# Patient Record
Sex: Female | Born: 1980 | Race: Black or African American | Hispanic: No | Marital: Single | Smoking: Former smoker
Health system: Southern US, Community
[De-identification: ages and names within clinical notes are randomized; demographics above are authoritative.]

## PROBLEM LIST (undated history)

## (undated) DIAGNOSIS — M1990 Unspecified osteoarthritis, unspecified site: Secondary | ICD-10-CM

## (undated) DIAGNOSIS — E559 Vitamin D deficiency, unspecified: Secondary | ICD-10-CM

## (undated) DIAGNOSIS — F32A Depression, unspecified: Secondary | ICD-10-CM

## (undated) DIAGNOSIS — N63 Unspecified lump in unspecified breast: Secondary | ICD-10-CM

## (undated) DIAGNOSIS — Z789 Other specified health status: Secondary | ICD-10-CM

## (undated) DIAGNOSIS — F419 Anxiety disorder, unspecified: Secondary | ICD-10-CM

## (undated) DIAGNOSIS — K259 Gastric ulcer, unspecified as acute or chronic, without hemorrhage or perforation: Secondary | ICD-10-CM

## (undated) DIAGNOSIS — F319 Bipolar disorder, unspecified: Secondary | ICD-10-CM

## (undated) HISTORY — DX: Unspecified osteoarthritis, unspecified site: M19.90

## (undated) HISTORY — DX: Bipolar disorder, unspecified: F31.9

## (undated) HISTORY — DX: Anxiety disorder, unspecified: F41.9

## (undated) HISTORY — PX: CHOLECYSTECTOMY: SHX55

## (undated) HISTORY — DX: Depression, unspecified: F32.A

## (undated) HISTORY — DX: Vitamin D deficiency, unspecified: E55.9

---
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
## 1998-08-17 ENCOUNTER — Inpatient Hospital Stay (HOSPITAL_COMMUNITY): Admission: AD | Admit: 1998-08-17 | Discharge: 1998-08-17 | Payer: Self-pay | Admitting: Obstetrics & Gynecology

## 1998-09-07 ENCOUNTER — Ambulatory Visit (HOSPITAL_COMMUNITY): Admission: RE | Admit: 1998-09-07 | Discharge: 1998-09-07 | Payer: Self-pay | Admitting: Obstetrics

## 1998-10-16 ENCOUNTER — Ambulatory Visit (HOSPITAL_COMMUNITY): Admission: RE | Admit: 1998-10-16 | Discharge: 1998-10-16 | Payer: Self-pay | Admitting: Obstetrics

## 1998-11-23 ENCOUNTER — Inpatient Hospital Stay (HOSPITAL_COMMUNITY): Admission: AD | Admit: 1998-11-23 | Discharge: 1998-11-23 | Payer: Self-pay | Admitting: Obstetrics & Gynecology

## 1998-12-11 ENCOUNTER — Ambulatory Visit (HOSPITAL_COMMUNITY): Admission: RE | Admit: 1998-12-11 | Discharge: 1998-12-11 | Payer: Self-pay | Admitting: *Deleted

## 1999-01-21 ENCOUNTER — Inpatient Hospital Stay (HOSPITAL_COMMUNITY): Admission: AD | Admit: 1999-01-21 | Discharge: 1999-01-21 | Payer: Self-pay | Admitting: *Deleted

## 1999-01-24 ENCOUNTER — Inpatient Hospital Stay (HOSPITAL_COMMUNITY): Admission: AD | Admit: 1999-01-24 | Discharge: 1999-01-24 | Payer: Self-pay | Admitting: Obstetrics

## 1999-03-16 ENCOUNTER — Inpatient Hospital Stay (HOSPITAL_COMMUNITY): Admission: AD | Admit: 1999-03-16 | Discharge: 1999-03-19 | Payer: Self-pay | Admitting: Obstetrics & Gynecology

## 1999-05-01 ENCOUNTER — Encounter: Admission: RE | Admit: 1999-05-01 | Discharge: 1999-05-01 | Payer: Self-pay | Admitting: Family Medicine

## 1999-07-31 ENCOUNTER — Encounter: Admission: RE | Admit: 1999-07-31 | Discharge: 1999-07-31 | Payer: Self-pay | Admitting: Sports Medicine

## 1999-08-20 ENCOUNTER — Encounter: Admission: RE | Admit: 1999-08-20 | Discharge: 1999-08-20 | Payer: Self-pay | Admitting: Family Medicine

## 1999-09-18 ENCOUNTER — Encounter: Admission: RE | Admit: 1999-09-18 | Discharge: 1999-09-18 | Payer: Self-pay | Admitting: Sports Medicine

## 1999-09-20 ENCOUNTER — Encounter: Admission: RE | Admit: 1999-09-20 | Discharge: 1999-09-20 | Payer: Self-pay | Admitting: Family Medicine

## 1999-10-02 ENCOUNTER — Encounter: Admission: RE | Admit: 1999-10-02 | Discharge: 1999-10-02 | Payer: Self-pay | Admitting: Sports Medicine

## 1999-10-26 ENCOUNTER — Emergency Department (HOSPITAL_COMMUNITY): Admission: EM | Admit: 1999-10-26 | Discharge: 1999-10-26 | Payer: Self-pay | Admitting: Emergency Medicine

## 2000-01-03 ENCOUNTER — Encounter: Payer: Self-pay | Admitting: Emergency Medicine

## 2000-01-03 ENCOUNTER — Emergency Department (HOSPITAL_COMMUNITY): Admission: EM | Admit: 2000-01-03 | Discharge: 2000-01-03 | Payer: Self-pay | Admitting: Emergency Medicine

## 2000-04-07 ENCOUNTER — Emergency Department (HOSPITAL_COMMUNITY): Admission: EM | Admit: 2000-04-07 | Discharge: 2000-04-07 | Payer: Self-pay

## 2000-04-10 ENCOUNTER — Encounter (INDEPENDENT_AMBULATORY_CARE_PROVIDER_SITE_OTHER): Payer: Self-pay

## 2000-04-10 ENCOUNTER — Encounter (HOSPITAL_BASED_OUTPATIENT_CLINIC_OR_DEPARTMENT_OTHER): Payer: Self-pay | Admitting: General Surgery

## 2000-04-10 ENCOUNTER — Observation Stay (HOSPITAL_COMMUNITY): Admission: RE | Admit: 2000-04-10 | Discharge: 2000-04-11 | Payer: Self-pay | Admitting: General Surgery

## 2000-08-30 ENCOUNTER — Emergency Department (HOSPITAL_COMMUNITY): Admission: EM | Admit: 2000-08-30 | Discharge: 2000-08-30 | Payer: Self-pay | Admitting: Emergency Medicine

## 2000-10-07 ENCOUNTER — Encounter: Admission: RE | Admit: 2000-10-07 | Discharge: 2000-10-07 | Payer: Self-pay | Admitting: Family Medicine

## 2000-10-18 ENCOUNTER — Emergency Department (HOSPITAL_COMMUNITY): Admission: EM | Admit: 2000-10-18 | Discharge: 2000-10-18 | Payer: Self-pay | Admitting: Emergency Medicine

## 2000-10-21 ENCOUNTER — Encounter: Admission: RE | Admit: 2000-10-21 | Discharge: 2000-10-21 | Payer: Self-pay | Admitting: Family Medicine

## 2000-10-31 ENCOUNTER — Other Ambulatory Visit: Admission: RE | Admit: 2000-10-31 | Discharge: 2000-10-31 | Payer: Self-pay | Admitting: *Deleted

## 2000-10-31 ENCOUNTER — Encounter: Admission: RE | Admit: 2000-10-31 | Discharge: 2000-10-31 | Payer: Self-pay | Admitting: Family Medicine

## 2000-11-21 ENCOUNTER — Ambulatory Visit (HOSPITAL_COMMUNITY): Admission: RE | Admit: 2000-11-21 | Discharge: 2000-11-21 | Payer: Self-pay

## 2000-12-03 ENCOUNTER — Encounter: Admission: RE | Admit: 2000-12-03 | Discharge: 2000-12-03 | Payer: Self-pay | Admitting: Family Medicine

## 2000-12-04 ENCOUNTER — Encounter: Admission: RE | Admit: 2000-12-04 | Discharge: 2000-12-04 | Payer: Self-pay | Admitting: Family Medicine

## 2001-01-06 ENCOUNTER — Encounter: Admission: RE | Admit: 2001-01-06 | Discharge: 2001-01-06 | Payer: Self-pay | Admitting: Sports Medicine

## 2001-02-03 ENCOUNTER — Encounter: Admission: RE | Admit: 2001-02-03 | Discharge: 2001-02-03 | Payer: Self-pay | Admitting: Sports Medicine

## 2001-02-18 ENCOUNTER — Encounter: Admission: RE | Admit: 2001-02-18 | Discharge: 2001-02-18 | Payer: Self-pay | Admitting: Family Medicine

## 2001-02-27 ENCOUNTER — Ambulatory Visit (HOSPITAL_COMMUNITY): Admission: RE | Admit: 2001-02-27 | Discharge: 2001-02-27 | Payer: Self-pay | Admitting: Family Medicine

## 2001-03-18 ENCOUNTER — Encounter: Admission: RE | Admit: 2001-03-18 | Discharge: 2001-03-18 | Payer: Self-pay | Admitting: Family Medicine

## 2001-03-30 ENCOUNTER — Encounter: Admission: RE | Admit: 2001-03-30 | Discharge: 2001-03-30 | Payer: Self-pay | Admitting: Family Medicine

## 2001-04-10 ENCOUNTER — Encounter: Admission: RE | Admit: 2001-04-10 | Discharge: 2001-04-10 | Payer: Self-pay | Admitting: Family Medicine

## 2001-04-23 ENCOUNTER — Encounter: Admission: RE | Admit: 2001-04-23 | Discharge: 2001-04-23 | Payer: Self-pay | Admitting: Family Medicine

## 2001-05-05 ENCOUNTER — Inpatient Hospital Stay (HOSPITAL_COMMUNITY): Admission: AD | Admit: 2001-05-05 | Discharge: 2001-05-07 | Payer: Self-pay | Admitting: *Deleted

## 2001-05-21 ENCOUNTER — Encounter: Admission: RE | Admit: 2001-05-21 | Discharge: 2001-05-21 | Payer: Self-pay | Admitting: Family Medicine

## 2001-06-18 ENCOUNTER — Encounter: Admission: RE | Admit: 2001-06-18 | Discharge: 2001-06-18 | Payer: Self-pay | Admitting: Family Medicine

## 2001-07-02 ENCOUNTER — Encounter: Admission: RE | Admit: 2001-07-02 | Discharge: 2001-07-02 | Payer: Self-pay | Admitting: Family Medicine

## 2001-07-31 ENCOUNTER — Encounter: Admission: RE | Admit: 2001-07-31 | Discharge: 2001-07-31 | Payer: Self-pay | Admitting: Family Medicine

## 2001-08-10 ENCOUNTER — Encounter: Admission: RE | Admit: 2001-08-10 | Discharge: 2001-08-10 | Payer: Self-pay | Admitting: Family Medicine

## 2001-10-23 ENCOUNTER — Encounter: Admission: RE | Admit: 2001-10-23 | Discharge: 2001-10-23 | Payer: Self-pay | Admitting: Family Medicine

## 2001-11-17 ENCOUNTER — Encounter: Admission: RE | Admit: 2001-11-17 | Discharge: 2001-11-17 | Payer: Self-pay | Admitting: Family Medicine

## 2001-12-04 ENCOUNTER — Encounter: Admission: RE | Admit: 2001-12-04 | Discharge: 2001-12-04 | Payer: Self-pay | Admitting: Family Medicine

## 2002-02-11 ENCOUNTER — Encounter: Admission: RE | Admit: 2002-02-11 | Discharge: 2002-02-11 | Payer: Self-pay | Admitting: Family Medicine

## 2002-03-03 ENCOUNTER — Emergency Department (HOSPITAL_COMMUNITY): Admission: EM | Admit: 2002-03-03 | Discharge: 2002-03-03 | Payer: Self-pay | Admitting: Emergency Medicine

## 2002-06-07 ENCOUNTER — Emergency Department (HOSPITAL_COMMUNITY): Admission: EM | Admit: 2002-06-07 | Discharge: 2002-06-08 | Payer: Self-pay | Admitting: Emergency Medicine

## 2002-06-08 ENCOUNTER — Encounter: Payer: Self-pay | Admitting: Emergency Medicine

## 2002-06-14 ENCOUNTER — Encounter: Admission: RE | Admit: 2002-06-14 | Discharge: 2002-06-14 | Payer: Self-pay | Admitting: Family Medicine

## 2002-11-22 ENCOUNTER — Encounter: Payer: Self-pay | Admitting: Emergency Medicine

## 2002-11-22 ENCOUNTER — Emergency Department (HOSPITAL_COMMUNITY): Admission: EM | Admit: 2002-11-22 | Discharge: 2002-11-22 | Payer: Self-pay | Admitting: Emergency Medicine

## 2003-12-28 ENCOUNTER — Other Ambulatory Visit: Admission: RE | Admit: 2003-12-28 | Discharge: 2003-12-28 | Payer: Self-pay | Admitting: Obstetrics and Gynecology

## 2004-09-09 HISTORY — PX: TUBAL LIGATION: SHX77

## 2005-02-17 ENCOUNTER — Emergency Department (HOSPITAL_COMMUNITY): Admission: EM | Admit: 2005-02-17 | Discharge: 2005-02-17 | Payer: Self-pay | Admitting: Emergency Medicine

## 2005-03-01 ENCOUNTER — Emergency Department (HOSPITAL_COMMUNITY): Admission: EM | Admit: 2005-03-01 | Discharge: 2005-03-01 | Payer: Self-pay | Admitting: Emergency Medicine

## 2005-03-20 ENCOUNTER — Inpatient Hospital Stay (HOSPITAL_COMMUNITY): Admission: AD | Admit: 2005-03-20 | Discharge: 2005-03-20 | Payer: Self-pay | Admitting: Obstetrics and Gynecology

## 2005-03-27 ENCOUNTER — Ambulatory Visit (HOSPITAL_COMMUNITY): Admission: RE | Admit: 2005-03-27 | Discharge: 2005-03-27 | Payer: Self-pay | Admitting: Obstetrics and Gynecology

## 2005-03-27 ENCOUNTER — Ambulatory Visit: Payer: Self-pay | Admitting: Obstetrics and Gynecology

## 2005-03-27 ENCOUNTER — Encounter (INDEPENDENT_AMBULATORY_CARE_PROVIDER_SITE_OTHER): Payer: Self-pay | Admitting: *Deleted

## 2005-06-12 ENCOUNTER — Ambulatory Visit (HOSPITAL_COMMUNITY): Admission: RE | Admit: 2005-06-12 | Discharge: 2005-06-12 | Payer: Self-pay | Admitting: Obstetrics

## 2005-11-21 ENCOUNTER — Emergency Department (HOSPITAL_COMMUNITY): Admission: EM | Admit: 2005-11-21 | Discharge: 2005-11-21 | Payer: Self-pay | Admitting: Emergency Medicine

## 2006-02-25 ENCOUNTER — Emergency Department (HOSPITAL_COMMUNITY): Admission: EM | Admit: 2006-02-25 | Discharge: 2006-02-25 | Payer: Self-pay | Admitting: Emergency Medicine

## 2006-04-02 ENCOUNTER — Emergency Department (HOSPITAL_COMMUNITY): Admission: EM | Admit: 2006-04-02 | Discharge: 2006-04-02 | Payer: Self-pay | Admitting: Family Medicine

## 2006-05-26 ENCOUNTER — Emergency Department (HOSPITAL_COMMUNITY): Admission: EM | Admit: 2006-05-26 | Discharge: 2006-05-26 | Payer: Self-pay | Admitting: Emergency Medicine

## 2006-10-05 ENCOUNTER — Emergency Department (HOSPITAL_COMMUNITY): Admission: EM | Admit: 2006-10-05 | Discharge: 2006-10-05 | Payer: Self-pay | Admitting: Family Medicine

## 2008-03-16 ENCOUNTER — Emergency Department (HOSPITAL_COMMUNITY): Admission: EM | Admit: 2008-03-16 | Discharge: 2008-03-16 | Payer: Self-pay | Admitting: Emergency Medicine

## 2008-03-18 ENCOUNTER — Emergency Department (HOSPITAL_COMMUNITY): Admission: EM | Admit: 2008-03-18 | Discharge: 2008-03-18 | Payer: Self-pay | Admitting: Emergency Medicine

## 2008-03-22 ENCOUNTER — Encounter: Admission: RE | Admit: 2008-03-22 | Discharge: 2008-03-22 | Payer: Self-pay | Admitting: Pediatrics

## 2009-03-29 ENCOUNTER — Emergency Department (HOSPITAL_COMMUNITY): Admission: EM | Admit: 2009-03-29 | Discharge: 2009-03-29 | Payer: Self-pay | Admitting: Emergency Medicine

## 2010-10-11 ENCOUNTER — Emergency Department (HOSPITAL_COMMUNITY): Payer: Medicaid Other

## 2010-10-11 ENCOUNTER — Emergency Department (HOSPITAL_COMMUNITY)
Admission: EM | Admit: 2010-10-11 | Discharge: 2010-10-12 | Disposition: A | Discharge status: Home / Self Care | Payer: Medicaid Other | Attending: Emergency Medicine | Admitting: Emergency Medicine

## 2010-10-11 DIAGNOSIS — H6120 Impacted cerumen, unspecified ear: Secondary | ICD-10-CM | POA: Insufficient documentation

## 2010-10-11 DIAGNOSIS — J189 Pneumonia, unspecified organism: Secondary | ICD-10-CM | POA: Insufficient documentation

## 2010-10-11 DIAGNOSIS — H6690 Otitis media, unspecified, unspecified ear: Secondary | ICD-10-CM | POA: Insufficient documentation

## 2010-10-11 DIAGNOSIS — R059 Cough, unspecified: Secondary | ICD-10-CM | POA: Insufficient documentation

## 2010-10-11 DIAGNOSIS — R05 Cough: Secondary | ICD-10-CM | POA: Insufficient documentation

## 2010-10-11 DIAGNOSIS — R509 Fever, unspecified: Secondary | ICD-10-CM | POA: Insufficient documentation

## 2010-10-11 LAB — POCT PREGNANCY, URINE: Preg Test, Ur: NEGATIVE

## 2010-10-12 LAB — URINE MICROSCOPIC-ADD ON

## 2010-10-12 LAB — URINALYSIS, ROUTINE W REFLEX MICROSCOPIC
Ketones, ur: NEGATIVE mg/dL
Nitrite: POSITIVE — AB
Specific Gravity, Urine: 1.043 — ABNORMAL HIGH (ref 1.005–1.030)

## 2010-10-13 ENCOUNTER — Emergency Department (HOSPITAL_COMMUNITY)
Admission: EM | Admit: 2010-10-13 | Discharge: 2010-10-13 | Disposition: A | Discharge status: Home / Self Care | Payer: Medicaid Other | Attending: Emergency Medicine | Admitting: Emergency Medicine

## 2010-10-13 ENCOUNTER — Emergency Department (HOSPITAL_COMMUNITY): Payer: Medicaid Other

## 2010-10-13 DIAGNOSIS — R05 Cough: Secondary | ICD-10-CM | POA: Insufficient documentation

## 2010-10-13 DIAGNOSIS — R059 Cough, unspecified: Secondary | ICD-10-CM | POA: Insufficient documentation

## 2010-10-13 DIAGNOSIS — K219 Gastro-esophageal reflux disease without esophagitis: Secondary | ICD-10-CM | POA: Insufficient documentation

## 2010-10-13 DIAGNOSIS — R197 Diarrhea, unspecified: Secondary | ICD-10-CM | POA: Insufficient documentation

## 2010-10-13 LAB — BASIC METABOLIC PANEL
CO2: 24 mEq/L (ref 19–32)
Calcium: 9.2 mg/dL (ref 8.4–10.5)
GFR calc Af Amer: >60 mL/min (ref >60)
GFR calc non Af Amer: >60 mL/min (ref >60)
Glucose, Bld: 106 mg/dL — ABNORMAL HIGH (ref 70–99)
Potassium: 3.5 mEq/L (ref 3.5–5.1)
Sodium: 137 mEq/L (ref 135–145)

## 2010-10-13 LAB — URINE CULTURE: Culture  Setup Time: 201202030512

## 2010-10-13 LAB — CBC
HCT: 34.6 % — ABNORMAL LOW (ref 36.0–46.0)
RBC: 4.24 MIL/uL (ref 3.87–5.11)
RDW: 13.6 % (ref 11.5–15.5)

## 2010-10-13 LAB — DIFFERENTIAL
Basophils Absolute: 0 10*3/uL (ref 0.0–0.1)
Basophils Relative: 1 % (ref 0–1)
Eosinophils Absolute: 0.3 10*3/uL (ref 0.0–0.7)
Eosinophils Relative: 4 % (ref 0–5)
Monocytes Relative: 6 % (ref 3–12)

## 2010-10-20 ENCOUNTER — Emergency Department (HOSPITAL_COMMUNITY): Payer: Medicaid Other

## 2010-10-20 ENCOUNTER — Emergency Department (HOSPITAL_COMMUNITY)
Admission: EM | Admit: 2010-10-20 | Discharge: 2010-10-20 | Disposition: A | Discharge status: Home / Self Care | Payer: Medicaid Other | Attending: Emergency Medicine | Admitting: Emergency Medicine

## 2010-10-20 DIAGNOSIS — R509 Fever, unspecified: Secondary | ICD-10-CM | POA: Insufficient documentation

## 2010-10-20 DIAGNOSIS — R0789 Other chest pain: Secondary | ICD-10-CM | POA: Insufficient documentation

## 2010-10-20 DIAGNOSIS — R05 Cough: Secondary | ICD-10-CM | POA: Insufficient documentation

## 2010-10-20 DIAGNOSIS — R059 Cough, unspecified: Secondary | ICD-10-CM | POA: Insufficient documentation

## 2010-10-20 DIAGNOSIS — J189 Pneumonia, unspecified organism: Secondary | ICD-10-CM | POA: Insufficient documentation

## 2010-10-20 LAB — URINALYSIS, ROUTINE W REFLEX MICROSCOPIC
Ketones, ur: NEGATIVE mg/dL
Leukocytes, UA: NEGATIVE
Nitrite: NEGATIVE
Specific Gravity, Urine: 1.042 — ABNORMAL HIGH (ref 1.005–1.030)
Urobilinogen, UA: 0.2 mg/dL (ref 0.0–1.0)
pH: 6 (ref 5.0–8.0)

## 2010-10-20 LAB — COMPREHENSIVE METABOLIC PANEL
ALT: 22 U/L (ref 0–35)
Albumin: 3.5 g/dL (ref 3.5–5.2)
Alkaline Phosphatase: 85 U/L (ref 39–117)
Calcium: 9.6 mg/dL (ref 8.4–10.5)
GFR calc non Af Amer: >60 mL/min (ref >60)
Glucose, Bld: 76 mg/dL (ref 70–99)
Potassium: 3.7 mEq/L (ref 3.5–5.1)
Sodium: 139 mEq/L (ref 135–145)
Total Protein: 8.2 g/dL (ref 6.0–8.3)

## 2010-10-20 LAB — DIFFERENTIAL
Basophils Relative: 0 % (ref 0–1)
Eosinophils Absolute: 0.2 10*3/uL (ref 0.0–0.7)
Neutro Abs: 5.2 10*3/uL (ref 1.7–7.7)

## 2010-10-20 LAB — CBC
Hemoglobin: 13.1 g/dL (ref 12.0–15.0)
MCHC: 33.6 g/dL (ref 30.0–36.0)
Platelets: 321 10*3/uL (ref 150–400)
RDW: 13.7 % (ref 11.5–15.5)

## 2010-10-20 LAB — URINE MICROSCOPIC-ADD ON

## 2011-01-25 NOTE — Op Note (Signed)
[facility]  Patient:    Linda Odom                       MRN: [id number] Proc. Date: 04/10/00 Adm. Date:  [id number] Attending:  Fortino Sic                           Operative Report  PREOPERATIVE DIAGNOSIS:  Symptomatic cholelithiasis.  POSTOPERATIVE DIAGNOSIS:  Symptomatic cholelithiasis.  OPERATION PERFORMED:  Laparoscopic cholecystectomy with intraoperative cholangiogram.  SURGEON:  Marnee Spring. Wiliam Ke, M.D.  ASSISTANT:  Mardene Celeste. Lurene Shadow, M.D.  ANESTHESIA:  Endotracheal by hospital.  DESCRIPTION OF PROCEDURE:  Under good endotracheal anesthesia, the abdomen was prepped and draped in the usual manner.  A transverse incision was made above the umbilicus and the abdomen was entered.  A pursestring suture was placed. Hassan cannula was inserted and pneumoperitoneum was obtained.  The peritoneal surfaces appeared normal.  The gallbladder was somewhat distended, but not acutely inflamed.  A 10 mm and two 5 mm cannulas were placed in the usual fashion, dissecting the porta hepatis.  The cystic duct and gallbladder junction was identified.  The cystic artery was identified.  The cystic artery was triply clipped and cut, _______.  The cystic duct was clipped at the gallbladder side.  The cystic duct was opened.  The Reddick catheter was inserted and a cholangiogram was performed using a fluoroscopic technique.  We believed there was ________ up in the right hepatic duct, but otherwise there was good flow and no dilatation.  The cystic duct was triply clipped and cut ________.  The gallbladder was removed from the liver bed with electrocautery current and hemostasis was obtained with electrocautery current.  The gallbladder was removed via the umbilical port in the usual fashion.  The right upper quadrant was copiously irrigated with saline and sucked dry.  Hemostasis was excellent.  The cannula was removed.  The pursestring suture was tied.  The  wounds were closed with subcuticular 4-0 Dexon and Steri-Strips were applied.  Estimated blood loss minimal.  The patient received no blood and left the operating room in satisfactory condition after sponge and needle counts were verified. DD:  04/10/00 TD:  04/11/00 Job: [id number]

## 2011-01-25 NOTE — Discharge Summary (Signed)
[facility]  Patient:    Linda Odom, Linda Odom                      MRN: [id number] Adm. Date:  [id number] Disc. Date: [id number] Attending:  Fortino Sic                           Discharge Summary  ADMISSION DIAGNOSIS:  Symptomatic cholelithiasis.  DISCHARGE DIAGNOSIS:  Symptomatic cholelithiasis.  OPERATIONS:  April 10, 2000, laparoscopic cholecystectomy and intraoperative cholangiogram.  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 Admission laboratory work revealed a slightly elevated SGOT.  HOSPITAL COURSE:  The patient was admitted postoperatively.  Postoperative course was benign.  At the time of discharge up and about, tolerating a diet.  CONDITION ON DISCHARGE:  Stable.  DIET:  As tolerated.  ACTIVITY:  No lifting or calisthenics.  DISCHARGE MEDICATIONS:  Vicodin.  FOLLOW-UP:  She is to see me in 10 days for wound care. DD:  04/11/00 TD:  04/13/00 Job: [id number]

## 2011-01-25 NOTE — Op Note (Signed)
NAME:  Linda Odom, Linda Odom NO.:  [id number]   MEDICAL RECORD NO.:  [id number]          PATIENT TYPE:  AMB   LOCATION:  SDC                           FACILITY:  [facility]   PHYSICIAN:  Kathreen Cosier, M.D.DATE OF BIRTH:  05-24-1981   DATE OF PROCEDURE:  06/12/2005  DATE OF DISCHARGE:                                 OPERATIVE REPORT   PREOPERATIVE DIAGNOSES:  Multiparity.   POSTOPERATIVE DIAGNOSES:  Multiparity.   PROCEDURE:  Open laparoscopic tubal sterilization.   Under general anesthesia with the patient in lithotomy position, the  abdomen, perineum an vagina prepped and draped, bladder emptied with a  straight catheter. A weighted speculum placed in the vagina and a hulka  tenaculum placed in the cervix. In the umbilicus, a transverse incision  made, carried down to the fascia. The fascia was clean, grasped with 2  Kocher's and the fascia opened with the Mayo scissors. The peritoneum opened  with a Tresa Endo. The sleeve of the trocar inserted intraperitoneally. Three  liters of carbon dioxide infused intraperitoneally, __________ scope was  inserted. The uterus, tubes and ovaries were normal. The cautery probe  inserted through the sleeve of the scope and the right tube grasped 1 inch  from the cornu and cauterized. The tube was cauterized in a total of 4  places moving lateral from the first site of cautery. The procedure was done  in the exact fashion on the other side. Both fimbria were identified. CO2  allowed to escape from the peritoneal cavity, fascia closed with 1 stitch of  #0 Dexon and the skin closed with subcuticular stitch of 4-0 Monocryl. Blood  loss was minimal. The patient tolerated the procedure well and was taken to  the recovery room in good condition.           ______________________________  Kathreen Cosier, M.D.     BAM/MEDQ  D:  06/12/2005  T:  06/12/2005  Job:  [id number]

## 2011-01-25 NOTE — Op Note (Signed)
NAME:  Linda Odom, Linda Odom               ACCOUNT NO.:  [id number]   MEDICAL RECORD NO.:  [id number]          PATIENT TYPE:  AMB   LOCATION:  SDC                           [facility]ILITY:  [facility]   PHYSICIAN:  Phil D. Okey Dupre, M.D.     DATE OF BIRTH:  1980/11/29   DATE OF PROCEDURE:  03/27/2005  DATE OF DISCHARGE:                                 OPERATIVE REPORT   PROCEDURE:  Dilatation and evacuation.   PREOPERATIVE DIAGNOSIS:  Missed abortion.   POSTOPERATIVE DIAGNOSIS:  Missed abortion.   SURGEON:  Javier Glazier. Okey Dupre, M.D.   ANESTHESIA:  MAC plus local.   SPECIMENS TO PATHOLOGY:  Products of conception.   ESTIMATED BLOOD LOSS:  Less than 5 mL.   PROCEDURE:  Under satisfactory MAC sedation with the patient in a dorsal  lithotomy position, the perineum and vagina prepped and draped in the usual  sterile manner.  Bimanual pelvic examination revealed a uterus of about  eight weeks' size, anterior flexed, freely movable.  Adnexa could not be  well-outlined because of the habitus of the patient.  A weighted speculum  was placed in the posterior fourchette of the vagina through a marital  introitus.  BUS was within normal limits.  The anterior lip of a parous  cervix was grasped with a single-tooth tenaculum.  The uterine cavity was  sounded to a depth of 9 cm, the cervical os dilated to a #7 Hegar dilator,  and a #7 suction curved curette was used to evacuate the uterine contents  without incident.  The tenaculum was __________ and removed from the vagina,  the patient transferred to the recovery room in satisfactory condition.       PDR/MEDQ  D:  03/27/2005  T:  03/27/2005  Job:  [id number]

## 2011-06-06 LAB — URINE MICROSCOPIC-ADD ON

## 2011-06-06 LAB — COMPREHENSIVE METABOLIC PANEL
ALT: 157 — ABNORMAL HIGH
AST: 138 — ABNORMAL HIGH
Albumin: 3 — ABNORMAL LOW
BUN: 5 — ABNORMAL LOW
Calcium: 8.9
GFR calc Af Amer: >60
Potassium: 3.5
Total Bilirubin: 0.5

## 2011-06-06 LAB — DIFFERENTIAL
Basophils Absolute: 0.2 — ABNORMAL HIGH
Eosinophils Absolute: 0
Lymphocytes Relative: 73 — ABNORMAL HIGH
Lymphs Abs: 4.9 — ABNORMAL HIGH
Neutro Abs: 0.9 — ABNORMAL LOW

## 2011-06-06 LAB — URINALYSIS, ROUTINE W REFLEX MICROSCOPIC
Glucose, UA: NEGATIVE
Ketones, ur: NEGATIVE
Specific Gravity, Urine: 1.029

## 2011-06-06 LAB — CBC
HCT: 35.8 — ABNORMAL LOW
Hemoglobin: 12
MCHC: 33.6
MCV: 79.9

## 2011-06-06 LAB — POCT PREGNANCY, URINE: Preg Test, Ur: NEGATIVE

## 2011-09-24 ENCOUNTER — Emergency Department (HOSPITAL_COMMUNITY)
Admission: EM | Admit: 2011-09-24 | Discharge: 2011-09-24 | Disposition: A | Discharge status: Home / Self Care | Payer: Medicaid Other | Attending: Emergency Medicine | Admitting: Emergency Medicine

## 2011-09-24 ENCOUNTER — Emergency Department (HOSPITAL_COMMUNITY): Admission: EM | Admit: 2011-09-24 | Payer: Self-pay | Source: Home / Self Care

## 2011-09-24 ENCOUNTER — Emergency Department (HOSPITAL_COMMUNITY): Payer: Medicaid Other

## 2011-09-24 ENCOUNTER — Encounter (HOSPITAL_COMMUNITY): Payer: Self-pay

## 2011-09-24 DIAGNOSIS — S93609A Unspecified sprain of unspecified foot, initial encounter: Secondary | ICD-10-CM | POA: Insufficient documentation

## 2011-09-24 DIAGNOSIS — S93409A Sprain of unspecified ligament of unspecified ankle, initial encounter: Secondary | ICD-10-CM | POA: Insufficient documentation

## 2011-09-24 DIAGNOSIS — S93602A Unspecified sprain of left foot, initial encounter: Secondary | ICD-10-CM

## 2011-09-24 DIAGNOSIS — S93402A Sprain of unspecified ligament of left ankle, initial encounter: Secondary | ICD-10-CM

## 2011-09-24 DIAGNOSIS — W010XXA Fall on same level from slipping, tripping and stumbling without subsequent striking against object, initial encounter: Secondary | ICD-10-CM | POA: Insufficient documentation

## 2011-09-24 MED ORDER — HYDROCODONE-ACETAMINOPHEN 5-325 MG PO TABS: 1.0000 | ORAL_TABLET | ORAL | Status: AC | PRN

## 2011-09-24 MED ORDER — HYDROCODONE-ACETAMINOPHEN 5-325 MG PO TABS
1.0000 | ORAL_TABLET | Freq: Once | ORAL | Status: AC
  Administered 2011-09-24: 1 via ORAL
  Filled 2011-09-24: qty 1

## 2011-09-24 NOTE — ED Provider Notes (Signed)
History     CSN: [id number]  Arrival date & time 09/24/11  0006   First MD Initiated Contact with Patient 09/24/11 0113      No chief complaint on file.   (Consider location/radiation/quality/duration/timing/severity/associated sxs/prior treatment) HPI Comments: Patient here after slipping and falling this evening on the wet concrete - reports left ankle and foot pain - has not been able to walk since then.  Patient is a 31 y.o. female presenting with ankle pain. The history is provided by the patient. No language interpreter was used.  Ankle Pain  The incident occurred 1 to 2 hours ago. The incident occurred at home. The injury mechanism was a fall. The pain is present in the left ankle and left foot. The quality of the pain is described as aching. The pain is at a severity of 10/10. The pain is severe. The pain has been constant since onset. Associated symptoms include inability to bear weight. Pertinent negatives include no numbness, no loss of motion, no muscle weakness, no loss of sensation and no tingling. She reports no foreign bodies present. The symptoms are aggravated by nothing. She has tried nothing for the symptoms. The treatment provided no relief.    History reviewed. No pertinent past medical history.  History reviewed. No pertinent past surgical history.  History reviewed. No pertinent family history.  History  Substance Use Topics  . Smoking status: Current Everyday Smoker  . Smokeless tobacco: Not on file  . Alcohol Use: Yes    OB History    Grav Para Term Preterm Abortions TAB SAB Ect Mult Living                  Review of Systems  Musculoskeletal: Positive for joint swelling, arthralgias and gait problem. Negative for myalgias and back pain.  Neurological: Negative for tingling and numbness.  All other systems reviewed and are negative.    Allergies  Review of patient's allergies indicates no known allergies.  Home Medications  No current  outpatient prescriptions on file.  BP 121/77  Pulse 74  Temp(Src) 98.9 F (37.2 C) (Oral)  Resp 20  SpO2 100%  Physical Exam  Nursing note and vitals reviewed. Constitutional: She is oriented to person, place, and time. She appears well-developed and well-nourished. No distress.  HENT:  Head: Normocephalic and atraumatic.  Right Ear: External ear normal.  Left Ear: External ear normal.  Nose: Nose normal.  Mouth/Throat: Oropharynx is clear and moist. No oropharyngeal exudate.  Eyes: Conjunctivae are normal. Pupils are equal, round, and reactive to light. No scleral icterus.  Neck: Normal range of motion.  Cardiovascular: Normal rate, regular rhythm and normal heart sounds.  Exam reveals no gallop and no friction rub.   No murmur heard. Pulmonary/Chest: Effort normal and breath sounds normal. No respiratory distress. She exhibits no tenderness.  Abdominal: Soft. Bowel sounds are normal. There is no tenderness.  Musculoskeletal:       Left ankle: She exhibits decreased range of motion and swelling. She exhibits no ecchymosis, no deformity and normal pulse. tenderness. Lateral malleolus and medial malleolus tenderness found. Achilles tendon normal.  Lymphadenopathy:    She has no cervical adenopathy.  Neurological: She is alert and oriented to person, place, and time. No cranial nerve deficit.  Skin: Skin is warm and dry.  Psychiatric: She has a normal mood and affect. Her behavior is normal. Judgment and thought content normal.    ED Course  Procedures (including critical care time)  Labs Reviewed -  No data to display Dg Foot Complete Left  09/24/2011  *RADIOLOGY REPORT*  Clinical Data: Fall, twisting injury, pain  LEFT FOOT - COMPLETE 3+ VIEW  Comparison: None.  Findings: Normal alignment without fracture.  Preserved joint spaces.  No radiographic swelling or foreign body.  IMPRESSION: No acute osseous finding  Original Report Authenticated By: Judie Petit. Ruel Favors, M.D.     Left  ankle sprain    MDM  No evidence of fracture - placed in left ASO and given crutches per orthotech - given short course pain medication.        Izola Price [place], [place] 09/24/11 [phone]

## 2011-09-24 NOTE — ED Provider Notes (Signed)
Medical screening examination/treatment/procedure(s) were performed by non-physician practitioner and as supervising physician I was immediately available for consultation/collaboration.   Genoa Freyre A. Sofiah Lyne, MD 09/24/11 2258 

## 2011-09-24 NOTE — ED Notes (Signed)
Pt slipped and fell and injured her left foot and ankle

## 2011-11-08 ENCOUNTER — Other Ambulatory Visit: Payer: Self-pay | Admitting: Obstetrics

## 2011-11-08 DIAGNOSIS — N63 Unspecified lump in unspecified breast: Secondary | ICD-10-CM

## 2011-11-18 ENCOUNTER — Other Ambulatory Visit: Payer: Self-pay

## 2011-12-12 ENCOUNTER — Ambulatory Visit
Admission: RE | Admit: 2011-12-12 | Discharge: 2011-12-12 | Disposition: A | Discharge status: Home / Self Care | Payer: Medicaid Other | Source: Ambulatory Visit | Attending: Obstetrics | Admitting: Obstetrics

## 2011-12-12 DIAGNOSIS — N63 Unspecified lump in unspecified breast: Secondary | ICD-10-CM

## 2012-02-11 IMAGING — CR DG CHEST 2V
2 series · 2 of 2 positions shown · non-contrast
Comparison: 10/11/2010

CLINICAL DATA: Cough

CHEST - 2 VIEW

[w chest pa *]
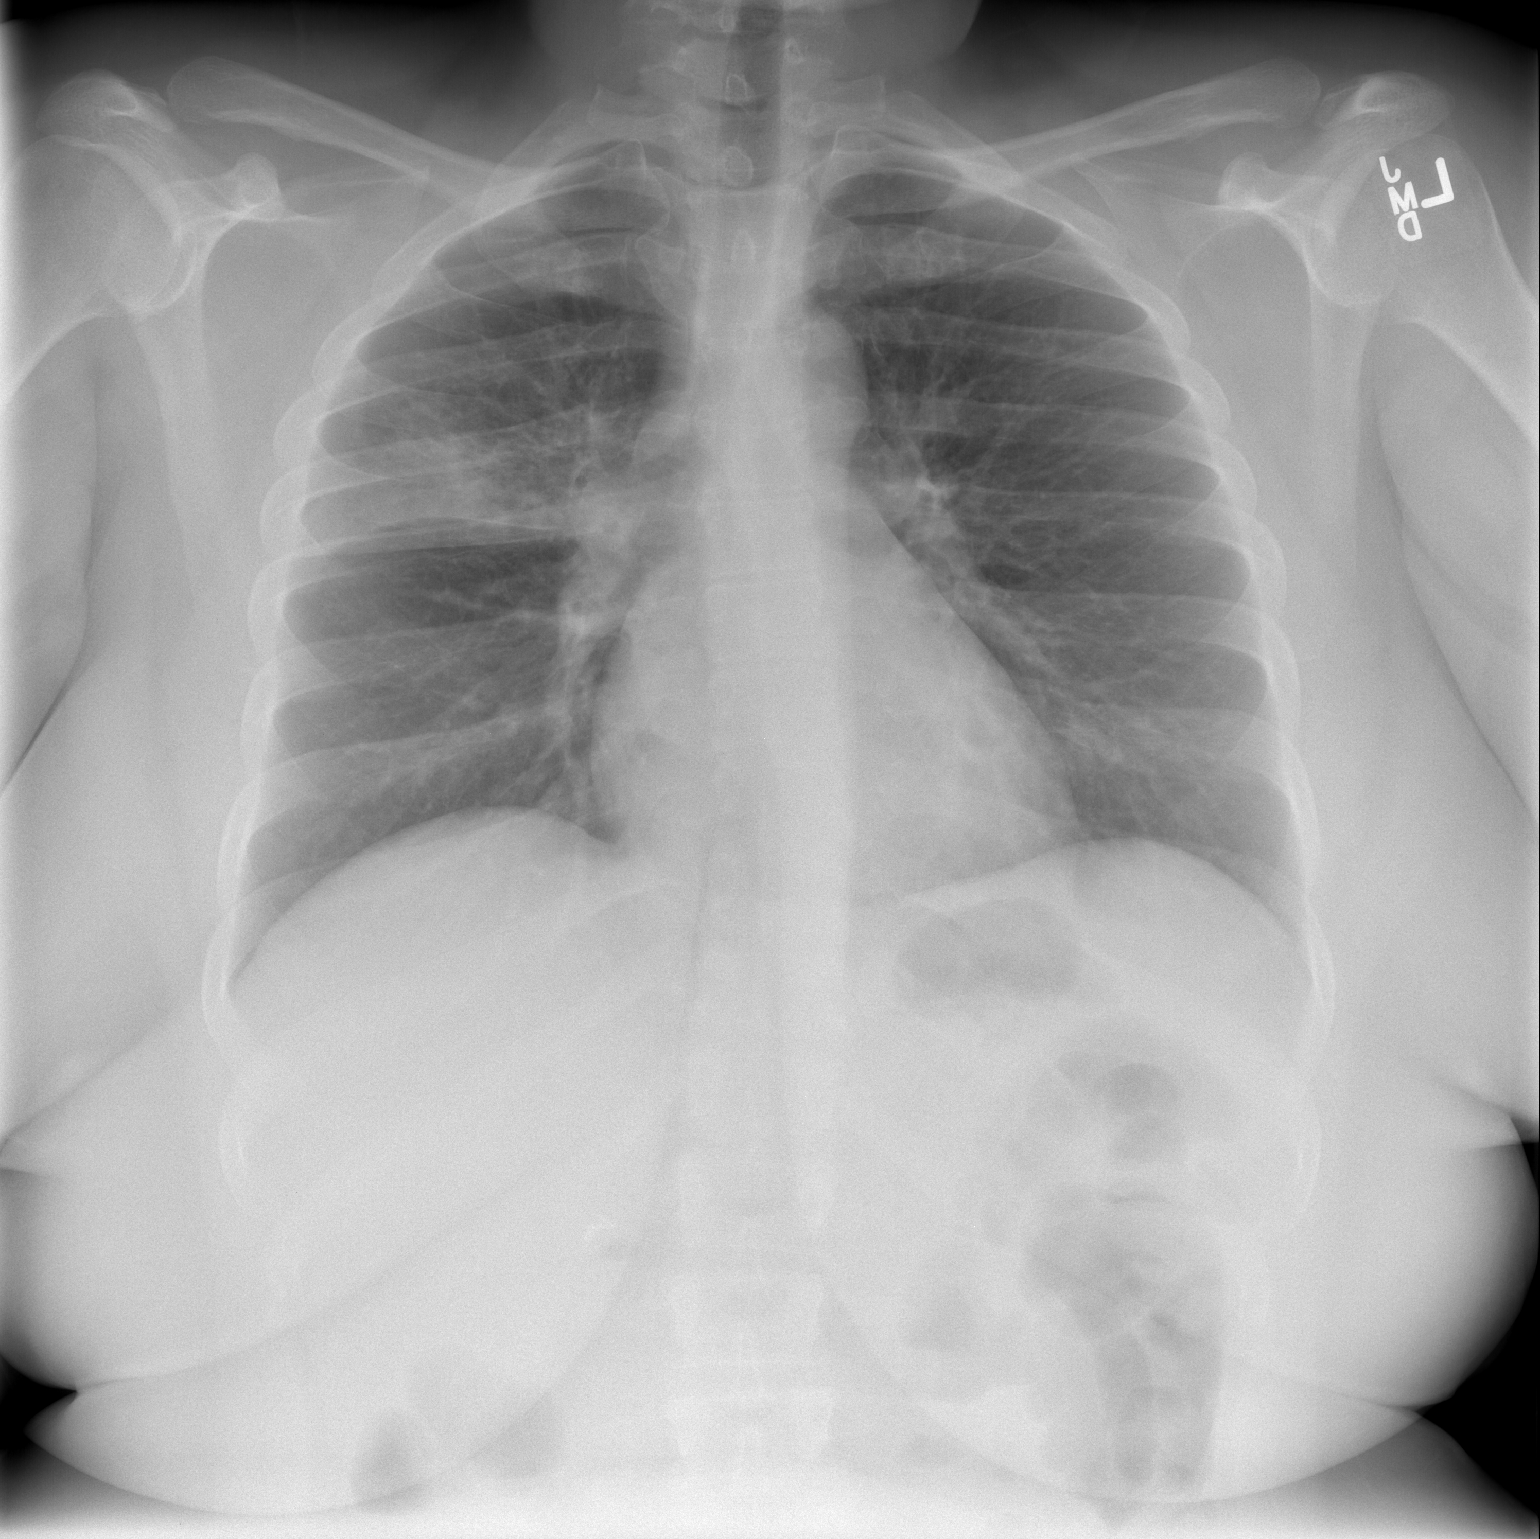

[w chest lat *]
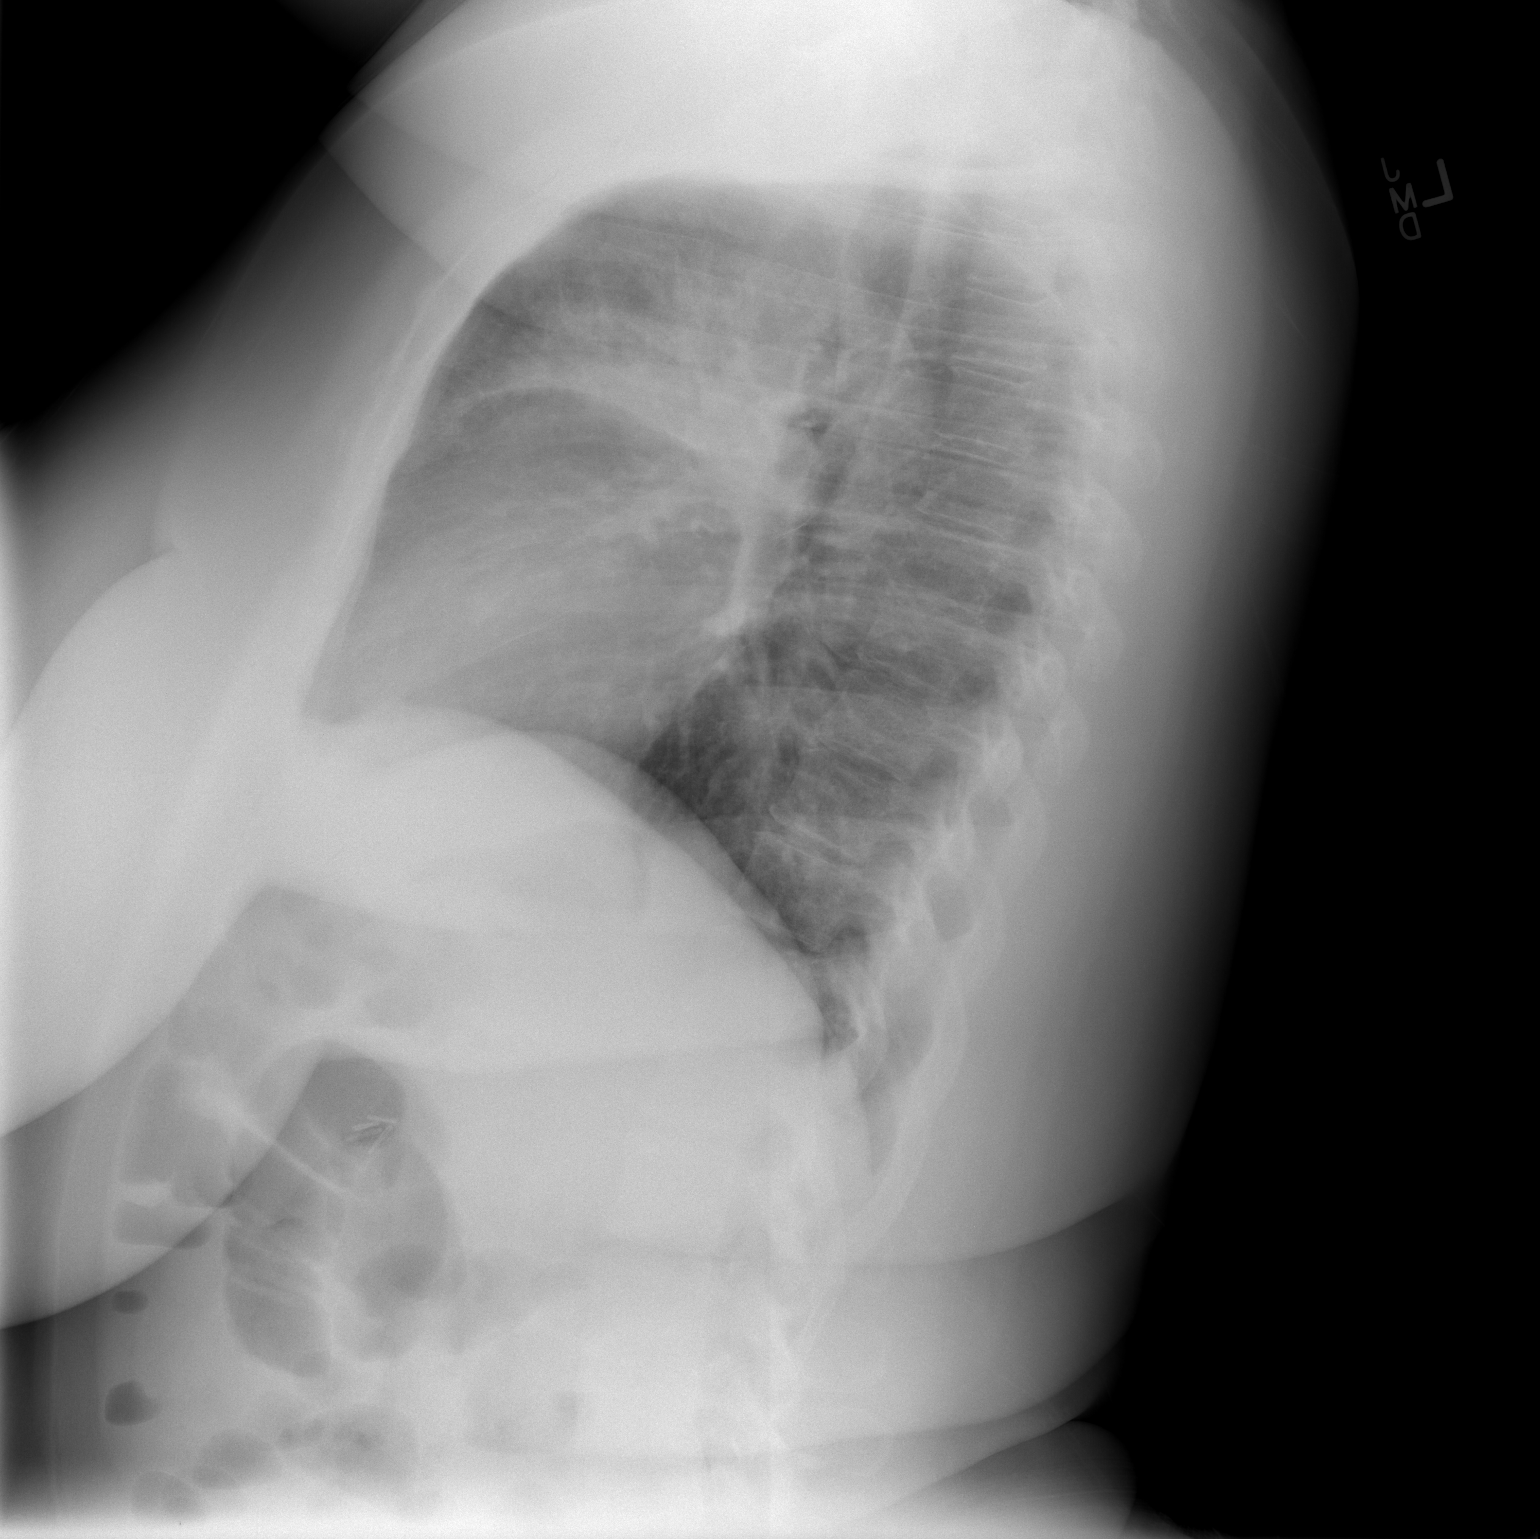

[2 of 2 positions shown; findings below may reference images not displayed]

FINDINGS: Heart size appears normal.

No pleural effusion or pulmonary edema.

Right upper lobe airspace consolidation is again noted and appears
unchanged from prior exam.

No new findings identified.
IMPRESSION: 1. Persistent right upper lobe pneumonia.  Recommend continued
radiographic followup to ensure resolution.

## 2012-05-19 ENCOUNTER — Other Ambulatory Visit: Payer: Self-pay | Admitting: Obstetrics

## 2012-05-19 DIAGNOSIS — N6320 Unspecified lump in the left breast, unspecified quadrant: Secondary | ICD-10-CM

## 2012-05-21 ENCOUNTER — Ambulatory Visit
Admission: RE | Admit: 2012-05-21 | Discharge: 2012-05-21 | Disposition: A | Discharge status: Home / Self Care | Payer: Medicaid Other | Source: Ambulatory Visit | Attending: Obstetrics | Admitting: Obstetrics

## 2012-05-21 DIAGNOSIS — N6320 Unspecified lump in the left breast, unspecified quadrant: Secondary | ICD-10-CM

## 2012-06-02 ENCOUNTER — Encounter (HOSPITAL_COMMUNITY): Payer: Self-pay

## 2012-06-02 ENCOUNTER — Emergency Department (HOSPITAL_COMMUNITY): Payer: Medicaid Other

## 2012-06-02 ENCOUNTER — Emergency Department (HOSPITAL_COMMUNITY)
Admission: EM | Admit: 2012-06-02 | Discharge: 2012-06-02 | Disposition: A | Discharge status: Home / Self Care | Payer: Medicaid Other | Attending: Emergency Medicine | Admitting: Emergency Medicine

## 2012-06-02 DIAGNOSIS — F17200 Nicotine dependence, unspecified, uncomplicated: Secondary | ICD-10-CM | POA: Insufficient documentation

## 2012-06-02 DIAGNOSIS — J069 Acute upper respiratory infection, unspecified: Secondary | ICD-10-CM | POA: Insufficient documentation

## 2012-06-02 HISTORY — DX: Unspecified lump in unspecified breast: N63.0

## 2012-06-02 MED ORDER — BENZONATATE 100 MG PO CAPS: 100.0000 mg | ORAL_CAPSULE | Freq: Three times a day (TID) | ORAL | Status: DC

## 2012-06-02 NOTE — ED Notes (Signed)
MD at bedside. 

## 2012-06-02 NOTE — ED Notes (Signed)
Pt presents with no acute distress.  Productive cough x 3 weeks.  Clear to yellow productive cough

## 2012-06-02 NOTE — ED Provider Notes (Signed)
History     CSN: [id number]  Arrival date & time 06/02/12  1005   First MD Initiated Contact with Patient 06/02/12 1220      Chief Complaint  Patient presents with  . Cough  . Nasal Congestion  . Urinary Frequency    (Consider location/radiation/quality/duration/timing/severity/associated sxs/prior treatment) HPI  31 y.o. female in no acute distress complaining of productive cough and stress incontinence when she coughs worsening over the course of 3 weeks. Denies fever, shortness of breath, nausea vomiting, chest pain. Patient has remote history of pneumonia over a year ago. Past Medical History  Diagnosis Date  . Breast nodule     Past Surgical History  Procedure Date  . Cholecystectomy   . Tubal ligation   . Leg sugery left leg    No family history on file.  History  Substance Use Topics  . Smoking status: Current Every Day Smoker  . Smokeless tobacco: Not on file  . Alcohol Use: Yes    OB History    Grav Para Term Preterm Abortions TAB SAB Ect Mult Living                  Review of Systems  Constitutional: Negative for fever.  Respiratory: Positive for cough. Negative for shortness of breath.   Cardiovascular: Negative for chest pain.  Gastrointestinal: Negative for nausea, vomiting, abdominal pain and diarrhea.  Genitourinary: Positive for urgency.  All other systems reviewed and are negative.    Allergies  Review of patient's allergies indicates no known allergies.  Home Medications   Current Outpatient Rx  Name Route Sig Dispense Refill  . NYQUIL PO Oral Take 15 mLs by mouth 2 (two) times daily as needed. For cold symptoms.      BP 112/67  Pulse 63  Temp 97.4 F (36.3 C) (Oral)  Resp 16  SpO2 100%  LMP 05/23/2012  Physical Exam  Nursing note and vitals reviewed. Constitutional: She is oriented to person, place, and time. She appears well-developed and well-nourished. No distress.  HENT:  Head: Normocephalic.  Mouth/Throat:  Oropharynx is clear and moist.  Eyes: Conjunctivae normal and EOM are normal. Pupils are equal, round, and reactive to light.  Neck: Normal range of motion.  Cardiovascular: Normal rate, regular rhythm and intact distal pulses.   Pulmonary/Chest: Effort normal and breath sounds normal. No stridor. No respiratory distress. She has no wheezes. She has no rales. She exhibits no tenderness.  Abdominal: Soft. Bowel sounds are normal.  Musculoskeletal: Normal range of motion. She exhibits no edema.  Neurological: She is alert and oriented to person, place, and time.  Psychiatric: She has a normal mood and affect.    ED Course  Procedures (including critical care time)  Labs Reviewed - No data to display Dg Chest 2 View  06/02/2012  *RADIOLOGY REPORT*  Clinical Data: Cough, nasal congestion, smoker.  CHEST - 2 VIEW  Comparison: 10/20/2010  Findings: Heart and mediastinal contours are within normal limits. No focal opacities or effusions.  No acute bony abnormality.  IMPRESSION: No active cardiopulmonary disease.   Original Report Authenticated By: Cyndie Chime, M.D.      1. URI (upper respiratory infection)       MDM  Patient with productive cough and stress incontinence for 3 weeks. No other associated symptoms. Lung sounds and Chest x-ray are clear.   Likely URI with postnasal drip I will advise hydration and provide symptomatic control.  New Prescriptions   BENZONATATE (TESSALON) 100 MG CAPSULE  Take 1 capsule (100 mg total) by mouth every 8 (eight) hours.           Wynetta Emery, PA-C 06/02/12 1445

## 2012-06-02 NOTE — ED Notes (Signed)
Pt c/o productive cough, frequent urination, congestion x 3 weeks, was also diagnosed with pneumonia by xray, finished her antibiotic but symptoms persist, want re-evaluation

## 2012-06-02 NOTE — ED Provider Notes (Signed)
Medical screening examination/treatment/procedure(s) were performed by non-physician practitioner and as supervising physician I was immediately available for consultation/collaboration.    Celene Kras, MD 06/02/12 [phone]

## 2012-06-02 NOTE — ED Notes (Signed)
Pt c/o of urinary incontinence with cough

## 2012-08-12 ENCOUNTER — Encounter (HOSPITAL_COMMUNITY): Payer: Self-pay | Admitting: *Deleted

## 2012-08-12 ENCOUNTER — Other Ambulatory Visit: Payer: Self-pay | Admitting: Advanced Practice Midwife

## 2012-08-12 ENCOUNTER — Inpatient Hospital Stay (HOSPITAL_COMMUNITY)
Admission: AD | Admit: 2012-08-12 | Discharge: 2012-08-12 | Disposition: A | Discharge status: Home / Self Care | Payer: Medicaid Other | Source: Ambulatory Visit | Attending: Obstetrics | Admitting: Obstetrics

## 2012-08-12 DIAGNOSIS — K5289 Other specified noninfective gastroenteritis and colitis: Secondary | ICD-10-CM

## 2012-08-12 DIAGNOSIS — R112 Nausea with vomiting, unspecified: Secondary | ICD-10-CM | POA: Insufficient documentation

## 2012-08-12 DIAGNOSIS — N390 Urinary tract infection, site not specified: Secondary | ICD-10-CM

## 2012-08-12 DIAGNOSIS — K529 Noninfective gastroenteritis and colitis, unspecified: Secondary | ICD-10-CM

## 2012-08-12 DIAGNOSIS — A5901 Trichomonal vulvovaginitis: Secondary | ICD-10-CM | POA: Insufficient documentation

## 2012-08-12 HISTORY — DX: Other specified health status: Z78.9

## 2012-08-12 LAB — CBC
MCH: 28 pg (ref 26.0–34.0)
MCHC: 33 g/dL (ref 30.0–36.0)
MCV: 84.7 fL (ref 78.0–100.0)
Platelets: 254 10*3/uL (ref 150–400)
RBC: 4.72 MIL/uL (ref 3.87–5.11)
RDW: 14.1 % (ref 11.5–15.5)

## 2012-08-12 LAB — URINE MICROSCOPIC-ADD ON

## 2012-08-12 LAB — COMPREHENSIVE METABOLIC PANEL
ALT: 13 U/L (ref 0–35)
AST: 15 U/L (ref 0–37)
Albumin: 3.7 g/dL (ref 3.5–5.2)
CO2: 24 mEq/L (ref 19–32)
Calcium: 9.8 mg/dL (ref 8.4–10.5)
Creatinine, Ser: 0.72 mg/dL (ref 0.50–1.10)
GFR calc non Af Amer: >90 mL/min (ref >90)
Sodium: 139 mEq/L (ref 135–145)
Total Protein: 8 g/dL (ref 6.0–8.3)

## 2012-08-12 LAB — URINALYSIS, ROUTINE W REFLEX MICROSCOPIC
Bilirubin Urine: NEGATIVE
Protein, ur: NEGATIVE mg/dL
Urobilinogen, UA: 2 mg/dL — ABNORMAL HIGH (ref 0.0–1.0)

## 2012-08-12 LAB — AMYLASE: Amylase: 68 U/L (ref 0–105)

## 2012-08-12 MED ORDER — METRONIDAZOLE 500 MG PO TABS: 500.0000 mg | ORAL_TABLET | Freq: Once | ORAL | Status: DC

## 2012-08-12 MED ORDER — OMEPRAZOLE MAGNESIUM 20 MG PO TBEC: 20.0000 mg | DELAYED_RELEASE_TABLET | Freq: Every day | ORAL | Status: DC

## 2012-08-12 MED ORDER — PROMETHAZINE HCL 25 MG PO TABS: 25.0000 mg | ORAL_TABLET | Freq: Four times a day (QID) | ORAL | Status: DC | PRN

## 2012-08-12 MED ORDER — HYDROMORPHONE HCL PF 1 MG/ML IJ SOLN: 1.0000 mg | Freq: Once | INTRAMUSCULAR | Status: DC

## 2012-08-12 MED ORDER — PROMETHAZINE HCL 25 MG/ML IJ SOLN
25.0000 mg | Freq: Once | INTRAVENOUS | Status: AC
  Administered 2012-08-12: 25 mg via INTRAVENOUS
  Filled 2012-08-12: qty 1

## 2012-08-12 MED ORDER — CIPROFLOXACIN HCL 500 MG PO TABS: 500.0000 mg | ORAL_TABLET | Freq: Two times a day (BID) | ORAL | Status: AC

## 2012-08-12 MED ORDER — ONDANSETRON 8 MG PO TBDP
8.0000 mg | ORAL_TABLET | Freq: Once | ORAL | Status: AC
  Administered 2012-08-12: 8 mg via ORAL
  Filled 2012-08-12: qty 1

## 2012-08-12 MED ORDER — HYDROMORPHONE HCL PF 1 MG/ML IJ SOLN
1.0000 mg | Freq: Once | INTRAMUSCULAR | Status: AC
  Administered 2012-08-12: 1 mg via INTRAMUSCULAR
  Filled 2012-08-12: qty 1

## 2012-08-12 MED ORDER — PANTOPRAZOLE SODIUM 40 MG IV SOLR
40.0000 mg | Freq: Once | INTRAVENOUS | Status: AC
  Administered 2012-08-12: 40 mg via INTRAVENOUS
  Filled 2012-08-12: qty 40

## 2012-08-12 NOTE — MAU Note (Signed)
Cramping all day yesterday, slept all day, couldn't sleep last night.  Nausea, started vomiting this morning, started as white foam, then green and now just yellow bile.  Burning sensation upper abd, mid abd and around to back.  Had choley 68yrs ago.

## 2012-08-12 NOTE — Progress Notes (Signed)
Seen in MAU for N/V, GERD. UA showed UTI. Forgot Rx. Will Rx Cipro.

## 2012-08-12 NOTE — MAU Provider Note (Signed)
Chief Complaint: Nausea and Emesis  First Provider Initiated Contact with Patient 08/12/12 1029     SUBJECTIVE HPI: Linda Odom is a 31 y.o. [id number], LMP 08/01/12 who presents with N/V since last night after dinner (steak). Worried that she is pregnant in her tubes. Hx BTL and cholecystectomy. Has not taken UPT and anything for N/V. Unable to eat or drink since dinner. Denies fever, sick contacts, low abd pain, urinary complaints, vaginal dischange. Possible chills this morning. Diffuse upper abd, low and and mid back pain since vomiting started, RUQ > than rest or pain. No Hx of similar Sx.    Past Medical History  Diagnosis Date  . Breast nodule    OB History    Grav Para Term Preterm Abortions TAB SAB Ect Mult Living                 Past Surgical History  Procedure Date  . Cholecystectomy   . Tubal ligation   . Leg sugery left leg   History   Social History  . Marital Status: Single    Spouse Name: N/A    Number of Children: N/A  . Years of Education: N/A   Occupational History  . Not on file.   Social History Main Topics  . Smoking status: Current Every Day Smoker  . Smokeless tobacco: Not on file  . Alcohol Use: Yes  . Drug Use: No  . Sexually Active:    Other Topics Concern  . Not on file   Social History Narrative  . No narrative on file   No current facility-administered medications on file prior to encounter.   Current Outpatient Prescriptions on File Prior to Encounter  Medication Sig Dispense Refill  . benzonatate (TESSALON) 100 MG capsule Take 1 capsule (100 mg total) by mouth every 8 (eight) hours.  21 capsule  0  . Pseudoeph-Doxylamine-DM-APAP (NYQUIL PO) Take 15 mLs by mouth 2 (two) times daily as needed. For cold symptoms.       No Known Allergies  ROS: Pertinent items in HPI  OBJECTIVE Patient Vitals for the past 24 hrs:  BP Temp Temp src Pulse Resp Height Weight  08/12/12 1556 121/83 mmHg - - 90  18  - -  08/12/12 1458 126/76 mmHg -  - 75  18  - -  08/12/12 1331 131/72 mmHg - - 65  18  - -  08/12/12 1022 131/72 mmHg 96.8 F (36 C) Axillary 78  20  - -  08/12/12 1000 - - - - - 5\' 2"  (1.575 m) 107.775 kg (237 lb 9.6 oz)   GENERAL: Well-developed, well-nourished female in moderate distress, diaphoretic, wretching. HEENT: Normocephalic HEART: normal rate RESP: normal effort ABDOMEN: Soft, moderate upper abd pain, R>L. No CVAT. Entire bilat, low to mid back mildly tender.  EXTREMITIES: Nontender, no edema NEURO: Alert and oriented SPECULUM EXAM: deferred  LAB RESULTS Results for orders placed during the hospital encounter of 08/12/12 (from the past 24 hour(s))  URINALYSIS, ROUTINE W REFLEX MICROSCOPIC     Status: Abnormal   Collection Time   08/12/12 10:00 AM      Component Value Range   Color, Urine YELLOW  YELLOW   APPearance CLOUDY (*) CLEAR   Specific Gravity, Urine >1.030 (*) 1.005 - 1.030   pH 6.0  5.0 - 8.0   Glucose, UA NEGATIVE  NEGATIVE mg/dL   Hgb urine dipstick LARGE (*) NEGATIVE   Bilirubin Urine NEGATIVE  NEGATIVE   Ketones, ur NEGATIVE  NEGATIVE mg/dL   Protein, ur NEGATIVE  NEGATIVE mg/dL   Urobilinogen, UA 2.0 (*) 0.0 - 1.0 mg/dL   Nitrite POSITIVE (*) NEGATIVE   Leukocytes, UA NEGATIVE  NEGATIVE  URINE MICROSCOPIC-ADD ON     Status: Normal   Collection Time   08/12/12 10:00 AM      Component Value Range   WBC, UA 0-2  <3 WBC/hpf   RBC / HPF 21-50  <3 RBC/hpf   Bacteria, UA RARE  RARE   Urine-Other TRICHOMONAS NOTED    POCT PREGNANCY, URINE     Status: Normal   Collection Time   08/12/12 10:04 AM      Component Value Range   Preg Test, Ur NEGATIVE  NEGATIVE  CBC     Status: Abnormal   Collection Time   08/12/12 11:00 AM      Component Value Range   WBC 11.3 (*) 4.0 - 10.5 K/uL   RBC 4.72  3.87 - 5.11 MIL/uL   Hemoglobin 13.2  12.0 - 15.0 g/dL   HCT 16.1  09.6 - 04.5 %   MCV 84.7  78.0 - 100.0 fL   MCH 28.0  26.0 - 34.0 pg   MCHC 33.0  30.0 - 36.0 g/dL   RDW 40.9  81.1 - 91.4 %    Platelets 254  150 - 400 K/uL  COMPREHENSIVE METABOLIC PANEL     Status: Normal   Collection Time   08/12/12 11:00 AM      Component Value Range   Sodium 139  135 - 145 mEq/L   Potassium 4.0  3.5 - 5.1 mEq/L   Chloride 104  96 - 112 mEq/L   CO2 24  19 - 32 mEq/L   Glucose, Bld 95  70 - 99 mg/dL   BUN 10  6 - 23 mg/dL   Creatinine, Ser 7.82  0.50 - 1.10 mg/dL   Calcium 9.8  8.4 - 95.6 mg/dL   Total Protein 8.0  6.0 - 8.3 g/dL   Albumin 3.7  3.5 - 5.2 g/dL   AST 15  0 - 37 U/L   ALT 13  0 - 35 U/L   Alkaline Phosphatase 82  39 - 117 U/L   Total Bilirubin 0.4  0.3 - 1.2 mg/dL   GFR calc non Af Amer >90  >90 mL/min   GFR calc Af Amer >90  >90 mL/min  LIPASE, BLOOD     Status: Normal   Collection Time   08/12/12 11:00 AM      Component Value Range   Lipase 19  11 - 59 U/L  AMYLASE     Status: Normal   Collection Time   08/12/12 11:00 AM      Component Value Range   Amylase 68  0 - 105 U/L    IMAGING No results found.  MAU COURSE 1115: No relief of N/V w/ Zofran ODT. Reports increased epigastric burning, worsening pain. Will order IV phenergan, protonix, IV fluids, CBC, CMET, Amylase, Lipase. Actively vomiting bile.  1130: Dr. Gaynell Face notified of pt severe pain, no emergent OB/Gyn issues and orders for IV phenergan, Protonix, IV fluids, CBC (normal), CMET, Amylase, Lipase. Agrees that pt should be transferred to ED. Pt called out to RN station repeatedly due to burning pain. Nurse attempting IV start.  1139: Pt hesitant to transfer due to to pain. Explained that she will get pain meds ASAP and prior to transfer, but that [facility] does not have the available testing or  providers with the appropriate expertise that she may need. Pt does not consent. Changed Dilaudid order from IV to IM while RN continues attempts to start IV.  1215: Pain, burning and vomiting resolved. Nausea persists. Pt calm. Will continue to observe in MAU while phenergan finishes infusing and reconsider transfer PRN.   1530: Pt awake, calm, ready for D/C.    ASSESSMENT 1. Gastroenteritis, acute   2. Trichomonal vaginitis   3. UTI (lower urinary tract infection)    PLAN Discharge home Recommend complete STD testing at Dr. Elsie Stain office. Partner need Tx forTrichomonas.  Follow-up Information    Follow up with Primary Care Provider. (As needed if symptoms worsen)       Follow up with [facility]. (As needed if symptoms worsen)    Contact information:   [address] [place] [address]           Medication List     As of 08/12/2012  4:56 PM    TAKE these medications         metroNIDAZOLE 500 MG tablet   Commonly known as: FLAGYL   Take 1 tablet (500 mg total) by mouth once.      omeprazole 20 MG tablet   Commonly known as: PRILOSEC OTC   Take 1 tablet (20 mg total) by mouth daily.      promethazine 25 MG tablet   Commonly known as: PHENERGAN   Take 1 tablet (25 mg total) by mouth every 6 (six) hours as needed for nausea.        [place], [place] 08/12/2012  4:13PM

## 2012-08-12 NOTE — MAU Note (Addendum)
Patient presents with c/o N/V since yesterday thinks she is pregnant in her tubes. Patient has had prior tubal ligation.

## 2012-08-12 NOTE — MAU Note (Signed)
Pt continues to retch, mainly bile and mucous..  Stated had thrown up as soon as zofran disolved.

## 2012-10-20 ENCOUNTER — Other Ambulatory Visit: Payer: Self-pay | Admitting: Obstetrics

## 2012-10-20 DIAGNOSIS — N6320 Unspecified lump in the left breast, unspecified quadrant: Secondary | ICD-10-CM

## 2012-11-09 ENCOUNTER — Other Ambulatory Visit: Payer: Self-pay

## 2013-09-08 ENCOUNTER — Emergency Department (HOSPITAL_COMMUNITY)
Admission: EM | Admit: 2013-09-08 | Discharge: 2013-09-08 | Disposition: A | Discharge status: Home / Self Care | Payer: Medicaid Other | Source: Home / Self Care | Attending: Family Medicine | Admitting: Family Medicine

## 2013-09-08 ENCOUNTER — Emergency Department (INDEPENDENT_AMBULATORY_CARE_PROVIDER_SITE_OTHER): Payer: Self-pay

## 2013-09-08 ENCOUNTER — Encounter (HOSPITAL_COMMUNITY): Payer: Self-pay | Admitting: Emergency Medicine

## 2013-09-08 DIAGNOSIS — J069 Acute upper respiratory infection, unspecified: Secondary | ICD-10-CM

## 2013-09-08 MED ORDER — HYDROCODONE-ACETAMINOPHEN 5-325 MG PO TABS: 0.5000 | ORAL_TABLET | Freq: Every evening | ORAL | Status: DC | PRN

## 2013-09-08 MED ORDER — IPRATROPIUM BROMIDE 0.06 % NA SOLN: 2.0000 | Freq: Four times a day (QID) | NASAL | Status: DC

## 2013-09-08 MED ORDER — AZITHROMYCIN 250 MG PO TABS: 250.0000 mg | ORAL_TABLET | Freq: Every day | ORAL | Status: DC

## 2013-09-08 NOTE — ED Provider Notes (Signed)
Linda Odom is a 32 y.o. female who presents to Urgent Care today for cough sore throat pleuritic chest pain and runny nose. The symptoms have been present for about one week. Patient describes her chest pains as sharp, diffuse, and worse with deep inspiration and cough. She denies any central exertional crushing chest pain. She notes the cough is somewhat bothersome especially at night.   Past Medical History  Diagnosis Date  . Breast nodule   . No pertinent past medical history    History  Substance Use Topics  . Smoking status: Current Some Day Smoker -- 17.00 packs/day  . Smokeless tobacco: Never Used  . Alcohol Use: Yes     Comment: occ   ROS as above Medications reviewed. No current facility-administered medications for this encounter.   Current Outpatient Prescriptions  Medication Sig Dispense Refill  . azithromycin (ZITHROMAX) 250 MG tablet Take 1 tablet (250 mg total) by mouth daily. Take first 2 tablets together, then 1 every day until finished.  6 tablet  0  . HYDROcodone-acetaminophen (NORCO/VICODIN) 5-325 MG per tablet Take 0.5 tablets by mouth at bedtime as needed (cough).  6 tablet  0  . ipratropium (ATROVENT) 0.06 % nasal spray Place 2 sprays into both nostrils 4 (four) times daily.  15 mL  1  . omeprazole (PRILOSEC OTC) 20 MG tablet Take 1 tablet (20 mg total) by mouth daily.  28 tablet  1  . [DISCONTINUED] promethazine (PHENERGAN) 25 MG tablet Take 1 tablet (25 mg total) by mouth every 6 (six) hours as needed for nausea.  30 tablet  0    Exam:  BP 133/106  Pulse 91  Temp(Src) 98.2 F (36.8 C) (Oral)  Resp 18  SpO2 100%  LMP 07/25/2013 Gen: Well NAD HEENT: EOMI,  MMM Lungs: Normal work of breathing. Mild Rhonchorous breath sounds bilaterally Chest wall is mildly tender to palpation Heart: RRR no MRG Abd: NABS, Soft. NT, ND Exts: Non edematous BL  LE, warm and well perfused.   Results for orders placed during the hospital encounter of 09/08/13 (from the  past 24 hour(s))  POCT RAPID STREP A ([facility] ONLY)     Status: None   Collection Time    09/08/13 11:08 AM      Result Value Range   Streptococcus, Group A Screen (Direct) NEGATIVE  NEGATIVE   Dg Chest 2 View  09/08/2013   CLINICAL DATA:  Cough and congestion.  EXAM: CHEST  2 VIEW  COMPARISON:  06/02/2012.  FINDINGS: The heart size and mediastinal contours are within normal limits. Both lungs are clear. The visualized skeletal structures are unremarkable.  IMPRESSION: No active cardiopulmonary disease.   Electronically Signed   By: Maisie Fus  Register   On: 09/08/2013 11:44    Assessment and Plan: 32 y.o. female with URI versus bronchitis. Plan for treatment with hydrocodone his cough medication, Atrovent nasal spray, Tylenol and ibuprofen. If not improving recommend starting azithromycin antibiotics. Discussed warning signs or symptoms. Please see discharge instructions. Patient expresses understanding.      Rodolph Bong, MD 09/08/13 [phone]

## 2013-09-08 NOTE — ED Notes (Signed)
C/o cold States she has coughing with chest pain, headache, diarrhea, and runny nose Denies vomiting and nausea.  otc medications taking but no relief.

## 2013-09-10 LAB — CULTURE, GROUP A STREP

## 2013-09-30 ENCOUNTER — Inpatient Hospital Stay (HOSPITAL_COMMUNITY)
Admission: AD | Admit: 2013-09-30 | Discharge: 2013-09-30 | Discharge status: Against Medical Advice | Payer: Medicaid Other | Source: Ambulatory Visit | Attending: Obstetrics | Admitting: Obstetrics

## 2013-09-30 DIAGNOSIS — R110 Nausea: Secondary | ICD-10-CM | POA: Insufficient documentation

## 2013-09-30 DIAGNOSIS — R197 Diarrhea, unspecified: Secondary | ICD-10-CM | POA: Insufficient documentation

## 2013-09-30 DIAGNOSIS — R109 Unspecified abdominal pain: Secondary | ICD-10-CM | POA: Insufficient documentation

## 2013-09-30 LAB — URINALYSIS, ROUTINE W REFLEX MICROSCOPIC
BILIRUBIN URINE: NEGATIVE
Glucose, UA: NEGATIVE mg/dL
Hgb urine dipstick: NEGATIVE
Ketones, ur: NEGATIVE mg/dL
LEUKOCYTES UA: NEGATIVE
NITRITE: POSITIVE — AB
Protein, ur: NEGATIVE mg/dL
UROBILINOGEN UA: 0.2 mg/dL (ref 0.0–1.0)
pH: 6 (ref 5.0–8.0)

## 2013-09-30 LAB — URINE MICROSCOPIC-ADD ON

## 2013-09-30 NOTE — MAU Note (Signed)
Patient states she has had upper abdominal pain for 3 days. States she has nausea, diarrhea. Denies bleeding and has a normal vaginal discharge. Denies sore throat, cough or fever.

## 2013-10-01 LAB — URINE CULTURE

## 2013-11-13 ENCOUNTER — Encounter (HOSPITAL_COMMUNITY): Payer: Self-pay | Admitting: Emergency Medicine

## 2013-11-13 ENCOUNTER — Emergency Department (HOSPITAL_COMMUNITY): Payer: Medicaid Other

## 2013-11-13 ENCOUNTER — Encounter (HOSPITAL_COMMUNITY): Payer: Medicaid Other | Admitting: Certified Registered"

## 2013-11-13 ENCOUNTER — Encounter (HOSPITAL_COMMUNITY): Admission: EM | Disposition: A | Discharge status: Home / Self Care | Payer: Self-pay | Source: Home / Self Care

## 2013-11-13 ENCOUNTER — Inpatient Hospital Stay (HOSPITAL_COMMUNITY)
Admission: EM | Admit: 2013-11-13 | Discharge: 2013-11-19 | DRG: 330 | Disposition: A | Discharge status: Home / Self Care | Payer: Medicaid Other | Attending: Surgery | Admitting: Surgery

## 2013-11-13 ENCOUNTER — Observation Stay (HOSPITAL_COMMUNITY): Payer: Medicaid Other | Admitting: Certified Registered"

## 2013-11-13 DIAGNOSIS — K560 Paralytic ileus: Secondary | ICD-10-CM | POA: Diagnosis not present

## 2013-11-13 DIAGNOSIS — K3580 Unspecified acute appendicitis: Principal | ICD-10-CM | POA: Diagnosis present

## 2013-11-13 DIAGNOSIS — K36 Other appendicitis: Secondary | ICD-10-CM

## 2013-11-13 DIAGNOSIS — F17200 Nicotine dependence, unspecified, uncomplicated: Secondary | ICD-10-CM | POA: Diagnosis present

## 2013-11-13 DIAGNOSIS — R599 Enlarged lymph nodes, unspecified: Secondary | ICD-10-CM | POA: Diagnosis present

## 2013-11-13 DIAGNOSIS — Z9089 Acquired absence of other organs: Secondary | ICD-10-CM

## 2013-11-13 DIAGNOSIS — Z6841 Body Mass Index (BMI) 40.0 and over, adult: Secondary | ICD-10-CM

## 2013-11-13 DIAGNOSIS — Z8249 Family history of ischemic heart disease and other diseases of the circulatory system: Secondary | ICD-10-CM

## 2013-11-13 DIAGNOSIS — K929 Disease of digestive system, unspecified: Secondary | ICD-10-CM | POA: Diagnosis not present

## 2013-11-13 DIAGNOSIS — K219 Gastro-esophageal reflux disease without esophagitis: Secondary | ICD-10-CM | POA: Diagnosis present

## 2013-11-13 DIAGNOSIS — Z5331 Laparoscopic surgical procedure converted to open procedure: Secondary | ICD-10-CM

## 2013-11-13 DIAGNOSIS — N390 Urinary tract infection, site not specified: Secondary | ICD-10-CM | POA: Diagnosis present

## 2013-11-13 HISTORY — PX: LAPAROSCOPIC APPENDECTOMY: SHX408

## 2013-11-13 HISTORY — DX: Gastric ulcer, unspecified as acute or chronic, without hemorrhage or perforation: K25.9

## 2013-11-13 HISTORY — DX: Unspecified acute appendicitis: K35.80

## 2013-11-13 LAB — COMPREHENSIVE METABOLIC PANEL
ALBUMIN: 3.1 g/dL — AB (ref 3.5–5.2)
ALT: 28 U/L (ref 0–35)
AST: 21 U/L (ref 0–37)
Alkaline Phosphatase: 71 U/L (ref 39–117)
BUN: 7 mg/dL (ref 6–23)
CO2: 24 mEq/L (ref 19–32)
CREATININE: 0.7 mg/dL (ref 0.50–1.10)
Calcium: 9.1 mg/dL (ref 8.4–10.5)
Chloride: 104 mEq/L (ref 96–112)
GFR calc Af Amer: >90 mL/min (ref >90)
GFR calc non Af Amer: >90 mL/min (ref >90)
Glucose, Bld: 92 mg/dL (ref 70–99)
POTASSIUM: 4.2 meq/L (ref 3.7–5.3)
Sodium: 139 mEq/L (ref 137–147)
TOTAL PROTEIN: 7.3 g/dL (ref 6.0–8.3)
Total Bilirubin: 0.3 mg/dL (ref 0.3–1.2)

## 2013-11-13 LAB — URINALYSIS, ROUTINE W REFLEX MICROSCOPIC
Bilirubin Urine: NEGATIVE
GLUCOSE, UA: NEGATIVE mg/dL
Ketones, ur: NEGATIVE mg/dL
Leukocytes, UA: NEGATIVE
Nitrite: POSITIVE — AB
Protein, ur: NEGATIVE mg/dL
SPECIFIC GRAVITY, URINE: 1.027 (ref 1.005–1.030)
UROBILINOGEN UA: 0.2 mg/dL (ref 0.0–1.0)
pH: 5.5 (ref 5.0–8.0)

## 2013-11-13 LAB — CBC WITH DIFFERENTIAL/PLATELET
Basophils Absolute: 0 10*3/uL (ref 0.0–0.1)
Basophils Relative: 0 % (ref 0–1)
EOS PCT: 1 % (ref 0–5)
Eosinophils Absolute: 0.1 10*3/uL (ref 0.0–0.7)
HEMATOCRIT: 36.4 % (ref 36.0–46.0)
Hemoglobin: 12 g/dL (ref 12.0–15.0)
LYMPHS ABS: 2.8 10*3/uL (ref 0.7–4.0)
LYMPHS PCT: 42 % (ref 12–46)
MCH: 28.2 pg (ref 26.0–34.0)
MCHC: 33 g/dL (ref 30.0–36.0)
MCV: 85.4 fL (ref 78.0–100.0)
MONO ABS: 0.4 10*3/uL (ref 0.1–1.0)
MONOS PCT: 6 % (ref 3–12)
Neutro Abs: 3.4 10*3/uL (ref 1.7–7.7)
Neutrophils Relative %: 51 % (ref 43–77)
Platelets: 221 10*3/uL (ref 150–400)
RBC: 4.26 MIL/uL (ref 3.87–5.11)
RDW: 14.2 % (ref 11.5–15.5)
WBC: 6.7 10*3/uL (ref 4.0–10.5)

## 2013-11-13 LAB — POC URINE PREG, ED: Preg Test, Ur: NEGATIVE

## 2013-11-13 LAB — URINE MICROSCOPIC-ADD ON

## 2013-11-13 LAB — LIPASE, BLOOD: Lipase: 20 U/L (ref 11–59)

## 2013-11-13 SURGERY — APPENDECTOMY, LAPAROSCOPIC
Anesthesia: General | Site: Abdomen

## 2013-11-13 MED ORDER — NEOSTIGMINE METHYLSULFATE 1 MG/ML IJ SOLN
INTRAMUSCULAR | Status: AC
  Filled 2013-11-13: qty 10

## 2013-11-13 MED ORDER — ONDANSETRON HCL 4 MG/2ML IJ SOLN
INTRAMUSCULAR | Status: AC
  Filled 2013-11-13: qty 2

## 2013-11-13 MED ORDER — MORPHINE SULFATE 4 MG/ML IJ SOLN: 4.0000 mg | Freq: Once | INTRAMUSCULAR | Status: DC

## 2013-11-13 MED ORDER — MORPHINE SULFATE (PF) 1 MG/ML IV SOLN
INTRAVENOUS | Status: DC
  Administered 2013-11-13: 19:00:00 via INTRAVENOUS
  Administered 2013-11-13: 12 mg via INTRAVENOUS
  Administered 2013-11-13: 16.48 mg via INTRAVENOUS
  Administered 2013-11-13: 1.5 mg via INTRAVENOUS
  Administered 2013-11-13: 12:00:00 via INTRAVENOUS
  Administered 2013-11-13: 10.5 mg via INTRAVENOUS
  Administered 2013-11-14: 04:00:00 via INTRAVENOUS
  Administered 2013-11-14: 13.5 mg via INTRAVENOUS
  Administered 2013-11-14: 28.5 mg via INTRAVENOUS
  Filled 2013-11-13 (×3): qty 25

## 2013-11-13 MED ORDER — MEPERIDINE HCL 50 MG/ML IJ SOLN: 6.2500 mg | INTRAMUSCULAR | Status: DC | PRN

## 2013-11-13 MED ORDER — SODIUM CHLORIDE 0.9 % IJ SOLN: 9.0000 mL | INTRAMUSCULAR | Status: DC | PRN

## 2013-11-13 MED ORDER — KETOROLAC TROMETHAMINE 30 MG/ML IJ SOLN
15.0000 mg | Freq: Once | INTRAMUSCULAR | Status: AC | PRN
  Administered 2013-11-13: 30 mg via INTRAVENOUS

## 2013-11-13 MED ORDER — IOHEXOL 300 MG/ML  SOLN
50.0000 mL | Freq: Once | INTRAMUSCULAR | Status: AC | PRN
  Administered 2013-11-13: 50 mL via ORAL

## 2013-11-13 MED ORDER — NALOXONE HCL 0.4 MG/ML IJ SOLN: 0.4000 mg | INTRAMUSCULAR | Status: DC | PRN

## 2013-11-13 MED ORDER — ONDANSETRON 4 MG PO TBDP
4.0000 mg | ORAL_TABLET | Freq: Once | ORAL | Status: AC
  Administered 2013-11-13: 4 mg via ORAL
  Filled 2013-11-13: qty 1

## 2013-11-13 MED ORDER — ROCURONIUM BROMIDE 100 MG/10ML IV SOLN
INTRAVENOUS | Status: AC
Start: 2013-11-13 — End: 2013-11-13
  Filled 2013-11-13: qty 1

## 2013-11-13 MED ORDER — SODIUM CHLORIDE 0.9 % IV BOLUS (SEPSIS)
1000.0000 mL | Freq: Once | INTRAVENOUS | Status: AC
  Administered 2013-11-13: 1000 mL via INTRAVENOUS

## 2013-11-13 MED ORDER — FENTANYL CITRATE 0.05 MG/ML IJ SOLN
INTRAMUSCULAR | Status: DC | PRN
  Administered 2013-11-13: 100 ug via INTRAVENOUS
  Administered 2013-11-13 (×3): 50 ug via INTRAVENOUS

## 2013-11-13 MED ORDER — GLYCOPYRROLATE 0.2 MG/ML IJ SOLN
INTRAMUSCULAR | Status: AC
  Filled 2013-11-13: qty 2

## 2013-11-13 MED ORDER — KETOROLAC TROMETHAMINE 30 MG/ML IJ SOLN
INTRAMUSCULAR | Status: AC
  Filled 2013-11-13: qty 1

## 2013-11-13 MED ORDER — HYDROMORPHONE HCL PF 1 MG/ML IJ SOLN
INTRAMUSCULAR | Status: DC | PRN
  Administered 2013-11-13 (×2): 1 mg via INTRAVENOUS

## 2013-11-13 MED ORDER — 0.9 % SODIUM CHLORIDE (POUR BTL) OPTIME
TOPICAL | Status: DC | PRN
  Administered 2013-11-13: 1000 mL

## 2013-11-13 MED ORDER — KCL IN DEXTROSE-NACL 20-5-0.45 MEQ/L-%-% IV SOLN: Freq: Once | INTRAVENOUS | Status: DC

## 2013-11-13 MED ORDER — HEPARIN SODIUM (PORCINE) 5000 UNIT/ML IJ SOLN
5000.0000 [IU] | Freq: Three times a day (TID) | INTRAMUSCULAR | Status: DC
  Administered 2013-11-14 – 2013-11-17 (×11): 5000 [IU] via SUBCUTANEOUS
  Filled 2013-11-13 (×19): qty 1

## 2013-11-13 MED ORDER — HYDROMORPHONE HCL PF 1 MG/ML IJ SOLN
0.2500 mg | INTRAMUSCULAR | Status: DC | PRN
  Administered 2013-11-13 (×2): 0.5 mg via INTRAVENOUS

## 2013-11-13 MED ORDER — ROCURONIUM BROMIDE 100 MG/10ML IV SOLN
INTRAVENOUS | Status: DC | PRN
  Administered 2013-11-13: 10 mg via INTRAVENOUS
  Administered 2013-11-13: 40 mg via INTRAVENOUS
  Administered 2013-11-13: 10 mg via INTRAVENOUS

## 2013-11-13 MED ORDER — SODIUM CHLORIDE 0.9 % IV SOLN
3.0000 g | Freq: Four times a day (QID) | INTRAVENOUS | Status: AC
  Administered 2013-11-13 – 2013-11-14 (×4): 3 g via INTRAVENOUS
  Filled 2013-11-13 (×4): qty 3

## 2013-11-13 MED ORDER — PROMETHAZINE HCL 25 MG/ML IJ SOLN: 6.2500 mg | INTRAMUSCULAR | Status: DC | PRN

## 2013-11-13 MED ORDER — HYDROMORPHONE HCL PF 1 MG/ML IJ SOLN
INTRAMUSCULAR | Status: AC
  Filled 2013-11-13: qty 1

## 2013-11-13 MED ORDER — LABETALOL HCL 5 MG/ML IV SOLN
INTRAVENOUS | Status: AC
  Filled 2013-11-13: qty 4

## 2013-11-13 MED ORDER — DIPHENHYDRAMINE HCL 50 MG/ML IJ SOLN: 12.5000 mg | Freq: Four times a day (QID) | INTRAMUSCULAR | Status: DC | PRN

## 2013-11-13 MED ORDER — HYDROMORPHONE HCL PF 2 MG/ML IJ SOLN
INTRAMUSCULAR | Status: AC
  Filled 2013-11-13: qty 1

## 2013-11-13 MED ORDER — ONDANSETRON HCL 4 MG/2ML IJ SOLN
INTRAMUSCULAR | Status: DC | PRN
  Administered 2013-11-13: 4 mg via INTRAVENOUS

## 2013-11-13 MED ORDER — ONDANSETRON HCL 4 MG/2ML IJ SOLN: 4.0000 mg | Freq: Four times a day (QID) | INTRAMUSCULAR | Status: DC | PRN

## 2013-11-13 MED ORDER — ONDANSETRON HCL 4 MG PO TABS
4.0000 mg | ORAL_TABLET | Freq: Four times a day (QID) | ORAL | Status: DC | PRN
  Administered 2013-11-19: 4 mg via ORAL
  Filled 2013-11-13: qty 1

## 2013-11-13 MED ORDER — PROPOFOL 10 MG/ML IV BOLUS
INTRAVENOUS | Status: DC | PRN
  Administered 2013-11-13: 50 mg via INTRAVENOUS
  Administered 2013-11-13: 150 mg via INTRAVENOUS

## 2013-11-13 MED ORDER — AMPICILLIN-SULBACTAM SODIUM 3 (2-1) G IJ SOLR
3.0000 g | Freq: Once | INTRAMUSCULAR | Status: AC
  Administered 2013-11-13: 3 g via INTRAVENOUS
  Filled 2013-11-13: qty 3

## 2013-11-13 MED ORDER — LACTATED RINGERS IV SOLN: INTRAVENOUS | Status: DC

## 2013-11-13 MED ORDER — MIDAZOLAM HCL 2 MG/2ML IJ SOLN: 0.5000 mg | Freq: Once | INTRAMUSCULAR | Status: DC | PRN

## 2013-11-13 MED ORDER — MORPHINE SULFATE 4 MG/ML IJ SOLN
4.0000 mg | Freq: Once | INTRAMUSCULAR | Status: AC
  Administered 2013-11-13: 4 mg via INTRAVENOUS
  Filled 2013-11-13: qty 1

## 2013-11-13 MED ORDER — BUPIVACAINE HCL (PF) 0.5 % IJ SOLN
INTRAMUSCULAR | Status: AC
  Filled 2013-11-13: qty 30

## 2013-11-13 MED ORDER — GLYCOPYRROLATE 0.2 MG/ML IJ SOLN
INTRAMUSCULAR | Status: DC | PRN
  Administered 2013-11-13: 0.6 mg via INTRAVENOUS

## 2013-11-13 MED ORDER — DIPHENHYDRAMINE HCL 12.5 MG/5ML PO ELIX: 12.5000 mg | ORAL_SOLUTION | Freq: Four times a day (QID) | ORAL | Status: DC | PRN

## 2013-11-13 MED ORDER — LABETALOL HCL 5 MG/ML IV SOLN
INTRAVENOUS | Status: DC | PRN
  Administered 2013-11-13: 5 mg via INTRAVENOUS

## 2013-11-13 MED ORDER — ONDANSETRON HCL 4 MG/2ML IJ SOLN
4.0000 mg | Freq: Four times a day (QID) | INTRAMUSCULAR | Status: DC | PRN
  Administered 2013-11-15 – 2013-11-16 (×2): 4 mg via INTRAVENOUS
  Filled 2013-11-13 (×3): qty 2

## 2013-11-13 MED ORDER — FENTANYL CITRATE 0.05 MG/ML IJ SOLN
INTRAMUSCULAR | Status: AC
  Filled 2013-11-13: qty 2

## 2013-11-13 MED ORDER — MIDAZOLAM HCL 2 MG/2ML IJ SOLN
INTRAMUSCULAR | Status: AC
  Filled 2013-11-13: qty 2

## 2013-11-13 MED ORDER — IOHEXOL 300 MG/ML  SOLN
100.0000 mL | Freq: Once | INTRAMUSCULAR | Status: AC | PRN
  Administered 2013-11-13: 100 mL via INTRAVENOUS

## 2013-11-13 MED ORDER — KCL-LACTATED RINGERS-D5W 20 MEQ/L IV SOLN
INTRAVENOUS | Status: DC
  Administered 2013-11-13: 100 mL/h via INTRAVENOUS
  Administered 2013-11-14: 02:00:00 via INTRAVENOUS
  Administered 2013-11-14: 100 mL/h via INTRAVENOUS
  Administered 2013-11-15 – 2013-11-16 (×3): via INTRAVENOUS
  Administered 2013-11-17: 100 mL via INTRAVENOUS
  Administered 2013-11-17 – 2013-11-18 (×3): via INTRAVENOUS
  Filled 2013-11-13 (×19): qty 1000

## 2013-11-13 MED ORDER — FENTANYL CITRATE 0.05 MG/ML IJ SOLN
INTRAMUSCULAR | Status: AC
  Filled 2013-11-13: qty 5

## 2013-11-13 MED ORDER — LACTATED RINGERS IV SOLN
INTRAVENOUS | Status: DC | PRN
  Administered 2013-11-13 (×2): via INTRAVENOUS

## 2013-11-13 MED ORDER — IPRATROPIUM BROMIDE 0.03 % NA SOLN
2.0000 | Freq: Four times a day (QID) | NASAL | Status: DC
  Administered 2013-11-17 – 2013-11-18 (×4): 2 via NASAL
  Filled 2013-11-13 (×2): qty 30

## 2013-11-13 MED ORDER — OMEPRAZOLE MAGNESIUM 20 MG PO TBEC: 20.0000 mg | DELAYED_RELEASE_TABLET | Freq: Every day | ORAL | Status: DC

## 2013-11-13 MED ORDER — MORPHINE SULFATE (PF) 1 MG/ML IV SOLN
INTRAVENOUS | Status: AC
  Administered 2013-11-13: 15:00:00
  Filled 2013-11-13: qty 25

## 2013-11-13 MED ORDER — NEOSTIGMINE METHYLSULFATE 1 MG/ML IJ SOLN
INTRAMUSCULAR | Status: DC | PRN
  Administered 2013-11-13: 4 mg via INTRAVENOUS

## 2013-11-13 MED ORDER — PROPOFOL 10 MG/ML IV BOLUS
INTRAVENOUS | Status: AC
  Filled 2013-11-13: qty 20

## 2013-11-13 MED ORDER — LACTATED RINGERS IR SOLN
Status: DC | PRN
  Administered 2013-11-13: 1

## 2013-11-13 MED ORDER — SUCCINYLCHOLINE CHLORIDE 20 MG/ML IJ SOLN
INTRAMUSCULAR | Status: DC | PRN
  Administered 2013-11-13: 140 mg via INTRAVENOUS

## 2013-11-13 MED ORDER — LIP MEDEX EX OINT
TOPICAL_OINTMENT | CUTANEOUS | Status: AC
  Filled 2013-11-13: qty 7

## 2013-11-13 MED ORDER — BUPIVACAINE HCL (PF) 0.5 % IJ SOLN
INTRAMUSCULAR | Status: DC | PRN
  Administered 2013-11-13: 18 mL

## 2013-11-13 MED ORDER — SUCCINYLCHOLINE CHLORIDE 20 MG/ML IJ SOLN
INTRAMUSCULAR | Status: AC
  Filled 2013-11-13: qty 1

## 2013-11-13 MED ORDER — SODIUM CHLORIDE 0.9 % IV SOLN
INTRAVENOUS | Status: AC
  Filled 2013-11-13: qty 3

## 2013-11-13 MED ORDER — PANTOPRAZOLE SODIUM 40 MG PO TBEC
40.0000 mg | DELAYED_RELEASE_TABLET | Freq: Every day | ORAL | Status: DC
  Filled 2013-11-13 (×3): qty 1

## 2013-11-13 MED ORDER — FENTANYL CITRATE 0.05 MG/ML IJ SOLN
25.0000 ug | INTRAMUSCULAR | Status: DC | PRN
  Administered 2013-11-13 (×2): 50 ug via INTRAVENOUS

## 2013-11-13 SURGICAL SUPPLY — 77 items
APL SKNCLS STERI-STRIP NONHPOA (GAUZE/BANDAGES/DRESSINGS) ×1
APPLIER CLIP 5 13 M/L LIGAMAX5 (MISCELLANEOUS)
APPLIER CLIP ROT 10 11.4 M/L (STAPLE)
APR CLP MED LRG 11.4X10 (STAPLE)
APR CLP MED LRG 5 ANG JAW (MISCELLANEOUS)
BAG SPEC RTRVL LRG 6X4 10 (ENDOMECHANICALS) ×1
BENZOIN TINCTURE PRP APPL 2/3 (GAUZE/BANDAGES/DRESSINGS) ×3 IMPLANT
BLADE SURG SZ10 CARB STEEL (BLADE) ×2 IMPLANT
CANISTER SUCTION 2500CC (MISCELLANEOUS) ×1 IMPLANT
CELLS DAT CNTRL 66122 CELL SVR (MISCELLANEOUS) ×1 IMPLANT
CHLORAPREP W/TINT 26ML (MISCELLANEOUS) ×3 IMPLANT
CLIP APPLIE 5 13 M/L LIGAMAX5 (MISCELLANEOUS) IMPLANT
CLIP APPLIE ROT 10 11.4 M/L (STAPLE) IMPLANT
CLOSURE WOUND 1/2 X4 (GAUZE/BANDAGES/DRESSINGS)
CUTTER FLEX LINEAR 45M (STAPLE) IMPLANT
DECANTER SPIKE VIAL GLASS SM (MISCELLANEOUS) ×1 IMPLANT
DISSECTOR BLUNT TIP ENDO 5MM (MISCELLANEOUS) ×2 IMPLANT
DRAPE CAMERA CLOSED 9X96 (DRAPES) ×2 IMPLANT
DRAPE LAPAROSCOPIC ABDOMINAL (DRAPES) ×3 IMPLANT
DRAPE UTILITY XL STRL (DRAPES) ×3 IMPLANT
DRAPE WARM FLUID 44X44 (DRAPE) ×2 IMPLANT
DRSG TEGADERM 2-3/8X2-3/4 SM (GAUZE/BANDAGES/DRESSINGS) ×11 IMPLANT
DRSG TELFA 3X8 NADH (GAUZE/BANDAGES/DRESSINGS) ×3 IMPLANT
ELECT CAUTERY BLADE 6.4 (BLADE) ×2 IMPLANT
ELECT REM PT RETURN 9FT ADLT (ELECTROSURGICAL) ×3
ELECTRODE REM PT RTRN 9FT ADLT (ELECTROSURGICAL) ×1 IMPLANT
ENDOLOOP SUT PDS II  0 18 (SUTURE)
ENDOLOOP SUT PDS II 0 18 (SUTURE) IMPLANT
GAUZE SPONGE 2X2 8PLY STRL LF (GAUZE/BANDAGES/DRESSINGS) IMPLANT
GLOVE ECLIPSE 8.0 STRL XLNG CF (GLOVE) ×3 IMPLANT
GLOVE INDICATOR 8.0 STRL GRN (GLOVE) ×6 IMPLANT
GOWN STRL REUS W/TWL LRG LVL3 (GOWN DISPOSABLE) ×3 IMPLANT
GOWN STRL REUS W/TWL XL LVL3 (GOWN DISPOSABLE) ×6 IMPLANT
KIT BASIN OR (CUSTOM PROCEDURE TRAY) ×3 IMPLANT
LIGASURE IMPACT 36 18CM CVD LR (INSTRUMENTS) ×2 IMPLANT
PAD ABD 8X10 STRL (GAUZE/BANDAGES/DRESSINGS) ×2 IMPLANT
PAD DRESSING TELFA 3X8 NADH (GAUZE/BANDAGES/DRESSINGS) IMPLANT
PAD TELFA 2X3 NADH STRL (GAUZE/BANDAGES/DRESSINGS) ×2 IMPLANT
PENCIL BUTTON HOLSTER BLD 10FT (ELECTRODE) ×3 IMPLANT
POUCH SPECIMEN RETRIEVAL 10MM (ENDOMECHANICALS) ×2 IMPLANT
RELOAD 45 VASCULAR/THIN (ENDOMECHANICALS) IMPLANT
RELOAD PROXIMATE 75MM BLUE (ENDOMECHANICALS) ×9 IMPLANT
RELOAD STAPLE 45 2.5 WHT GRN (ENDOMECHANICALS) IMPLANT
RELOAD STAPLE 45 3.5 BLU ETS (ENDOMECHANICALS) IMPLANT
RELOAD STAPLE 75 3.8 BLU REG (ENDOMECHANICALS) IMPLANT
RELOAD STAPLE TA45 3.5 REG BLU (ENDOMECHANICALS) IMPLANT
RETRACTOR WND ALEXIS 18 MED (MISCELLANEOUS) IMPLANT
RTRCTR WOUND ALEXIS 18CM MED (MISCELLANEOUS) ×3
SCALPEL HARMONIC ACE (MISCELLANEOUS) ×2 IMPLANT
SET IRRIG TUBING LAPAROSCOPIC (IRRIGATION / IRRIGATOR) ×3 IMPLANT
SLEEVE SURGEON STRL (DRAPES) ×2 IMPLANT
SOLUTION ANTI FOG 6CC (MISCELLANEOUS) ×3 IMPLANT
SPONGE DRAIN TRACH 4X4 STRL 2S (GAUZE/BANDAGES/DRESSINGS) ×2 IMPLANT
SPONGE GAUZE 2X2 STER 10/PKG (GAUZE/BANDAGES/DRESSINGS) ×2
SPONGE GAUZE 4X4 12PLY (GAUZE/BANDAGES/DRESSINGS) ×2 IMPLANT
SPONGE LAP 18X18 X RAY DECT (DISPOSABLE) ×4 IMPLANT
STRIP CLOSURE SKIN 1/2X4 (GAUZE/BANDAGES/DRESSINGS) ×1 IMPLANT
SUCTION POOLE TIP (SUCTIONS) ×2 IMPLANT
SUT ETHILON 2 0 PS N (SUTURE) ×2 IMPLANT
SUT ETHILON 3 0 PS 1 (SUTURE) ×2 IMPLANT
SUT MNCRL AB 4-0 PS2 18 (SUTURE) ×3 IMPLANT
SUT PDS AB 1 TP1 96 (SUTURE) ×4 IMPLANT
SUT SILK 3 0 SH CR/8 (SUTURE) ×2 IMPLANT
SUT VIC AB 3-0 SH 27 (SUTURE) ×3
SUT VIC AB 3-0 SH 27X BRD (SUTURE) IMPLANT
SUT VIC AB 3-0 SH 8-18 (SUTURE) ×2 IMPLANT
TAPE CLOTH SURG 4X10 WHT LF (GAUZE/BANDAGES/DRESSINGS) ×2 IMPLANT
TOWEL OR 17X26 10 PK STRL BLUE (TOWEL DISPOSABLE) ×5 IMPLANT
TOWEL OR NON WOVEN STRL DISP B (DISPOSABLE) ×5 IMPLANT
TRAY FOLEY CATH 14FRSI W/METER (CATHETERS) ×3 IMPLANT
TRAY LAP CHOLE (CUSTOM PROCEDURE TRAY) ×3 IMPLANT
TROCAR BLADELESS OPT 5 100 (ENDOMECHANICALS) ×2 IMPLANT
TROCAR BLADELESS OPT 5 75 (ENDOMECHANICALS) ×6 IMPLANT
TROCAR XCEL 12X100 BLDLESS (ENDOMECHANICALS) ×2 IMPLANT
TROCAR XCEL BLUNT TIP 100MML (ENDOMECHANICALS) ×3 IMPLANT
TUBING INSUFFLATION 10FT LAP (TUBING) ×3 IMPLANT
YANKAUER SUCT BULB TIP NO VENT (SUCTIONS) ×4 IMPLANT

## 2013-11-13 NOTE — H&P (Addendum)
Linda Odom is an 33 y.o. female.   Chief Complaint: Persistent right lower quadrant abdominal pain HPI: She had the onset of diffuse abdominal pain 2 days ago. She had a bowel movement but did not get relief. The pain persisted. Then migrated to the right lower quadrant. She presented to the emergency department and a CT scan was performed. Findings were consistent with acute appendicitis without perforation. No fever. He has had some chills. Currently menstruating.  She had an upper respiratory infection within the last month.  She's also been having abdominal pain off and on for the past month.  Past Medical History  Diagnosis Date  . Breast nodule   . No pertinent past medical history   . Gastric ulcer     Past Surgical History  Procedure Laterality Date  . Cholecystectomy    . Tubal ligation    . Leg sugery  left leg    Family History  Problem Relation Age of Onset  . Hypertension Mother   . Diabetes Mother   . Hypertension Maternal Grandmother   . Other Neg Hx    Social History:  reports that she has been smoking.  She has never used smokeless tobacco. She reports that she drinks alcohol. She reports that she does not use illicit drugs.  Allergies: No Known Allergies  Prior to Admission medications   Medication Sig Start Date End Date Taking? Authorizing Provider  ipratropium (ATROVENT) 0.06 % nasal spray Place 2 sprays into both nostrils 4 (four) times daily. 09/08/13  Yes Gregor Hams, MD  omeprazole (PRILOSEC OTC) 20 MG tablet Take 1 tablet (20 mg total) by mouth daily. 08/12/12 11/13/13 Yes Manya Silvas, CNM  ondansetron (ZOFRAN) 4 MG tablet Take 4 mg by mouth every 8 (eight) hours as needed for nausea or vomiting.   Yes Historical Provider, MD     (Not in a hospital admission)  Results for orders placed during the hospital encounter of 11/13/13 (from the past 48 hour(s))  COMPREHENSIVE METABOLIC PANEL     Status: Abnormal   Collection Time    11/13/13  5:15 AM       Result Value Ref Range   Sodium 139  137 - 147 mEq/L   Potassium 4.2  3.7 - 5.3 mEq/L   Chloride 104  96 - 112 mEq/L   CO2 24  19 - 32 mEq/L   Glucose, Bld 92  70 - 99 mg/dL   BUN 7  6 - 23 mg/dL   Creatinine, Ser 0.70  0.50 - 1.10 mg/dL   Calcium 9.1  8.4 - 10.5 mg/dL   Total Protein 7.3  6.0 - 8.3 g/dL   Albumin 3.1 (*) 3.5 - 5.2 g/dL   AST 21  0 - 37 U/L   ALT 28  0 - 35 U/L   Alkaline Phosphatase 71  39 - 117 U/L   Total Bilirubin 0.3  0.3 - 1.2 mg/dL   GFR calc non Af Amer >90  >90 mL/min   GFR calc Af Amer >90  >90 mL/min   Comment: (NOTE)     The eGFR has been calculated using the CKD EPI equation.     This calculation has not been validated in all clinical situations.     eGFR's persistently <90 mL/min signify possible Chronic Kidney     Disease.  CBC WITH DIFFERENTIAL     Status: None   Collection Time    11/13/13  5:15 AM  Result Value Ref Range   WBC 6.7  4.0 - 10.5 K/uL   RBC 4.26  3.87 - 5.11 MIL/uL   Hemoglobin 12.0  12.0 - 15.0 g/dL   HCT 36.4  36.0 - 46.0 %   MCV 85.4  78.0 - 100.0 fL   MCH 28.2  26.0 - 34.0 pg   MCHC 33.0  30.0 - 36.0 g/dL   RDW 14.2  11.5 - 15.5 %   Platelets 221  150 - 400 K/uL   Neutrophils Relative % 51  43 - 77 %   Neutro Abs 3.4  1.7 - 7.7 K/uL   Lymphocytes Relative 42  12 - 46 %   Lymphs Abs 2.8  0.7 - 4.0 K/uL   Monocytes Relative 6  3 - 12 %   Monocytes Absolute 0.4  0.1 - 1.0 K/uL   Eosinophils Relative 1  0 - 5 %   Eosinophils Absolute 0.1  0.0 - 0.7 K/uL   Basophils Relative 0  0 - 1 %   Basophils Absolute 0.0  0.0 - 0.1 K/uL  LIPASE, BLOOD     Status: None   Collection Time    11/13/13  5:15 AM      Result Value Ref Range   Lipase 20  11 - 59 U/L  URINALYSIS, ROUTINE W REFLEX MICROSCOPIC     Status: Abnormal   Collection Time    11/13/13  6:38 AM      Result Value Ref Range   Color, Urine YELLOW  YELLOW   APPearance TURBID (*) CLEAR   Specific Gravity, Urine 1.027  1.005 - 1.030   pH 5.5  5.0 - 8.0    Glucose, UA NEGATIVE  NEGATIVE mg/dL   Hgb urine dipstick TRACE (*) NEGATIVE   Bilirubin Urine NEGATIVE  NEGATIVE   Ketones, ur NEGATIVE  NEGATIVE mg/dL   Protein, ur NEGATIVE  NEGATIVE mg/dL   Urobilinogen, UA 0.2  0.0 - 1.0 mg/dL   Nitrite POSITIVE (*) NEGATIVE   Leukocytes, UA NEGATIVE  NEGATIVE  URINE MICROSCOPIC-ADD ON     Status: Abnormal   Collection Time    11/13/13  6:38 AM      Result Value Ref Range   Squamous Epithelial / LPF FEW (*) RARE   WBC, UA 0-2  <3 WBC/hpf   Bacteria, UA MANY (*) RARE   Urine-Other MUCOUS PRESENT    POC URINE PREG, ED     Status: None   Collection Time    11/13/13  6:43 AM      Result Value Ref Range   Preg Test, Ur NEGATIVE  NEGATIVE   Comment:            THE SENSITIVITY OF THIS     METHODOLOGY IS >24 mIU/mL   Ct Abdomen Pelvis W Contrast  11/13/2013   CLINICAL DATA:  Right lower quadrant abdominal pain  EXAM: CT ABDOMEN AND PELVIS WITH CONTRAST  TECHNIQUE: Multidetector CT imaging of the abdomen and pelvis was performed using the standard protocol following bolus administration of intravenous contrast.  CONTRAST:  62m OMNIPAQUE IOHEXOL 300 MG/ML SOLN, 1038mOMNIPAQUE IOHEXOL 300 MG/ML SOLN  COMPARISON:  None.  FINDINGS: BODY WALL: Unremarkable.  LOWER CHEST: Unremarkable.  ABDOMEN/PELVIS:  Liver: No focal abnormality.  Biliary: Cholecystectomy.  Pancreas: Unremarkable.  Spleen: Unremarkable.  Adrenals: Unremarkable.  Kidneys and ureters: No hydronephrosis or stone.  Bladder: Unremarkable.  Reproductive: Unremarkable.  Bowel: There is marked thickening of the appendix wall, with central fluid filling.  Local peritoneal enhancement and lymphadenopathy. Although the changes are extensive, there is no definitive perforation or abscess. Reactive inflammation of the cecum.  Retroperitoneum: Ileocolic adenopathy, reactive.  OSSEOUS: No acute abnormalities.  Critical Value/emergent results were called by telephone at the time of interpretation on 11/13/2013 at  7:24 AM to Dr. Elyn Peers , who verbally acknowledged these results.  IMPRESSION: Acute appendicitis. The inflammation is advanced, but no perforation or abscess.   Electronically Signed   By: Jorje Guild M.D.   On: 11/13/2013 07:27    Review of Systems  Constitutional: Positive for chills. Negative for fever.  Respiratory: Negative for cough.   Gastrointestinal: Positive for abdominal pain. Negative for nausea, vomiting and diarrhea.  Genitourinary: Negative for dysuria and hematuria.  Neurological: Positive for headaches.    Blood pressure 103/68, pulse 68, temperature 98 F (36.7 C), temperature source Oral, resp. rate 16, height _0  (1.575 m), weight 232 lb (105.235 kg), last menstrual period 11/08/2013, SpO2 99.00%. Physical Exam  Constitutional: No distress.  Morbidly obese female.  HENT:  Head: Normocephalic and atraumatic.  Cardiovascular: Normal rate and regular rhythm.   Respiratory: Effort normal and breath sounds normal.  GI: Soft. She exhibits no distension. There is tenderness (mild in RLQ). There is no guarding.  Small subumbilical scar. Small supraumbilical scar. Small upper abdominal scars.  Musculoskeletal: She exhibits no edema.  Lymphadenopathy:    She has no cervical adenopathy.  Neurological: She is alert.  Skin: Skin is warm and dry.  Psychiatric: She has a normal mood and affect. Her behavior is normal.     Assessment/Plan Acute appendicitis without evidence of perforation or abscess.  IV antibiotics have been ordered.  Plan: Laparoscopic possible open appendectomy.  I have discussed the procedure and risks of appendectomy. The risks include but are not limited to bleeding, infection, wound problems, anesthesia, injury to intra-abdominal organs, possibility of postoperative ileus. She seems to understand and agrees with the plan.    Lagena Strand J 11/13/2013, 8:02 AM

## 2013-11-13 NOTE — Anesthesia Postprocedure Evaluation (Signed)
  Anesthesia Post-op Note  Patient: Linda Odom  Procedure(s) Performed: Procedure(s): LAP ASSISTED PARTIAL CECECTOMY (N/A)  Patient Location: PACU  Anesthesia Type:General  Level of Consciousness: awake, alert , oriented and patient cooperative  Airway and Oxygen Therapy: Patient connected to face mask oxygen  Post-op Pain: moderate  Post-op Assessment: Post-op Vital signs reviewed, Patient's Cardiovascular Status Stable, Respiratory Function Stable, Patent Airway, No signs of Nausea or vomiting and Pain level not controlled, PCA started, additional pain meds given   Post-op Vital Signs: Reviewed and stable  Complications: No apparent anesthesia complications

## 2013-11-13 NOTE — ED Notes (Signed)
Dr. Zella Richer is speaking with her as I write this.  She is awake, alert and in no distress and agrees with plan to operate.

## 2013-11-13 NOTE — ED Notes (Signed)
Per surgeon cancelling type and screen. Nurse was notified.

## 2013-11-13 NOTE — ED Provider Notes (Signed)
CSN: [id number]     Arrival date & time 11/13/13  [id number] History   First MD Initiated Contact with Patient 11/13/13 [phone]     Chief Complaint  Patient presents with  . Abdominal Pain     (Consider location/radiation/quality/duration/timing/severity/associated sxs/prior Treatment) HPI Patient is an obese 33 yo woman who presents with complaints of diffuse abdominal pain for two days. She is unable to localize her pain further. She moves her hands all over her abdomen when I ask her to show me the location of her pain.   The pain is aching, constant. It is worse with moving around and sitting up. The patient denies fever, vomiting and diarrhea. She denies GU sx.   She says she has had similar pain in the past, remotely.    Past Medical History  Diagnosis Date  . Breast nodule   . No pertinent past medical history   . Gastric ulcer    Past Surgical History  Procedure Laterality Date  . Cholecystectomy    . Tubal ligation    . Leg sugery  left leg   Family History  Problem Relation Age of Onset  . Hypertension Mother   . Diabetes Mother   . Hypertension Maternal Grandmother   . Other Neg Hx    History  Substance Use Topics  . Smoking status: Current Some Day Smoker -- 17.00 packs/day  . Smokeless tobacco: Never Used  . Alcohol Use: Yes     Comment: occ   OB History   Grav Para Term Preterm Abortions TAB SAB Ect Mult Living   5 3 3  2  2   3      Review of Systems Ten point review of symptoms performed and is negative with the exception of symptoms noted above.     Allergies  Review of patient's allergies indicates no known allergies.  Home Medications   Current Outpatient Rx  Name  Route  Sig  Dispense  Refill  . ipratropium (ATROVENT) 0.06 % nasal spray   Each Nare   Place 2 sprays into both nostrils 4 (four) times daily.   15 mL   1   . omeprazole (PRILOSEC OTC) 20 MG tablet   Oral   Take 1 tablet (20 mg total) by mouth daily.   28 tablet   1   .  ondansetron (ZOFRAN) 4 MG tablet   Oral   Take 4 mg by mouth every 8 (eight) hours as needed for nausea or vomiting.          BP 111/76  Pulse 90  Temp(Src) 98.6 F (37 C)  Resp 20  Ht 5\' 2"  (1.575 m)  Wt 232 lb (105.235 kg)  BMI 42.42 kg/m2  SpO2 99%  LMP 11/08/2013 Physical Exam Gen: well developed and well nourished appearing Head: NCAT Eyes: PERL, EOMI Nose: no epistaixis or rhinorrhea Mouth/throat: mucosa is moist and pink Neck: supple, no stridor Lungs: CTA B, no wheezing, rhonchi or rales CV: regular rate and rythm, good distal pulses.  Abd: soft, morbidly obese, ttp over the RLQ without rebound or gaurding, nondistended Back: no ttp, no cva ttp Skin: warm and dry Ext: no edema, normal to inspection Neuro: CN ii-xii grossly intact, no focal deficits Psyche; normal affect,  calm and cooperative.  ED Course  Procedures (including critical care time)  Results for orders placed during the hospital encounter of 11/13/13 (from the past 24 hour(s))  COMPREHENSIVE METABOLIC PANEL     Status: Abnormal   Collection  Time    11/13/13  5:15 AM      Result Value Ref Range   Sodium 139  137 - 147 mEq/L   Potassium 4.2  3.7 - 5.3 mEq/L   Chloride 104  96 - 112 mEq/L   CO2 24  19 - 32 mEq/L   Glucose, Bld 92  70 - 99 mg/dL   BUN 7  6 - 23 mg/dL   Creatinine, Ser 0.70  0.50 - 1.10 mg/dL   Calcium 9.1  8.4 - 10.5 mg/dL   Total Protein 7.3  6.0 - 8.3 g/dL   Albumin 3.1 (*) 3.5 - 5.2 g/dL   AST 21  0 - 37 U/L   ALT 28  0 - 35 U/L   Alkaline Phosphatase 71  39 - 117 U/L   Total Bilirubin 0.3  0.3 - 1.2 mg/dL   GFR calc non Af Amer >90  >90 mL/min   GFR calc Af Amer >90  >90 mL/min  CBC WITH DIFFERENTIAL     Status: None   Collection Time    11/13/13  5:15 AM      Result Value Ref Range   WBC 6.7  4.0 - 10.5 K/uL   RBC 4.26  3.87 - 5.11 MIL/uL   Hemoglobin 12.0  12.0 - 15.0 g/dL   HCT 36.4  36.0 - 46.0 %   MCV 85.4  78.0 - 100.0 fL   MCH 28.2  26.0 - 34.0 pg   MCHC  33.0  30.0 - 36.0 g/dL   RDW 14.2  11.5 - 15.5 %   Platelets 221  150 - 400 K/uL   Neutrophils Relative % 51  43 - 77 %   Neutro Abs 3.4  1.7 - 7.7 K/uL   Lymphocytes Relative 42  12 - 46 %   Lymphs Abs 2.8  0.7 - 4.0 K/uL   Monocytes Relative 6  3 - 12 %   Monocytes Absolute 0.4  0.1 - 1.0 K/uL   Eosinophils Relative 1  0 - 5 %   Eosinophils Absolute 0.1  0.0 - 0.7 K/uL   Basophils Relative 0  0 - 1 %   Basophils Absolute 0.0  0.0 - 0.1 K/uL  LIPASE, BLOOD     Status: None   Collection Time    11/13/13  5:15 AM      Result Value Ref Range   Lipase 20  11 - 59 U/L  URINALYSIS, ROUTINE W REFLEX MICROSCOPIC     Status: Abnormal   Collection Time    11/13/13  6:38 AM      Result Value Ref Range   Color, Urine YELLOW  YELLOW   APPearance TURBID (*) CLEAR   Specific Gravity, Urine 1.027  1.005 - 1.030   pH 5.5  5.0 - 8.0   Glucose, UA NEGATIVE  NEGATIVE mg/dL   Hgb urine dipstick TRACE (*) NEGATIVE   Bilirubin Urine NEGATIVE  NEGATIVE   Ketones, ur NEGATIVE  NEGATIVE mg/dL   Protein, ur NEGATIVE  NEGATIVE mg/dL   Urobilinogen, UA 0.2  0.0 - 1.0 mg/dL   Nitrite POSITIVE (*) NEGATIVE   Leukocytes, UA NEGATIVE  NEGATIVE  URINE MICROSCOPIC-ADD ON     Status: Abnormal   Collection Time    11/13/13  6:38 AM      Result Value Ref Range   Squamous Epithelial / LPF FEW (*) RARE   WBC, UA 0-2  <3 WBC/hpf   Bacteria, UA MANY (*) RARE  Urine-Other MUCOUS PRESENT    POC URINE PREG, ED     Status: None   Collection Time    11/13/13  6:43 AM      Result Value Ref Range   Preg Test, Ur NEGATIVE  NEGATIVE     MDM   Patient with RLQ abdominal pain and ttp. VS are wnl. Labs normal. CT abd/pelvis pending to rule out appendicitis although I consider this a remote possiblity. If this study is negative for acute abdominal process, the plan is to discharge home with plan for symptomatic management.   0720:  Patient with findings of acute appendicitis on CT scan - results called by Dr.  Shon Baton of Radiology. We will initiate maintenance IVF and tx with Unasyn IV. [facility] paged.   Elyn Peers, MD 11/13/13 [phone]

## 2013-11-13 NOTE — Transfer of Care (Signed)
Immediate Anesthesia Transfer of Care Note  Patient: Linda Odom  Procedure(s) Performed: Procedure(s) (LRB): LAP ASSISTED PARTIAL CECECTOMY (N/A)  Patient Location: PACU  Anesthesia Type: General  Level of Consciousness: sedated, patient cooperative and responds to stimulation  Airway & Oxygen Therapy: Patient Spontanous Breathing and Patient connected to face mask oxgen  Post-op Assessment: Report given to PACU RN and Post -op Vital signs reviewed and stable  Post vital signs: Reviewed and stable  Complications: No apparent anesthesia complications

## 2013-11-13 NOTE — Preoperative (Signed)
Beta Blockers   Reason not to administer Beta Blockers:Not Applicable 

## 2013-11-13 NOTE — Op Note (Signed)
Operative Note  Linda Odom female 33 y.o. 11/13/2013  PREOPERATIVE DX:  Acute appendicitis  POSTOPERATIVE DX:  Appendicitis with cecal inflammatory mass  PROCEDURE:    Laparoscopic-assisted ileocecectomy.  Biopsy of peritoneal nodule.         Surgeon: Odis Hollingshead   Assistants: Autumn Messing , M.D.  Anesthesia: General endotracheal anesthesia  Indications: This is a 33 year old female who is been having abdominal pain off and on for the past month. 2 days ago the pain worsened and migrated to the right lower quadrant. She presented to the emergency department. CT scan was consistent with acute appendicitis and surrounding inflammation. She is now brought to the operating room.    Procedure Detail:  She was brought to the operating room placed supine on the operating table and a general anesthetic was given. A Foley catheter was inserted. Oral gastric tube was inserted. The abdominal wall was widely sterilely prepped and draped. A timeout was performed.  Marcaine was infiltrated in the subumbilical region. A previous small subumbilical scar was entered sharply and the subcutaneous tissue dissected bluntly. A small incision was made in the anterior fascia. The scarred there was dense. I used careful dissection to try to get into the peritoneal cavity but because of the dense scar this was difficult. Subsequently, I made a small incision in the right upper quadrant and using a 5 mm Optiview trocar I gained access into the peritoneal cavity and create a pneumoperitoneum. Visualization of the area under the trocar demonstrated no evidence of bleeding or organ injury. I was subsequently able to put in a 12 mm trocar in the oblique fashion through the subumbilical incision. A 5 mm trocar was placed in the right lower quadrant.  In evaluating the right lower quadrant area there is an inflammatory process occurring was some mucinous-like changes. Inflammatory processes in the cecal area was  densely adherent to the abdominal wall. A begin using some blunt dissection to try to free this process of the abdominal wall. I could see the tip of the appendix. The appendix appeared to be indurated and inflamed. There is a small peritoneal nodule the area and I biopsied this.  I then began trying to mobilize the inflammatory process using blunt dissection and harmonic scalpel. The terminal ileum was also adherent to this process. There appeared to be a firm mass in the cecal area. I began mobilizing the right colon in anticipation of possibly needing to do an ileocecectomy. The lateral attachments were divided with the harmonic scalpel. I dissected the base of the appendix as much as I could but realizes that given the dense inflammatory mass in the cecum I was not going to be able to remove the appendix and have a solid closure at the cecal area. Subsequently I freed up a loop of morbid small intestine. I freed up some of the descending colon just distal to the hepatic flexure so that the ascending colon was mobile and could be brought down to the right lower quadrant area.  I then made a right lower quadrant incision through the skin and subcutaneous tissue fascia and peritoneum under laparoscopic guidance. Once into the peritoneal cavity I could feel a dense inflammatory mass adherent to the posterior body wall. This was in the area of the cecum and the appendix. Using blunt dissection and finger fracture technique I was able to free this up. I plan to do an ileocecectomy. I divided the ileum just proximal to the inflammatory process where it appeared  normal using a GIA stapler. I then divided the ascending colon just proximal to the inflammatory process using the GIA stapler. There was enlarged lymph node joint within the specimen. I then divided the mesentery using the LigaSure device. I then freed up the ileocecectomy specimen along with the appendix and passed this off the field to pathology.  I  inspected the area, irrigated it, and hemostasis appeared adequate.  A side-to-side anastomosis was then performed between this distal ileum and the mid descending colon using the linear cutting stapler. The anastomotic staple lines were hemostatic. The common defect was closed in 2 layers. The first layer was a full-thickness running 2-0 Vicryl suture closure. The second layer was interrupted 3-0 silk Lembert sutures. A crotch stitch of 3-0 silk was placed. The anastomosis was patent, viable, and under no tension. A #19 Blake drain was then placed through the left lower quadrant trocar site and positioned in the right gutter.  It was anchored to the skin with 3-0 nylon suture.  I then copiously irrigated out the abdominal cavity removed the remaining trocars.  I closed the fascia in a single layer with a running double looped #1 PDS suture.   Needle, sponge, instrument counts were reported to be correct prior to closure.The subcutaneous tissues copiously irrigated and the skin was closed loosely with staples. Telfa wicks were placed between the staples. The 2 remaining trocar sites were closed with staples.  The drain was hooked up to closed suction. Sterile dressings were applied to all wounds. And the drain site.  She tolerated the procedure well without any apparent complications and was taken to the recovery in satisfactory condition.    Findings:  There was a large inflammatory mass involving the cecum, appendix, and terminal ileum.   Estimated Blood Loss:  300 mL         Drains: #19 round Blake drain.  Blood Given: none          Specimens: Terminal ileum with cecum and appendix.        Complications:  * No complications entered in OR log *         Disposition: PACU - hemodynamically stable.         Condition: stable

## 2013-11-13 NOTE — ED Notes (Signed)
She is taken to O.R. At this time.  She remains in no distress.

## 2013-11-13 NOTE — Anesthesia Preprocedure Evaluation (Addendum)
Anesthesia Evaluation  Patient identified by MRN, date of birth, ID band Patient awake    Reviewed: Allergy & Precautions, H&P , Patient's Chart, lab work & pertinent test results, reviewed documented beta blocker date and time   History of Anesthesia Complications Negative for: history of anesthetic complications  Airway Mallampati: III TM Distance: >3 FB Neck ROM: full    Dental   Pulmonary Current Smoker,  breath sounds clear to auscultation        Cardiovascular Exercise Tolerance: Good Rhythm:regular Rate:Normal     Neuro/Psych    GI/Hepatic   Endo/Other  Morbid obesity  Renal/GU      Musculoskeletal   Abdominal   Peds  Hematology   Anesthesia Other Findings   Reproductive/Obstetrics                          Anesthesia Physical Anesthesia Plan  ASA: III and emergent  Anesthesia Plan: General ETT   Post-op Pain Management:    Induction: Rapid sequence, Cricoid pressure planned and Intravenous  Airway Management Planned: Oral ETT  Additional Equipment:   Intra-op Plan:   Post-operative Plan:   Informed Consent: I have reviewed the patients History and Physical, chart, labs and discussed the procedure including the risks, benefits and alternatives for the proposed anesthesia with the patient or authorized representative who has indicated his/her understanding and acceptance.   Dental Advisory Given  Plan Discussed with: CRNA and Surgeon  Anesthesia Plan Comments:        Anesthesia Quick Evaluation

## 2013-11-13 NOTE — ED Notes (Signed)
Pt c/o abd pain, headache, gum pain (missing tooth) and pain in her spine. Onset yesterday. Nausea yesterday.

## 2013-11-14 LAB — CBC
HEMATOCRIT: 33.3 % — AB (ref 36.0–46.0)
HEMOGLOBIN: 10.9 g/dL — AB (ref 12.0–15.0)
MCH: 27.7 pg (ref 26.0–34.0)
MCHC: 32.7 g/dL (ref 30.0–36.0)
MCV: 84.7 fL (ref 78.0–100.0)
Platelets: 200 10*3/uL (ref 150–400)
RBC: 3.93 MIL/uL (ref 3.87–5.11)
RDW: 13.9 % (ref 11.5–15.5)
WBC: 7.5 10*3/uL (ref 4.0–10.5)

## 2013-11-14 LAB — BASIC METABOLIC PANEL
BUN: 4 mg/dL — ABNORMAL LOW (ref 6–23)
CHLORIDE: 102 meq/L (ref 96–112)
CO2: 24 meq/L (ref 19–32)
Calcium: 8.5 mg/dL (ref 8.4–10.5)
Creatinine, Ser: 0.58 mg/dL (ref 0.50–1.10)
GFR calc Af Amer: >90 mL/min (ref >90)
GFR calc non Af Amer: >90 mL/min (ref >90)
GLUCOSE: 114 mg/dL — AB (ref 70–99)
POTASSIUM: 3.9 meq/L (ref 3.7–5.3)
Sodium: 136 mEq/L — ABNORMAL LOW (ref 137–147)

## 2013-11-14 MED ORDER — DIPHENHYDRAMINE HCL 50 MG/ML IJ SOLN
12.5000 mg | Freq: Four times a day (QID) | INTRAMUSCULAR | Status: DC | PRN
  Administered 2013-11-14 – 2013-11-16 (×3): 12.5 mg via INTRAVENOUS
  Filled 2013-11-14 (×3): qty 1

## 2013-11-14 MED ORDER — NALOXONE HCL 0.4 MG/ML IJ SOLN: 0.4000 mg | INTRAMUSCULAR | Status: DC | PRN

## 2013-11-14 MED ORDER — PANTOPRAZOLE SODIUM 40 MG IV SOLR
40.0000 mg | INTRAVENOUS | Status: DC
  Administered 2013-11-14 – 2013-11-17 (×4): 40 mg via INTRAVENOUS
  Filled 2013-11-14 (×6): qty 40

## 2013-11-14 MED ORDER — SODIUM CHLORIDE 0.9 % IJ SOLN: 9.0000 mL | INTRAMUSCULAR | Status: DC | PRN

## 2013-11-14 MED ORDER — DIPHENHYDRAMINE HCL 12.5 MG/5ML PO ELIX: 12.5000 mg | ORAL_SOLUTION | Freq: Four times a day (QID) | ORAL | Status: DC | PRN

## 2013-11-14 MED ORDER — KETOROLAC TROMETHAMINE 30 MG/ML IJ SOLN
30.0000 mg | Freq: Three times a day (TID) | INTRAMUSCULAR | Status: AC
  Administered 2013-11-14 – 2013-11-15 (×5): 30 mg via INTRAVENOUS
  Filled 2013-11-14: qty 2
  Filled 2013-11-14 (×6): qty 1

## 2013-11-14 MED ORDER — HYDROMORPHONE 0.3 MG/ML IV SOLN
INTRAVENOUS | Status: DC
  Administered 2013-11-14 (×2): via INTRAVENOUS
  Administered 2013-11-14: 2.1 mg via INTRAVENOUS
  Administered 2013-11-14: 1.5 mg via INTRAVENOUS
  Administered 2013-11-14: 2.7 mg via INTRAVENOUS
  Administered 2013-11-15: 0.3 mg via INTRAVENOUS
  Administered 2013-11-15: 1.13 mg via INTRAVENOUS
  Administered 2013-11-15: 4.2 mg via INTRAVENOUS
  Administered 2013-11-15: 2.4 mg via INTRAVENOUS
  Administered 2013-11-15: 0.6 mg via INTRAVENOUS
  Administered 2013-11-15: 2.7 mg via INTRAVENOUS
  Administered 2013-11-15: 0.3 mg via INTRAVENOUS
  Administered 2013-11-16: 2.6 mg via INTRAVENOUS
  Administered 2013-11-16: 0.9 mg via INTRAVENOUS
  Administered 2013-11-16: 3.3 mg via INTRAVENOUS
  Administered 2013-11-16: 0.6 mg via INTRAVENOUS
  Administered 2013-11-16: 2.7 mg via INTRAVENOUS
  Administered 2013-11-17: 0.6 mg via INTRAVENOUS
  Administered 2013-11-17: 2.1 mg via INTRAVENOUS
  Administered 2013-11-17: 3.36 mg via INTRAVENOUS
  Administered 2013-11-17 (×2): 0.9 mg via INTRAVENOUS
  Administered 2013-11-17: 3.6 mg via INTRAVENOUS
  Administered 2013-11-17 – 2013-11-18 (×2): 1.5 mg via INTRAVENOUS
  Administered 2013-11-18: 1.2 mg via INTRAVENOUS
  Filled 2013-11-14 (×8): qty 25

## 2013-11-14 MED ORDER — ONDANSETRON HCL 4 MG/2ML IJ SOLN
4.0000 mg | Freq: Four times a day (QID) | INTRAMUSCULAR | Status: DC | PRN
  Administered 2013-11-14 – 2013-11-17 (×3): 4 mg via INTRAVENOUS
  Filled 2013-11-14 (×2): qty 2

## 2013-11-14 NOTE — Progress Notes (Signed)
Utilization Review Completed.   Emoree Sasaki, RN, BSN Nurse Case Manager  

## 2013-11-14 NOTE — Progress Notes (Signed)
1 Day Post-Op  Subjective: Complains of pain. No nausea  Objective: Vital signs in last 24 hours: Temp:  [97.6 F (36.4 C)-99.1 F (37.3 C)] 98.3 F (36.8 C) (03/08 0510) Pulse Rate:  [67-103] 93 (03/08 0510) Resp:  [11-25] 15 (03/08 0832) BP: (104-135)/(69-93) 109/72 mmHg (03/08 0510) SpO2:  [9 %-100 %] 9 % (03/08 0832)    Intake/Output from previous day: 03/07 0701 - 03/08 0700 In: 3031.7 [I.V.:2931.7; IV Piggyback:100] Out: 1440 [Urine:1050; Drains:190; Blood:200] Intake/Output this shift:    Resp: clear to auscultation bilaterally Cardio: regular rate and rhythm GI: soft, appropriately tender. dressings clean  Lab Results:   Recent Labs  11/13/13 0515 11/14/13 0556  WBC 6.7 7.5  HGB 12.0 10.9*  HCT 36.4 33.3*  PLT 221 200   BMET  Recent Labs  11/13/13 0515 11/14/13 0556  NA 139 136*  K 4.2 3.9  CL 104 102  CO2 24 24  GLUCOSE 92 114*  BUN 7 4*  CREATININE 0.70 0.58  CALCIUM 9.1 8.5   PT/INR No results found for this basename: LABPROT, INR,  in the last 72 hours ABG No results found for this basename: PHART, PCO2, PO2, HCO3,  in the last 72 hours  Studies/Results: Ct Abdomen Pelvis W Contrast  11/13/2013   CLINICAL DATA:  Right lower quadrant abdominal pain  EXAM: CT ABDOMEN AND PELVIS WITH CONTRAST  TECHNIQUE: Multidetector CT imaging of the abdomen and pelvis was performed using the standard protocol following bolus administration of intravenous contrast.  CONTRAST:  76mL OMNIPAQUE IOHEXOL 300 MG/ML SOLN, 123mL OMNIPAQUE IOHEXOL 300 MG/ML SOLN  COMPARISON:  None.  FINDINGS: BODY WALL: Unremarkable.  LOWER CHEST: Unremarkable.  ABDOMEN/PELVIS:  Liver: No focal abnormality.  Biliary: Cholecystectomy.  Pancreas: Unremarkable.  Spleen: Unremarkable.  Adrenals: Unremarkable.  Kidneys and ureters: No hydronephrosis or stone.  Bladder: Unremarkable.  Reproductive: Unremarkable.  Bowel: There is marked thickening of the appendix wall, with central fluid filling.  Local peritoneal enhancement and lymphadenopathy. Although the changes are extensive, there is no definitive perforation or abscess. Reactive inflammation of the cecum.  Retroperitoneum: Ileocolic adenopathy, reactive.  OSSEOUS: No acute abnormalities.  Critical Value/emergent results were called by telephone at the time of interpretation on 11/13/2013 at 7:24 AM to Dr. Elyn Peers , who verbally acknowledged these results.  IMPRESSION: Acute appendicitis. The inflammation is advanced, but no perforation or abscess.   Electronically Signed   By: Jorje Guild M.D.   On: 11/13/2013 07:27    Anti-infectives: Anti-infectives   Start     Dose/Rate Route Frequency Ordered Stop   11/13/13 1500  Ampicillin-Sulbactam (UNASYN) 3 g in sodium chloride 0.9 % 100 mL IVPB     3 g 100 mL/hr over 60 Minutes Intravenous Every 6 hours 11/13/13 1121 11/14/13 1459   11/13/13 0730  [MAR Hold]  Ampicillin-Sulbactam (UNASYN) 3 g in sodium chloride 0.9 % 100 mL IVPB     (On MAR Hold since 11/13/13 0847)   3 g 100 mL/hr over 60 Minutes Intravenous  Once 11/13/13 0727 11/13/13 0850      Assessment/Plan: s/p Procedure(s): LAP ASSISTED PARTIAL CECECTOMY (N/A) Continue abx Continue bowel rest Will change pca and add toradol OOB  LOS: 1 day    TOTH III,Kunaal Walkins S 11/14/2013

## 2013-11-15 ENCOUNTER — Encounter (HOSPITAL_COMMUNITY): Payer: Self-pay | Admitting: General Surgery

## 2013-11-15 NOTE — Progress Notes (Signed)
I have seen and examined the pt and agree with NP Riebock's progress note. Con't NPO-PO in next 1-2d Con't to mobilize Path pending

## 2013-11-15 NOTE — Progress Notes (Signed)
Patient ID: Linda Odom, female   DOB: Jan 13, 1981, 33 y.o.   MRN: [id number]  Subjective: Frustrated about her care, wasn't wearing C02 monitor, now agreeable.    Objective:  Vital signs:  Filed Vitals:   11/14/13 2110 11/15/13 0000 11/15/13 0300 11/15/13 0500  BP: 116/75   131/80  Pulse: 87   97  Temp: 98.9 F (37.2 C)   98.5 F (36.9 C)  TempSrc: Oral   Oral  Resp: _0 Height:      Weight:      SpO2: 93% 98% 99% 94%       Intake/Output   Yesterday:  03/08 0701 - 03/09 0700 In: 2400 [I.V.:2400] Out: 1585 [Urine:1450; Drains:135] This shift:    I/O last 3 completed shifts: In: 3600 [I.V.:3600] Out: 2065 [Urine:1900; Drains:165]   Physical Exam: General: Pt awake/alert/oriented x3 in no acute distress Chest: cta bilaterally.  No chest wall pain w good excursion CV:  Pulses intact.  Regular rhythm MS: Normal AROM mjr joints.  No obvious deformity Abdomen: Soft.  Nondistended.hypoactive bowel sounds.  Appropriately tender.  No evidence of peritonitis.  No incarcerated hernias. Incisions are c/d/i Ext:  SCDs BLE.  No mjr edema.  No cyanosis Skin: No petechiae / purpura   Problem List:   Active Problems:   Acute appendicitis   Acute appendicitis, unspecified acute appendicitis type    Results:   Labs: Results for orders placed during the hospital encounter of 11/13/13 (from the past 48 hour(s))  BASIC METABOLIC PANEL     Status: Abnormal   Collection Time    11/14/13  5:56 AM      Result Value Ref Range   Sodium 136 (*) 137 - 147 mEq/L   Potassium 3.9  3.7 - 5.3 mEq/L   Chloride 102  96 - 112 mEq/L   CO2 24  19 - 32 mEq/L   Glucose, Bld 114 (*) 70 - 99 mg/dL   BUN 4 (*) 6 - 23 mg/dL   Creatinine, Ser 0.58  0.50 - 1.10 mg/dL   Calcium 8.5  8.4 - 10.5 mg/dL   GFR calc non Af Amer >90  >90 mL/min   GFR calc Af Amer >90  >90 mL/min   Comment: (NOTE)     The eGFR has been calculated using the CKD EPI equation.     This calculation has not been  validated in all clinical situations.     eGFR's persistently <90 mL/min signify possible Chronic Kidney     Disease.  CBC     Status: Abnormal   Collection Time    11/14/13  5:56 AM      Result Value Ref Range   WBC 7.5  4.0 - 10.5 K/uL   RBC 3.93  3.87 - 5.11 MIL/uL   Hemoglobin 10.9 (*) 12.0 - 15.0 g/dL   HCT 33.3 (*) 36.0 - 46.0 %   MCV 84.7  78.0 - 100.0 fL   MCH 27.7  26.0 - 34.0 pg   MCHC 32.7  30.0 - 36.0 g/dL   RDW 13.9  11.5 - 15.5 %   Platelets 200  150 - 400 K/uL    Imaging / Studies: No results found.  Medications / Allergies: per chart  Antibiotics: Anti-infectives   Start     Dose/Rate Route Frequency Ordered Stop   11/13/13 1500  Ampicillin-Sulbactam (UNASYN) 3 g in sodium chloride 0.9 % 100 mL IVPB     3 g 100 mL/hr over  60 Minutes Intravenous Every 6 hours 11/13/13 1121 11/14/13 0931   11/13/13 0730  [MAR Hold]  Ampicillin-Sulbactam (UNASYN) 3 g in sodium chloride 0.9 % 100 mL IVPB     (On MAR Hold since 11/13/13 0847)   3 g 100 mL/hr over 60 Minutes Intravenous  Once 11/13/13 0727 11/13/13 0850      Assessment/Plan Lap assisted parial ileocecectomy  Await peritoneal nodule biopsy results Await bowel function Foley out this AM, if she does not void by 12, obtain a bladder scan and straight cath IVF Continue PCA today and toradol Continue drain Remove dressing  Today SCDs, heparin Mobilize IS  Emina Riebock, ANP-BC [facility] Pager [phone] Office [phone]  11/15/2013 10:02 AM    

## 2013-11-16 LAB — URINALYSIS, ROUTINE W REFLEX MICROSCOPIC
Bilirubin Urine: NEGATIVE
Glucose, UA: NEGATIVE mg/dL
Hgb urine dipstick: NEGATIVE
Ketones, ur: NEGATIVE mg/dL
Leukocytes, UA: NEGATIVE
NITRITE: POSITIVE — AB
Protein, ur: NEGATIVE mg/dL
SPECIFIC GRAVITY, URINE: 1.01 (ref 1.005–1.030)
Urobilinogen, UA: 1 mg/dL (ref 0.0–1.0)
pH: 8 (ref 5.0–8.0)

## 2013-11-16 LAB — URINE MICROSCOPIC-ADD ON

## 2013-11-16 NOTE — Progress Notes (Signed)
Patient ID: Linda Odom, female   DOB: 1980/10/24, 33 y.o.   MRN: [id number]  Subjective: No flatus, or BM.  Voiding.  Ambulating.  Asking for food.  Had a long discussion regarding surgery, return of bowel function and following our recommendations.   Objective:  Vital signs:  Filed Vitals:   11/15/13 2110 11/15/13 2346 11/16/13 0400 11/16/13 0550  BP: 119/76   112/76  Pulse: 78   85  Temp: 98 F (36.7 C)   98.1 F (36.7 C)  TempSrc: Oral   Oral  Resp: 13 13 16 15   Height:      Weight:      SpO2: 100% 100% 99% 100%    Last BM Date: 11/12/13  Intake/Output   Yesterday:  03/09 0701 - 03/10 0700 In: 2378.3 [I.V.:2378.3] Out: 1625 [DGUYQ:0347; Drains:155] This shift:  Physical Exam:  General: Pt awake/alert/oriented x3 in no acute distress  Chest: cta bilaterally. No chest wall pain w good excursion  CV: Pulses intact. Regular rhythm  MS: Normal AROM mjr joints. No obvious deformity  Abdomen: Soft. Nondistended.hypoactive bowel sounds. Appropriately tender. No evidence of peritonitis. No incarcerated hernias. Incisions are c/d/i  Ext: SCDs BLE. No mjr edema. No cyanosis  Skin: No petechiae / purpura    Problem List:   Active Problems:   Acute appendicitis   Acute appendicitis, unspecified acute appendicitis type    Results:   Labs: No results found for this or any previous visit (from the past 75 hour(s)).  Imaging / Studies: No results found.  Medications / Allergies: per chart  Antibiotics: Anti-infectives   Start     Dose/Rate Route Frequency Ordered Stop   11/13/13 1500  Ampicillin-Sulbactam (UNASYN) 3 g in sodium chloride 0.9 % 100 mL IVPB     3 g 100 mL/hr over 60 Minutes Intravenous Every 6 hours 11/13/13 1121 11/14/13 0931   11/13/13 0730  [MAR Hold]  Ampicillin-Sulbactam (UNASYN) 3 g in sodium chloride 0.9 % 100 mL IVPB     (On MAR Hold since 11/13/13 0847)   3 g 100 mL/hr over 60 Minutes Intravenous  Once 11/13/13 0727 11/13/13 0850      Assessment/Plan  Lap assisted parial ileocecectomy---Dr. Zella Richer 11/13/13 POD#3 Await peritoneal nodule biopsy results  Await bowel function, NPO x ice chips IVF  Continue PCA today and toradol  Continue drain  Apply abd pad to lower abdominal dressing SCDs, heparin  Mobilize  IS abd pad for comfort  Erby Pian, [facility] Pager [phone] Office [phone]  11/16/2013 8:35 AM

## 2013-11-16 NOTE — Progress Notes (Signed)
I have seen and examined the pt and agree with NP Riebock's progress note. Await bowel function Path pending Mobilize

## 2013-11-16 NOTE — Progress Notes (Signed)
Patient complained of painful urination. Stated that she has a UTI and she has been telling everyone but no one is listening. Paged NP and obtained orders for a UA. Will continue to monitor.

## 2013-11-16 NOTE — Progress Notes (Signed)
UA results were positive. Notified NP order obtained for a Urine Culture. Culture sent,  will continue to monitor.

## 2013-11-17 MED ORDER — BISACODYL 10 MG RE SUPP
10.0000 mg | Freq: Once | RECTAL | Status: AC
  Administered 2013-11-17: 10 mg via RECTAL
  Filled 2013-11-17: qty 1

## 2013-11-17 MED ORDER — CIPROFLOXACIN IN D5W 400 MG/200ML IV SOLN
400.0000 mg | Freq: Two times a day (BID) | INTRAVENOUS | Status: DC
  Administered 2013-11-17 – 2013-11-18 (×2): 400 mg via INTRAVENOUS
  Filled 2013-11-17 (×4): qty 200

## 2013-11-17 MED ORDER — KETOROLAC TROMETHAMINE 15 MG/ML IJ SOLN
15.0000 mg | Freq: Four times a day (QID) | INTRAMUSCULAR | Status: DC
  Administered 2013-11-17 – 2013-11-18 (×4): 15 mg via INTRAVENOUS
  Filled 2013-11-17 (×8): qty 1

## 2013-11-17 NOTE — Progress Notes (Signed)
Agree Mobilize Await bowel function

## 2013-11-17 NOTE — Progress Notes (Signed)
Patient complaining of increases urinary symptoms. MD paged. RN awaiting return phone call.

## 2013-11-17 NOTE — Progress Notes (Signed)
4 Days Post-Op  Subjective: She says she has had no flatus, sounds like she may have some constipation issues before admit, see's Gi for Ulcer/GERD. Wants to eat, i told her when she reports some gas we can start some liquids.  Objective: Vital signs in last 24 hours: Temp:  [97.7 F (36.5 C)-99.1 F (37.3 C)] 97.7 F (36.5 C) (03/11 0541) Pulse Rate:  [80-89] 82 (03/11 0541) Resp:  [12-20] 18 (03/11 0730) BP: (107-144)/(73-88) 116/79 mmHg (03/11 0541) SpO2:  [98 %-100 %] 100 % (03/11 0730) Last BM Date: 11/13/13 Nothing PO  Drain: none Stool:  None recorded. NPO TM 99 No labs  Intake/Output from previous day: 03/10 0701 - 03/11 0700 In: 2421.7 [I.V.:2421.7] Out: 1010 [Urine:800; Drains:210] Intake/Output this shift: Total I/O In: 40 [Other:40] Out: 300 [Urine:300]  General appearance: alert, cooperative and no distress GI: soft, tender complains of pain, wounds OK, i took the wicks out.  few BS  Lab Results:  No results found for this basename: WBC, HGB, HCT, PLT,  in the last 72 hours  BMET No results found for this basename: NA, K, CL, CO2, GLUCOSE, BUN, CREATININE, CALCIUM,  in the last 72 hours PT/INR No results found for this basename: LABPROT, INR,  in the last 72 hours   Recent Labs Lab 11/13/13 0515  AST 21  ALT 28  ALKPHOS 71  BILITOT 0.3  PROT 7.3  ALBUMIN 3.1*     Lipase     Component Value Date/Time   LIPASE 20 11/13/2013 0515     Studies/Results: No results found.  Medications: . ciprofloxacin  400 mg Intravenous Q12H  . heparin subcutaneous  5,000 Units Subcutaneous 3 times per day  . HYDROmorphone PCA 0.3 mg/mL   Intravenous 6 times per day  . ipratropium  2 spray Each Nare QID  . pantoprazole (PROTONIX) IV  40 mg Intravenous Q24H   . dextrose 5% lactated ringers with KCl 20 mEq/L 100 mL/hr at 11/17/13 0600   Prior to Admission medications   Medication Sig Start Date End Date Taking? Authorizing Provider  ipratropium (ATROVENT)  0.06 % nasal spray Place 2 sprays into both nostrils 4 (four) times daily. 09/08/13  Yes Gregor Hams, MD  omeprazole (PRILOSEC OTC) 20 MG tablet Take 1 tablet (20 mg total) by mouth daily. 08/12/12 11/13/13 Yes Manya Silvas, CNM  ondansetron (ZOFRAN) 4 MG tablet Take 4 mg by mouth every 8 (eight) hours as needed for nausea or vomiting.   Yes Historical Provider, MD     Assessment/Plan Appendicitis with cecal inflammatory mass Laparoscopic-assisted ileocecectomy. Biopsy of peritoneal nodule, 11/13/2013. Odis Hollingshead, MD  . 1. 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 - TWO LYMPH NODES WITH REACTIVE LYMPHOID HYPERPLASIA, NO EVIDENCE OF NEOPLASM (0/2). 2. Peritoneum, biopsy - FIBROTIC TISSUE WITH ASSOCIATED MYOFIBROBLASTIC PROLIFERATION AND ACUTE AND CHRONIC INFLAMMATION. Microscopic Comment The appendiceal wall is markedly thickened with chronic active appendicitis characterized by expansion ofPath:    2.  GERD 3.  Body mass index is 42.42 kg/(m^2). 4.  UTI started on Cipro this AM  Plan:  Add some Toradol, continue to mobilizie recheck labs in AM.    LOS: 4 days    Eriberto Felch 11/17/2013

## 2013-11-18 LAB — BASIC METABOLIC PANEL
BUN: 5 mg/dL — ABNORMAL LOW (ref 6–23)
CO2: 25 mEq/L (ref 19–32)
Calcium: 9 mg/dL (ref 8.4–10.5)
Chloride: 101 mEq/L (ref 96–112)
Creatinine, Ser: 0.65 mg/dL (ref 0.50–1.10)
GFR calc Af Amer: >90 mL/min (ref >90)
Glucose, Bld: 91 mg/dL (ref 70–99)
POTASSIUM: 4.3 meq/L (ref 3.7–5.3)
SODIUM: 138 meq/L (ref 137–147)

## 2013-11-18 LAB — URINE CULTURE
Colony Count: >=100000
Special Requests: NORMAL

## 2013-11-18 LAB — CBC
HCT: 31 % — ABNORMAL LOW (ref 36.0–46.0)
Hemoglobin: 10.1 g/dL — ABNORMAL LOW (ref 12.0–15.0)
MCH: 27.4 pg (ref 26.0–34.0)
MCHC: 32.6 g/dL (ref 30.0–36.0)
MCV: 84 fL (ref 78.0–100.0)
Platelets: 234 10*3/uL (ref 150–400)
RBC: 3.69 MIL/uL — AB (ref 3.87–5.11)
RDW: 13.3 % (ref 11.5–15.5)
WBC: 5.7 10*3/uL (ref 4.0–10.5)

## 2013-11-18 LAB — MAGNESIUM: MAGNESIUM: 1.7 mg/dL (ref 1.5–2.5)

## 2013-11-18 MED ORDER — CIPROFLOXACIN HCL 500 MG PO TABS
500.0000 mg | ORAL_TABLET | Freq: Two times a day (BID) | ORAL | Status: DC
  Administered 2013-11-18 – 2013-11-19 (×2): 500 mg via ORAL
  Filled 2013-11-18 (×5): qty 1

## 2013-11-18 MED ORDER — HYDROMORPHONE HCL PF 1 MG/ML IJ SOLN: 1.0000 mg | INTRAMUSCULAR | Status: DC | PRN

## 2013-11-18 MED ORDER — SIMETHICONE 40 MG/0.6ML PO SUSP
40.0000 mg | Freq: Four times a day (QID) | ORAL | Status: DC | PRN
  Administered 2013-11-18: 40 mg via ORAL
  Filled 2013-11-18 (×2): qty 0.6

## 2013-11-18 MED ORDER — OXYCODONE-ACETAMINOPHEN 5-325 MG PO TABS
1.0000 | ORAL_TABLET | ORAL | Status: DC | PRN
  Administered 2013-11-18 – 2013-11-19 (×4): 2 via ORAL
  Filled 2013-11-18 (×4): qty 2

## 2013-11-18 MED ORDER — IBUPROFEN 600 MG PO TABS
600.0000 mg | ORAL_TABLET | Freq: Four times a day (QID) | ORAL | Status: DC | PRN
  Filled 2013-11-18: qty 1

## 2013-11-18 NOTE — Progress Notes (Signed)
Pt's IV infiltrated and she removed the catheter herself.  Pt refused for another IV to be restarted. Will continue to monitor.

## 2013-11-18 NOTE — Progress Notes (Signed)
I have seen and examined the pt and agree with PA-Jenning's progress note. Clears Mobilize

## 2013-11-18 NOTE — Progress Notes (Signed)
5 Days Post-Op  Subjective: She has had 3 BM's since yesterday.  She is feeling better.  Incisions ok.  Objective: Vital signs in last 24 hours: Temp:  [97.8 F (36.6 C)-99.2 F (37.3 C)] 97.8 F (36.6 C) (03/12 0622) Pulse Rate:  [53-84] 80 (03/12 0622) Resp:  [14-20] 18 (03/12 0622) BP: (122-146)/(81-85) 134/85 mmHg (03/12 0622) SpO2:  [98 %-100 %] 100 % (03/12 0622) Last BM Date: 11/17/13 2 stools recorded yesterday Afebrile, VSS Labs OK Intake/Output from previous day: 03/11 0701 - 03/12 0700 In: 440 [I.V.:400] Out: 957 [Urine:825; Drains:130; Stool:2] Intake/Output this shift:    General appearance: alert, cooperative and no distress GI: soft sore large abdomen, +BS and BM, incisions look fine.  Drain is clear fluid  Lab Results:   Recent Labs  11/18/13 0445  WBC 5.7  HGB 10.1*  HCT 31.0*  PLT 234    BMET  Recent Labs  11/18/13 0445  NA 138  K 4.3  CL 101  CO2 25  GLUCOSE 91  BUN 5*  CREATININE 0.65  CALCIUM 9.0   PT/INR No results found for this basename: LABPROT, INR,  in the last 72 hours   Recent Labs Lab 11/13/13 0515  AST 21  ALT 28  ALKPHOS 71  BILITOT 0.3  PROT 7.3  ALBUMIN 3.1*     Lipase     Component Value Date/Time   LIPASE 20 11/13/2013 0515     Studies/Results: No results found.  Medications: . ciprofloxacin  400 mg Intravenous Q12H  . heparin subcutaneous  5,000 Units Subcutaneous 3 times per day  . HYDROmorphone PCA 0.3 mg/mL   Intravenous 6 times per day  . ipratropium  2 spray Each Nare QID  . ketorolac  15 mg Intravenous 4 times per day  . pantoprazole (PROTONIX) IV  40 mg Intravenous Q24H    Assessment/Plan Appendicitis with cecal inflammatory mass  Laparoscopic-assisted ileocecectomy. Biopsy of peritoneal nodule, 11/13/2013. Odis Hollingshead, MD  . 1. 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  - TWO LYMPH NODES WITH REACTIVE  LYMPHOID HYPERPLASIA, NO EVIDENCE OF NEOPLASM  (0/2).  2. Peritoneum, biopsy  - FIBROTIC TISSUE WITH ASSOCIATED MYOFIBROBLASTIC PROLIFERATION AND ACUTE AND  CHRONIC INFLAMMATION.  Microscopic Comment  The appendiceal wall is markedly thickened with chronic active appendicitis characterized by expansion ofPath:  2. GERD  3. Body mass index is 42.42 kg/(m^2).  4. UTI started on Cipro this DAY 2.   Plan:  Clear liquids, dc PCA, CONTINUE TO MOBILIZE.   LOS: 5 days    Linda Odom 11/18/2013

## 2013-11-19 ENCOUNTER — Encounter: Payer: Self-pay | Admitting: General Surgery

## 2013-11-19 MED ORDER — PANTOPRAZOLE SODIUM 40 MG PO TBEC
40.0000 mg | DELAYED_RELEASE_TABLET | Freq: Every day | ORAL | Status: DC
  Administered 2013-11-19: 40 mg via ORAL
  Filled 2013-11-19: qty 1

## 2013-11-19 MED ORDER — SIMETHICONE 80 MG PO CHEW
80.0000 mg | CHEWABLE_TABLET | Freq: Four times a day (QID) | ORAL | Status: DC | PRN
  Administered 2013-11-19: 80 mg via ORAL
  Filled 2013-11-19: qty 1

## 2013-11-19 MED ORDER — IBUPROFEN 200 MG PO TABS: ORAL_TABLET | ORAL | Status: DC

## 2013-11-19 MED ORDER — CIPROFLOXACIN HCL 500 MG PO TABS: 500.0000 mg | ORAL_TABLET | Freq: Two times a day (BID) | ORAL | Status: DC

## 2013-11-19 MED ORDER — OXYCODONE-ACETAMINOPHEN 5-325 MG PO TABS: 1.0000 | ORAL_TABLET | ORAL | Status: DC | PRN

## 2013-11-19 NOTE — Progress Notes (Signed)
6 Days Post-Op  Subjective: She is feeling better complaining of some nausea after not getting PPI earlier.  Eating a regular diet, +BM.   Objective: Vital signs in last 24 hours: Temp:  [97.8 F (36.6 C)-98.8 F (37.1 C)] 98.8 F (37.1 C) (03/13 0624) Pulse Rate:  [79-87] 79 (03/13 0624) Resp:  [18] 18 (03/13 0624) BP: (104-112)/(74-76) 104/75 mmHg (03/13 0624) SpO2:  [98 %-100 %] 100 % (03/13 0624) Last BM Date: 11/18/13 Intake?  2 BM yesterday and one reported today Regular diet Afebrile, VSS Labs were all normal yesterday Intake/Output from previous day: 03/12 0701 - 03/13 0700 In: 0  Out: 130 [Drains:130] Intake/Output this shift:    General appearance: alert, cooperative and no distress Resp: clear to auscultation bilaterally GI: soft + BS, wounds OK drain, clear.    Lab Results:   Recent Labs  11/18/13 0445  WBC 5.7  HGB 10.1*  HCT 31.0*  PLT 234    BMET  Recent Labs  11/18/13 0445  NA 138  K 4.3  CL 101  CO2 25  GLUCOSE 91  BUN 5*  CREATININE 0.65  CALCIUM 9.0   PT/INR No results found for this basename: LABPROT, INR,  in the last 72 hours   Recent Labs Lab 11/13/13 0515  AST 21  ALT 28  ALKPHOS 71  BILITOT 0.3  PROT 7.3  ALBUMIN 3.1*     Lipase     Component Value Date/Time   LIPASE 20 11/13/2013 0515     Studies/Results: No results found.  Medications: . ciprofloxacin  500 mg Oral BID  . heparin subcutaneous  5,000 Units Subcutaneous 3 times per day  . ipratropium  2 spray Each Nare QID  . pantoprazole (PROTONIX) IV  40 mg Intravenous Q24H   Prior to Admission medications   Medication Sig Start Date End Date Taking? Authorizing Provider  ipratropium (ATROVENT) 0.06 % nasal spray Place 2 sprays into both nostrils 4 (four) times daily. 09/08/13  Yes Gregor Hams, MD  omeprazole (PRILOSEC OTC) 20 MG tablet Take 1 tablet (20 mg total) by mouth daily. 08/12/12 11/13/13 Yes Manya Silvas, CNM  ondansetron (ZOFRAN) 4 MG tablet  Take 4 mg by mouth every 8 (eight) hours as needed for nausea or vomiting.   Yes Historical Provider, MD     Assessment/Plan Appendicitis with cecal inflammatory mass  Laparoscopic-assisted ileocecectomy. Biopsy of peritoneal nodule, 11/13/2013. Odis Hollingshead, MD  . 1. 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  - TWO LYMPH NODES WITH REACTIVE LYMPHOID HYPERPLASIA, NO EVIDENCE OF NEOPLASM  (0/2).  2. Peritoneum, biopsy  - FIBROTIC TISSUE WITH ASSOCIATED MYOFIBROBLASTIC PROLIFERATION AND ACUTE AND  CHRONIC INFLAMMATION.  Microscopic Comment  The appendiceal wall is markedly thickened with chronic active appendicitis characterized by expansion ofPath:  2. GERD  3. Body mass index is 42.42 kg/(m^2).  4. UTI started on Cipro this DAY 2.   Plan:  Drain was removed, wounds oK, I will get her back next week for staple removal, and Dr. Zella Richer in 2 weeks.  2 more days of Cipro.  LOS: 6 days    Nyela Cortinas 11/19/2013

## 2013-11-19 NOTE — Progress Notes (Signed)
11/19/13 0926  Reviewed discharge instructions with patient. Patient verbalized understanding. Copy of discharge given to patient.

## 2013-11-19 NOTE — Discharge Summary (Signed)
Physician Discharge Summary  Patient ID: Linda Odom MRN: [id number] DOB/AGE: 1980/12/18 33 y.o.  Admit date: 11/13/2013 Discharge date: 11/19/2013  Admission Diagnoses:   1.  Appendicitis with cecal inflammatory mass  2. GERD  3. Body mass index is 42.42 kg/(m^2).  4. UTI started on Cipro this AM  Discharge Diagnoses:  1.  Appendicitis with cecal inflammatory mass  2. GERD  3. Body mass index is 42.42 kg/(m^2).  4. UTI started on Cipro this AM    Active Problems:   Acute appendicitis   Acute appendicitis, unspecified acute appendicitis type   PROCEDURES: Laparoscopic-assisted ileocecectomy. Biopsy of peritoneal nodule. 11/13/2013,  Odis Hollingshead, MD  PATHOLOGY: 1. 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  - TWO LYMPH NODES WITH REACTIVE LYMPHOID HYPERPLASIA, NO EVIDENCE OF NEOPLASM  (0/2).  2. Peritoneum, biopsy  - FIBROTIC TISSUE WITH ASSOCIATED MYOFIBROBLASTIC PROLIFERATION AND ACUTE AND  CHRONIC INFLAMMATION.  Microscopic Comment  The appendiceal wall is markedly thickened with chronic active appendicitis characterized by expansion ofPath:    Hospital Course: Chief Complaint: Persistent right lower quadrant abdominal pain  HPI: She had the onset of diffuse abdominal pain 2 days ago. She had a bowel movement but did not get relief. The pain persisted. Then migrated to the right lower quadrant. She presented to the emergency department and a CT scan was performed. Findings were consistent with acute appendicitis without perforation. No fever. He has had some chills. Currently menstruating. She had an upper respiratory infection within the last month. She's also been having abdominal pain off and on for the past month.  Work up showed a normal WBC, but CT was consistent with appendicitis With a reactive inflammation of the cecum.  He attempted to do this laparoscopically, but ultimately  required an open procedure.  The cecum was densely adhered to the abdominal wall.  She tolerated the procedure well, but had a slow post op course with pain and post op ileus issues. Path showed inflammatory changes only.  Her ileus slowly improved and she was slowly, but steadily mobilized.  Her diet was advanced and she was ready for discharge by 11/19/13.  Her wicks were removed on 11/17/13.  Her wound is dry, clean and healing nicely by time of discharge.  Her drain was clear and serous.  It was removed prior to discharge. She will get her staples out next week and follow up with Dr. Zella Richer in 2 weeks.  Condition on D/c:  Improving     Disposition: 01-Home or Self Care   Future Appointments Provider Department Dept Phone   11/24/2013 3:00 PM [facility] Nurse [facility], [place] [phone]       Medication List         ciprofloxacin 500 MG tablet  Commonly known as:  CIPRO  Take 1 tablet (500 mg total) by mouth 2 (two) times daily.     ibuprofen 200 MG tablet  Commonly known as:  ADVIL,MOTRIN  You can take 2-3 every 6 hours as needed for pain.     ipratropium 0.06 % nasal spray  Commonly known as:  ATROVENT  Place 2 sprays into both nostrils 4 (four) times daily.     omeprazole 20 MG tablet  Commonly known as:  PRILOSEC OTC  Take 1 tablet (20 mg total) by mouth daily.     ondansetron 4 MG tablet  Commonly known as:  ZOFRAN  Take 4 mg by  mouth every 8 (eight) hours as needed for nausea or vomiting.     oxyCODONE-acetaminophen 5-325 MG per tablet  Commonly known as:  PERCOCET/ROXICET  Take 1-2 tablets by mouth every 4 (four) hours as needed for moderate pain.           Follow-up Information   Follow up with ROSENBOWER,TODD J, MD In 2 weeks. (For wound re-check)    Specialty:  General Surgery   Contact information:   [address] [place] [address] [phone]       Follow up with CCS,MD, MD On 11/24/2013. (You have an  appointment to remove staples at 3 PM with the nurse.  Be at the office 30 minutes early for check in.)    Specialty:  General Surgery      Signed: Earnstine Regal 11/19/2013, 12:24 PM

## 2013-11-19 NOTE — Discharge Instructions (Signed)
[facility]      [facility] [phone]  OPEN ABDOMINAL SURGERY: POST OP INSTRUCTIONS  Always review your discharge instruction sheet given to you by the facility where your surgery was performed.  IF YOU HAVE DISABILITY OR FAMILY LEAVE FORMS, YOU MUST BRING THEM TO THE OFFICE FOR PROCESSING.  PLEASE DO NOT GIVE THEM TO YOUR DOCTOR.  1. A prescription for pain medication may be given to you upon discharge.  Take your pain medication as prescribed, if needed.  If narcotic pain medicine is not needed, then you may take acetaminophen (Tylenol) or ibuprofen (Advil) as needed. 2. Take your usually prescribed medications unless otherwise directed. 3. If you need a refill on your pain medication, please contact your pharmacy. They will contact our office to request authorization.  Prescriptions will not be filled after 5pm or on week-ends. 4. You should follow a light diet the first few days after arrival home, such as soup and crackers, pudding, etc.unless your doctor has advised otherwise. A high-fiber, low fat diet can be resumed as tolerated.   Be sure to include lots of fluids daily. Most patients will experience some swelling and bruising on the chest and neck area.  Ice packs will help.  Swelling and bruising can take several days to resolve 5. Most patients will experience some swelling and bruising in the area of the incision. Ice pack will help. Swelling and bruising can take several days to resolve..  6. It is common to experience some constipation if taking pain medication after surgery.  Increasing fluid intake and taking a stool softener will usually help or prevent this problem from occurring.  A mild laxative (Milk of Magnesia or Miralax) should be taken according to package directions if there are no bowel movements after 48 hours. 7.  You may have steri-strips (small skin tapes) in place directly over the incision.  These strips should be left on the skin for 7-10 days.  If your  surgeon used skin glue on the incision, you may shower in 24 hours.  The glue will flake off over the next 2-3 weeks.  Any sutures or staples will be removed at the office during your follow-up visit. You may find that a light gauze bandage over your incision may keep your staples from being rubbed or pulled. You may shower and replace the bandage daily. 8. ACTIVITIES:  You may resume regular (light) daily activities beginning the next day--such as daily self-care, walking, climbing stairs--gradually increasing activities as tolerated.  You may have sexual intercourse when it is comfortable.  Refrain from any heavy lifting or straining until approved by your doctor. a. You may drive when you no longer are taking prescription pain medication, you can comfortably wear a seatbelt, and you can safely maneuver your car and apply brakes b. Return to Work: ___________________________________ 9. You should see your doctor in the office for a follow-up appointment approximately two weeks after your surgery.  Make sure that you call for this appointment within a day or two after you arrive home to insure a convenient appointment time. OTHER INSTRUCTIONS:  _____________________________________________________________ _____________________________________________________________  WHEN TO CALL YOUR DOCTOR: 1. Fever over 101.0 2. Inability to urinate 3. Nausea and/or vomiting 4. Extreme swelling or bruising 5. Continued bleeding from incision. 6. Increased pain, redness, or drainage from the incision. 7. Difficulty swallowing or breathing 8. Muscle cramping or spasms. 9. Numbness or tingling in hands or feet or around lips.  The clinic staff is available to   answer your questions during regular business hours.  Please don't hesitate to call and ask to speak to one of the nurses if you have concerns.  For further questions, please visit [email or web address]   

## 2013-11-19 NOTE — Progress Notes (Signed)
Pt refusing to have IV restarted. Pt notified that she still needs to obtain IV antibiotics in order to treat the UTI.Pt still refusing to have IV started and request to have medications po. Dr. Lucia Gaskins called and notified of pt's request. Orders obtained.

## 2013-11-19 NOTE — Progress Notes (Signed)
I have seen and examined the pt and agree with PA-Jenning's progress note. Home today

## 2013-11-22 ENCOUNTER — Telehealth (INDEPENDENT_AMBULATORY_CARE_PROVIDER_SITE_OTHER): Payer: Self-pay | Admitting: General Surgery

## 2013-11-22 NOTE — Telephone Encounter (Signed)
Pt called for advice on pain medication.  She does not like how the narcotic makes her feel: dizzy and disoriented.  Stated she wanted a Rx for ibuprofen instead.  Explained that ibuprofen is available OTC, but gave her instructions to take either 600 mg Q6H or 800 mg  Q8H.  She wrote this down and was able to read it back with accuracy of understanding.  Will consider using the narcotic at night to help her sleep if needed.  She also asked if she needs to take a test to determine if her UTI is cleared up.  She has finished her Cipro and has no further symptoms of urinary infection.  Reassured her that she doesn't need any further medication or testing, but encouraged her to keep pushing po fluids AMAP.  Pt states understanding of all.

## 2013-11-24 ENCOUNTER — Ambulatory Visit (INDEPENDENT_AMBULATORY_CARE_PROVIDER_SITE_OTHER): Payer: Medicaid Other | Admitting: General Surgery

## 2013-11-24 ENCOUNTER — Encounter (INDEPENDENT_AMBULATORY_CARE_PROVIDER_SITE_OTHER): Payer: Self-pay | Admitting: General Surgery

## 2013-11-24 VITALS — BP 118/70 | HR 67 | Temp 98.6°F | Resp 15 | Ht 65.0 in | Wt 188.0 lb

## 2013-11-24 DIAGNOSIS — Z4802 Encounter for removal of sutures: Secondary | ICD-10-CM

## 2013-11-24 NOTE — Patient Instructions (Signed)
Patient came in for staple removal and I placed steri streps and told the patient that the steri streps can stay on for a week and I told her to eat more protein and rest when she can

## 2013-11-30 ENCOUNTER — Encounter (INDEPENDENT_AMBULATORY_CARE_PROVIDER_SITE_OTHER): Payer: Self-pay

## 2013-11-30 ENCOUNTER — Telehealth (INDEPENDENT_AMBULATORY_CARE_PROVIDER_SITE_OTHER): Payer: Self-pay

## 2013-11-30 ENCOUNTER — Encounter (INDEPENDENT_AMBULATORY_CARE_PROVIDER_SITE_OTHER): Payer: Medicaid Other | Admitting: General Surgery

## 2013-11-30 NOTE — Telephone Encounter (Signed)
Pt called in stating she spoke to someone in triage yesterday about having drainage at incision wound. She was told to keep guaze over area and that it was just some serous fluid draining. She was told to call today for appt if no better. There is no documentation from her conversation yesterday. Today pt states drainage has increased and now red and painful. Wants MD to evaluate wound. Urgent office appt made.

## 2013-12-01 ENCOUNTER — Inpatient Hospital Stay: Admission: RE | Admit: 2013-12-01 | Payer: Self-pay | Source: Ambulatory Visit

## 2013-12-03 ENCOUNTER — Ambulatory Visit
Admission: RE | Admit: 2013-12-03 | Discharge: 2013-12-03 | Disposition: A | Discharge status: Home / Self Care | Payer: Medicaid Other | Source: Ambulatory Visit | Attending: Obstetrics | Admitting: Obstetrics

## 2013-12-03 ENCOUNTER — Ambulatory Visit (INDEPENDENT_AMBULATORY_CARE_PROVIDER_SITE_OTHER): Payer: Medicaid Other

## 2013-12-03 DIAGNOSIS — N6320 Unspecified lump in the left breast, unspecified quadrant: Secondary | ICD-10-CM

## 2013-12-03 NOTE — Progress Notes (Unsigned)
Patient in for wound check, incision has opened. Afebrile., no odor noted . Dr. Johnny Bridge assessed and ordered for area to cleaned with hydrogen peroxide ,apply triple antibiotic and cover with non stick dressing  , I followed orders as indicated ; Advised patient to call if temp 100.3 or greater ,odor ,redness incision site . Patient verbalized understanding

## 2013-12-16 ENCOUNTER — Telehealth (INDEPENDENT_AMBULATORY_CARE_PROVIDER_SITE_OTHER): Payer: Self-pay

## 2013-12-16 NOTE — Telephone Encounter (Signed)
LMOM> Dr Burman Blacksmith refill Script up front for pick up. She will need to bring ID to pick it up. Post op app made for 4/16 at 11:50

## 2013-12-16 NOTE — Telephone Encounter (Signed)
duplicate

## 2013-12-16 NOTE — Telephone Encounter (Signed)
Percocet 1 p.o. Every 6 hours as needed for pain.  Disp #30, no refill.

## 2013-12-16 NOTE — Telephone Encounter (Signed)
Patient called in for refill of percocet. Pt is s/p partial cecectomy on 3/7. Pt has not been seen in follow up after surgery except by nurse only visits. No refills on any narcotics since discharge. Requests percocet refill and not the norco protocol. Advised we will send request to Dr Zella Richer and call her back. A post op appt will be given to pt once we call her back.

## 2013-12-23 ENCOUNTER — Encounter (INDEPENDENT_AMBULATORY_CARE_PROVIDER_SITE_OTHER): Payer: Self-pay | Admitting: General Surgery

## 2013-12-23 ENCOUNTER — Ambulatory Visit (INDEPENDENT_AMBULATORY_CARE_PROVIDER_SITE_OTHER): Payer: Medicaid Other | Admitting: General Surgery

## 2013-12-23 VITALS — BP 125/75 | HR 83 | Temp 97.9°F | Resp 14 | Ht 62.0 in | Wt 247.8 lb

## 2013-12-23 DIAGNOSIS — Z4889 Encounter for other specified surgical aftercare: Secondary | ICD-10-CM

## 2013-12-23 NOTE — Patient Instructions (Signed)
Continue light activities (no lifting a, pushing, or pulling over 10 to 20 pounds)

## 2013-12-23 NOTE — Progress Notes (Signed)
Procedure:  Laparoscopic converted to open ileocecectomy for complicated appendicitis  Date:  11/13/2013  Pathology:  Chronic active appendicitis with lymphoid hyperplasia  History:  She is here for her first postoperative visit and is doing well. Bowels are moving. Soreness is decreasing.  Exam: General- Is in NAD. Abdomen-incisions are clean, intact, and solid.  Assessment:  She is progressing well postoperatively. Pathology was explained to her.  Plan:  Continue light activities. Return visit 3 weeks.

## 2014-01-13 ENCOUNTER — Encounter (INDEPENDENT_AMBULATORY_CARE_PROVIDER_SITE_OTHER): Payer: Medicaid Other | Admitting: General Surgery

## 2014-01-18 ENCOUNTER — Telehealth (INDEPENDENT_AMBULATORY_CARE_PROVIDER_SITE_OTHER): Payer: Self-pay

## 2014-01-18 NOTE — Telephone Encounter (Signed)
Pt calling in requesting a refill on Oxycodone 5/325mg . The pt is a s/p lap assisted partial cecectomy done on 11/13/13. The last refill was on 12/16/13 for #30. I advised that we would have to check with Dr Zella Richer then call her back with an answer. The pt understands.

## 2014-01-18 NOTE — Telephone Encounter (Signed)
Called pt to notify her that Dr Zella Richer is not going to refill the pain medication. The pt is aware.

## 2014-01-18 NOTE — Telephone Encounter (Signed)
No refill

## 2014-02-01 ENCOUNTER — Encounter (INDEPENDENT_AMBULATORY_CARE_PROVIDER_SITE_OTHER): Payer: Medicaid Other | Admitting: General Surgery

## 2014-02-01 NOTE — Telephone Encounter (Signed)
Pt called again to ask for more pain medication.  Explained that Dr. Zella Richer had already stated no more narcotics.  She states she had a major surgery and was still healing from it and had pain.  Reassured her that while she indeed had a major surgery, it was over two months ago, and she should be able to use OTC pain medication by now.  She can choose from Tylenol, Motrin or Aleve, and follow the label instructions for taking it.  She was not pleased with this answer, but understands.

## 2014-02-09 ENCOUNTER — Encounter (INDEPENDENT_AMBULATORY_CARE_PROVIDER_SITE_OTHER): Payer: Medicaid Other | Admitting: General Surgery

## 2014-06-14 LAB — PROCEDURE REPORT - SCANNED: Pap: NEGATIVE

## 2014-07-11 ENCOUNTER — Encounter (INDEPENDENT_AMBULATORY_CARE_PROVIDER_SITE_OTHER): Payer: Self-pay | Admitting: General Surgery

## 2014-11-05 ENCOUNTER — Emergency Department (HOSPITAL_COMMUNITY)
Admission: EM | Admit: 2014-11-05 | Discharge: 2014-11-05 | Disposition: A | Discharge status: Home / Self Care | Payer: Medicaid Other | Source: Home / Self Care | Attending: Emergency Medicine | Admitting: Emergency Medicine

## 2014-11-05 ENCOUNTER — Encounter (HOSPITAL_COMMUNITY): Payer: Self-pay | Admitting: *Deleted

## 2014-11-05 DIAGNOSIS — K0401 Reversible pulpitis: Secondary | ICD-10-CM

## 2014-11-05 DIAGNOSIS — K040 Pulpitis: Secondary | ICD-10-CM

## 2014-11-05 MED ORDER — HYDROCODONE-ACETAMINOPHEN 5-325 MG PO TABS
2.0000 | ORAL_TABLET | Freq: Once | ORAL | Status: AC
  Administered 2014-11-05: 2 via ORAL

## 2014-11-05 MED ORDER — HYDROCODONE-ACETAMINOPHEN 5-325 MG PO TABS
ORAL_TABLET | ORAL | Status: AC
  Filled 2014-11-05: qty 1

## 2014-11-05 MED ORDER — NAPROXEN 500 MG PO TABS: 500.0000 mg | ORAL_TABLET | Freq: Two times a day (BID) | ORAL | Status: DC

## 2014-11-05 MED ORDER — CLINDAMYCIN HCL 300 MG PO CAPS: 300.0000 mg | ORAL_CAPSULE | Freq: Four times a day (QID) | ORAL | Status: DC

## 2014-11-05 MED ORDER — HYDROCODONE-ACETAMINOPHEN 5-325 MG PO TABS
ORAL_TABLET | ORAL | Status: DC
Start: 2014-11-05 — End: 2014-12-17

## 2014-11-05 NOTE — ED Notes (Signed)
Toothache onset 1 week in R upper jaw.  Taking Advil PM with partial relief but lasts 7 hrs.

## 2014-11-05 NOTE — ED Provider Notes (Signed)
   Chief Complaint   Dental Pain   History of Present Illness   Linda Odom is a 34 year old female who has had a one-week history of pain in her right, upper, first molar. She states a filling came out this tooth. There is been swollen swelling of the gingiva and the cheek. She's had some headache but no fevers or chills. She has no trouble swallowing or breathing. It does hurt to chew on that side. No pain or swelling in the neck, chest pain, or shortness of breath.  Review of Systems   Other than as noted above, the patient denies any of the following symptoms: Systemic:  No fever or chills. ENT:  No headache, ear ache, sore throat, nasal congestion, facial pain, or swelling. Neck:  No adenopathy or neck swelling. Lungs:  No coughing or shortness of breath.  [place]   Past medical history, family history, social history, meds, and allergies were reviewed.   Physical Examination     Vital signs:  BP 104/74 mmHg  Pulse 76  Temp(Src) 98.2 F (36.8 C) (Oral)  Resp 16  SpO2 100%  LMP 11/02/2014 General:  Alert, oriented, in no distress. ENT:  TMs and canals normal.  Nasal mucosa normal. Mouth exam:  She has several missing teeth, but the teeth that are there appear to be in good repair. On her right, upper, first molar there is a small cavity with a filling that has come out. There is no swelling of the gingiva, no collection of pus. Pharynx is clear, no swelling of the floor the mouth. Neck:  No swelling or adenopathy. Lungs:  Breath sounds clear and equal bilaterally.  No wheezes, rales or rhonchi. Heart:  Regular rhythm.  No gallops or murmers. Skin:  Clear, warm and dry.    Course in Urgent [facility]   The following medications were given:  Medications  HYDROcodone-acetaminophen (NORCO/VICODIN) 5-325 MG per tablet 2 tablet (2 tablets Oral Given 11/05/14 1118)   Assessment   The encounter diagnosis was Pulpitis.  No evidence of Ludwig's angina.    Plan   1.   Meds:  The following meds were prescribed:   Discharge Medication List as of 11/05/2014 11:08 AM    START taking these medications   Details  HYDROcodone-acetaminophen (NORCO/VICODIN) 5-325 MG per tablet 1 to 2 tabs every 4 to 6 hours as needed for pain., Print       Also given prescriptions for clonidine 300 mg #40 one 4 times a day and naproxen 500 mg #20 one twice a day.  2.  Patient Education/Counseling:  The patient was given appropriate handouts, self care instructions, and instructed in pain control. Suggested sleeping with head of bed elevated and hot salt water mouthwash.   3.  Follow up:  The patient was told to follow up if no better in 3 to 4 days, if becoming worse in any way, and given some red flag symptoms such as difficulty swallowing or breathing which would prompt immediate return.  Follow up with a dentist as soon as posssible.     Harden Mo, MD 11/05/14 [phone]

## 2014-11-05 NOTE — Discharge Instructions (Signed)
Look up the [place] [facility] for free dental clinics. undoomedical.com.asp  Get there early and be prepared to wait. [employer] and [employer] have [employer]writer, advertising schools that provide low cost routine dental care.   Other resources: [facility] [place], [place] [phone]  Patients with Medicaid: [place] [address] [place] [employer] Phone:  [phone]                                                  Phone:  [phone]  Dr. Ardyth Harps [address]. [phone]  If unable to pay or uninsured, contact:  [place] or [facility]. to become qualified for the adult dental clinic.  No matter what dental problem you have, it will not get better unless you get good dental care.  If the tooth is not taken care of, your symptoms will come back in time and you will be visiting [place] again in the Urgent [place] with a bad toothache.  So, see your dentist as soon as possible.  If you don't have a dentist, we can give you a list of dentists.  Sometimes the most cost effective treatment is removal of the tooth.  This can be done very inexpensively through one of the low cost Dance movement psychotherapist such as the facility on [facility] in [place] [phone]).  The downside to this is that you will have one less tooth and this can effect your ability to chew.  Some other things that can be done for a dental infection include the following:   Rinse your mouth out with hot salt water (1/2 tsp of table salt and a pinch of baking soda in 8 oz of hot water).  You can do this every 2 or 3 hours.  Avoid cold foods, beverages, and cold air.  This will make your symptoms worse.  Sleep with your head elevated.  Sleeping flat will cause your gums and oral tissues to swell and make them hurt  more.  You can sleep on several pillows.  Even better is to sleep in a recliner with your head higher than your heart.  For mild to moderate pain, you can take Tylenol, ibuprofen, or Aleve.  External application of heat by a heating pad, hot water bottle, or hot wet towel can help with pain and speed healing.  You can do this every 2 to 3 hours. Do not fall asleep on a heating pad since this can cause a burn.   Go to www.goodrx.com to look up your medications. This will give you a list of where you can find your prescriptions at the most affordable prices.   RESOURCE GUIDE  Dental Problems  Look up SuperiorMarketers.be.asp for a schedule of the [employer]'s free dental clinics called [place]. They have clinics all around [place]. Get there early and be prepared to wait.   [facility] [address]  [place], [place]  [address] [phone]  [facility] [place], [place] [phone]  Patients with Medicaid: [place] [address] [place] [employer] Phone:  [phone]                                                  Phone:  [phone]  If unable to pay or uninsured, contact:  [facility] or [facility]. to become qualified for the adult dental clinic.

## 2014-12-17 ENCOUNTER — Emergency Department (HOSPITAL_COMMUNITY)
Admission: EM | Admit: 2014-12-17 | Discharge: 2014-12-17 | Disposition: A | Discharge status: Home / Self Care | Payer: Medicaid Other | Attending: Emergency Medicine | Admitting: Emergency Medicine

## 2014-12-17 ENCOUNTER — Encounter (HOSPITAL_COMMUNITY): Payer: Self-pay | Admitting: *Deleted

## 2014-12-17 DIAGNOSIS — Z792 Long term (current) use of antibiotics: Secondary | ICD-10-CM | POA: Insufficient documentation

## 2014-12-17 DIAGNOSIS — Z720 Tobacco use: Secondary | ICD-10-CM | POA: Diagnosis not present

## 2014-12-17 DIAGNOSIS — R1084 Generalized abdominal pain: Secondary | ICD-10-CM | POA: Insufficient documentation

## 2014-12-17 DIAGNOSIS — R197 Diarrhea, unspecified: Secondary | ICD-10-CM | POA: Diagnosis not present

## 2014-12-17 DIAGNOSIS — Z9049 Acquired absence of other specified parts of digestive tract: Secondary | ICD-10-CM | POA: Diagnosis not present

## 2014-12-17 DIAGNOSIS — Z8742 Personal history of other diseases of the female genital tract: Secondary | ICD-10-CM | POA: Diagnosis not present

## 2014-12-17 DIAGNOSIS — R1110 Vomiting, unspecified: Secondary | ICD-10-CM | POA: Diagnosis present

## 2014-12-17 DIAGNOSIS — Z8719 Personal history of other diseases of the digestive system: Secondary | ICD-10-CM | POA: Insufficient documentation

## 2014-12-17 DIAGNOSIS — Z9851 Tubal ligation status: Secondary | ICD-10-CM | POA: Insufficient documentation

## 2014-12-17 DIAGNOSIS — R112 Nausea with vomiting, unspecified: Secondary | ICD-10-CM | POA: Insufficient documentation

## 2014-12-17 DIAGNOSIS — Z79899 Other long term (current) drug therapy: Secondary | ICD-10-CM | POA: Diagnosis not present

## 2014-12-17 DIAGNOSIS — Z791 Long term (current) use of non-steroidal anti-inflammatories (NSAID): Secondary | ICD-10-CM | POA: Diagnosis not present

## 2014-12-17 DIAGNOSIS — Z3202 Encounter for pregnancy test, result negative: Secondary | ICD-10-CM | POA: Diagnosis not present

## 2014-12-17 DIAGNOSIS — R109 Unspecified abdominal pain: Secondary | ICD-10-CM

## 2014-12-17 LAB — URINALYSIS, ROUTINE W REFLEX MICROSCOPIC
Glucose, UA: NEGATIVE mg/dL
Hgb urine dipstick: NEGATIVE
Ketones, ur: 15 mg/dL — AB
Leukocytes, UA: NEGATIVE
Nitrite: NEGATIVE
Protein, ur: 30 mg/dL — AB
Specific Gravity, Urine: 1.031 — ABNORMAL HIGH (ref 1.005–1.030)
Urobilinogen, UA: 0.2 mg/dL (ref 0.0–1.0)
pH: 5.5 (ref 5.0–8.0)

## 2014-12-17 LAB — COMPREHENSIVE METABOLIC PANEL
ALBUMIN: 4.5 g/dL (ref 3.5–5.2)
ALT: 17 U/L (ref 0–35)
ANION GAP: 9 (ref 5–15)
AST: 18 U/L (ref 0–37)
Alkaline Phosphatase: 78 U/L (ref 39–117)
BILIRUBIN TOTAL: 0.6 mg/dL (ref 0.3–1.2)
BUN: 14 mg/dL (ref 6–23)
CHLORIDE: 107 mmol/L (ref 96–112)
CO2: 20 mmol/L (ref 19–32)
Calcium: 9.7 mg/dL (ref 8.4–10.5)
Creatinine, Ser: 0.65 mg/dL (ref 0.50–1.10)
GFR calc non Af Amer: >90 mL/min (ref >90)
Glucose, Bld: 125 mg/dL — ABNORMAL HIGH (ref 70–99)
Potassium: 3.9 mmol/L (ref 3.5–5.1)
Sodium: 136 mmol/L (ref 135–145)
TOTAL PROTEIN: 9.5 g/dL — AB (ref 6.0–8.3)

## 2014-12-17 LAB — LIPASE, BLOOD: Lipase: 21 U/L (ref 11–59)

## 2014-12-17 LAB — CBC WITH DIFFERENTIAL/PLATELET
Basophils Absolute: 0 10*3/uL (ref 0.0–0.1)
Basophils Relative: 0 % (ref 0–1)
Eosinophils Absolute: 0 10*3/uL (ref 0.0–0.7)
Eosinophils Relative: 0 % (ref 0–5)
HCT: 44.5 % (ref 36.0–46.0)
HEMOGLOBIN: 14.5 g/dL (ref 12.0–15.0)
LYMPHS PCT: 11 % — AB (ref 12–46)
Lymphs Abs: 1.3 10*3/uL (ref 0.7–4.0)
MCH: 27.8 pg (ref 26.0–34.0)
MCHC: 32.6 g/dL (ref 30.0–36.0)
MCV: 85.2 fL (ref 78.0–100.0)
Monocytes Absolute: 0.4 10*3/uL (ref 0.1–1.0)
Monocytes Relative: 4 % (ref 3–12)
NEUTROS ABS: 10.2 10*3/uL — AB (ref 1.7–7.7)
NEUTROS PCT: 85 % — AB (ref 43–77)
Platelets: 295 10*3/uL (ref 150–400)
RBC: 5.22 MIL/uL — ABNORMAL HIGH (ref 3.87–5.11)
RDW: 14.5 % (ref 11.5–15.5)
WBC: 12 10*3/uL — AB (ref 4.0–10.5)

## 2014-12-17 LAB — WET PREP, GENITAL
Clue Cells Wet Prep HPF POC: NONE SEEN
TRICH WET PREP: NONE SEEN
YEAST WET PREP: NONE SEEN

## 2014-12-17 LAB — PREGNANCY, URINE: PREG TEST UR: NEGATIVE

## 2014-12-17 LAB — URINE MICROSCOPIC-ADD ON

## 2014-12-17 MED ORDER — ONDANSETRON HCL 4 MG/2ML IJ SOLN
4.0000 mg | Freq: Once | INTRAMUSCULAR | Status: AC
  Administered 2014-12-17: 4 mg via INTRAVENOUS
  Filled 2014-12-17: qty 2

## 2014-12-17 MED ORDER — FENTANYL CITRATE 0.05 MG/ML IJ SOLN
50.0000 ug | Freq: Once | INTRAMUSCULAR | Status: AC
  Administered 2014-12-17: 50 ug via INTRAVENOUS
  Filled 2014-12-17: qty 2

## 2014-12-17 MED ORDER — DICYCLOMINE HCL 10 MG/ML IM SOLN
20.0000 mg | Freq: Once | INTRAMUSCULAR | Status: AC
  Administered 2014-12-17: 20 mg via INTRAMUSCULAR
  Filled 2014-12-17: qty 2

## 2014-12-17 MED ORDER — ONDANSETRON 8 MG PO TBDP: 8.0000 mg | ORAL_TABLET | Freq: Three times a day (TID) | ORAL | Status: DC | PRN

## 2014-12-17 MED ORDER — DICYCLOMINE HCL 20 MG PO TABS: 20.0000 mg | ORAL_TABLET | Freq: Four times a day (QID) | ORAL | Status: DC | PRN

## 2014-12-17 NOTE — ED Provider Notes (Addendum)
CSN: [id number]     Arrival date & time 12/17/14  0518 History   First MD Initiated Contact with Patient 12/17/14 [phone]     Chief Complaint  Patient presents with  . Emesis     (Consider location/radiation/quality/duration/timing/severity/associated sxs/prior Treatment) HPI 34 year old female presents to the emergency department with complaint of acute onset of nausea, vomiting, diffuse abdominal pain and diarrhea at around midnight.  She reports yesterday she did not feel well after eating lunch with some bilateral back crampy pain.  Patient reports that since before her appendectomy.  She has had loud stomach growling, which she was told we will get better after she had her appendix out.  Patient had fried fish for lunch and spaghetti for dinner with cheese puffs.  She denies any known sick contacts, no fever or chills.  No dysuria.  LMP is March 30.  Patient has crampy upper abdominal pain at this time Past Medical History  Diagnosis Date  . Breast nodule   . No pertinent past medical history   . Gastric ulcer    Past Surgical History  Procedure Laterality Date  . Cholecystectomy    . Tubal ligation  2006  . Laparoscopic appendectomy N/A 11/13/2013    Procedure: LAP ASSISTED PARTIAL CECECTOMY;  Surgeon: Odis Hollingshead, MD;  Location: [facility];  Service: General;  Laterality: N/A;  . Leg sugery  1994   Family History  Problem Relation Age of Onset  . Hypertension Mother   . Diabetes Mother   . Hypertension Maternal Grandmother   . Other Neg Hx    History  Substance Use Topics  . Smoking status: Current Some Day Smoker -- 17.00 packs/day  . Smokeless tobacco: Never Used  . Alcohol Use: Yes     Comment: occ   OB History    Gravida Para Term Preterm AB TAB SAB Ectopic Multiple Living   5 3 3  2  2   3      Review of Systems   See History of Present Illness; otherwise all other systems are reviewed and negative  Allergies  Review of patient's allergies indicates no known  allergies.  Home Medications   Prior to Admission medications   Medication Sig Start Date End Date Taking? Authorizing Provider  traMADol (ULTRAM) 50 MG tablet Take by mouth every 6 (six) hours as needed.   Yes Historical Provider, MD  ciprofloxacin (CIPRO) 500 MG tablet Take 1 tablet (500 mg total) by mouth 2 (two) times daily. 11/19/13   Earnstine Regal, PA-C  clindamycin (CLEOCIN) 300 MG capsule Take 1 capsule (300 mg total) by mouth 4 (four) times daily. 11/05/14   Harden Mo, MD  HYDROcodone-acetaminophen (NORCO/VICODIN) 5-325 MG per tablet 1 to 2 tabs every 4 to 6 hours as needed for pain. 11/05/14   Harden Mo, MD  ibuprofen (ADVIL,MOTRIN) 200 MG tablet You can take 2-3 every 6 hours as needed for pain. 11/19/13   Earnstine Regal, PA-C  ipratropium (ATROVENT) 0.06 % nasal spray Place 2 sprays into both nostrils 4 (four) times daily. 09/08/13   Gregor Hams, MD  naproxen (NAPROSYN) 500 MG tablet Take 1 tablet (500 mg total) by mouth 2 (two) times daily. 11/05/14   Harden Mo, MD  omeprazole (PRILOSEC OTC) 20 MG tablet Take 1 tablet (20 mg total) by mouth daily. 08/12/12 11/05/14  Manya Silvas, CNM  ondansetron (ZOFRAN) 4 MG tablet Take 4 mg by mouth every 8 (eight) hours as needed for nausea or  vomiting.    Historical Provider, MD  oxyCODONE-acetaminophen (PERCOCET/ROXICET) 5-325 MG per tablet Take 1-2 tablets by mouth every 4 (four) hours as needed for moderate pain. 11/19/13   Earnstine Regal, PA-C   BP 118/88 mmHg  Pulse 93  Temp(Src) 97.8 F (36.6 C) (Oral)  Resp 18  Ht 5\' 2"  (1.575 m)  Wt 245 lb (111.131 kg)  BMI 44.80 kg/m2  SpO2 98%  LMP 12/07/2014 Physical Exam  Constitutional: She is oriented to person, place, and time. She appears well-developed and well-nourished.  Obese female, no acute distress.  HENT:  Head: Normocephalic and atraumatic.  Nose: Nose normal.  Mouth/Throat: Oropharynx is clear and moist.  Eyes: Conjunctivae and EOM are normal. Pupils are  equal, round, and reactive to light.  Neck: Normal range of motion. Neck supple. No JVD present. No tracheal deviation present. No thyromegaly present.  Cardiovascular: Normal rate, regular rhythm, normal heart sounds and intact distal pulses.  Exam reveals no gallop and no friction rub.   No murmur heard. Pulmonary/Chest: Effort normal and breath sounds normal. No stridor. No respiratory distress. She has no wheezes. She has no rales. She exhibits no tenderness.  Abdominal: Soft. Bowel sounds are normal. She exhibits no distension and no mass. There is no tenderness. There is no rebound and no guarding.  Genitourinary:  External genitalia normal Vagina scant white discharge Cervix  normal negative for cervical motion tenderness Adnexa palpated, no masses or negative for tenderness noted Bladder palpated positive for tenderness Uterus palpated no masses or negative for tenderness    Musculoskeletal: Normal range of motion. She exhibits no edema or tenderness.  Lymphadenopathy:    She has no cervical adenopathy.  Neurological: She is alert and oriented to person, place, and time. She displays normal reflexes. She exhibits normal muscle tone. Coordination normal.  Skin: Skin is warm and dry. No rash noted. No erythema. No pallor.  Psychiatric: She has a normal mood and affect. Her behavior is normal. Judgment and thought content normal.  Nursing note and vitals reviewed.   ED Course  Procedures (including critical care time) Labs Review Labs Reviewed  WET PREP, GENITAL - Abnormal; Notable for the following:    WBC, Wet Prep HPF POC RARE (*)    All other components within normal limits  URINALYSIS, ROUTINE W REFLEX MICROSCOPIC - Abnormal; Notable for the following:    APPearance CLOUDY (*)    Specific Gravity, Urine 1.031 (*)    Bilirubin Urine SMALL (*)    Ketones, ur 15 (*)    Protein, ur 30 (*)    All other components within normal limits  COMPREHENSIVE METABOLIC PANEL -  Abnormal; Notable for the following:    Glucose, Bld 125 (*)    Total Protein 9.5 (*)    All other components within normal limits  CBC WITH DIFFERENTIAL/PLATELET - Abnormal; Notable for the following:    WBC 12.0 (*)    RBC 5.22 (*)    Neutrophils Relative % 85 (*)    Neutro Abs 10.2 (*)    Lymphocytes Relative 11 (*)    All other components within normal limits  URINE MICROSCOPIC-ADD ON - Abnormal; Notable for the following:    Squamous Epithelial / LPF FEW (*)    Bacteria, UA MANY (*)    All other components within normal limits  PREGNANCY, URINE  LIPASE, BLOOD    Imaging Review No results found.   EKG Interpretation None      MDM   Final diagnoses:  Nausea vomiting and diarrhea  Abdominal cramping    34 year old female with abdominal pain, nausea, vomiting and diarrhea.  Differential includes gastroenteritis, diverticulitis, urinary tract infection with early pyelonephritis.  Patient status post appendectomy.  Pelvic exam without tenderness to adnexa or uterus.  She is tender over the bladder.  Patient has mild diffuse abdominal pain to palpation.  Plan for IV hydration, pain and nausea medication.  Lab work.  Abdomen is not rigid, pain is fairly mild on palpation.  I do not feel strongly this time, patient requires CT scan unless pain is not improving with medications and/or lab work indicates need for scan.    Linton Flemings, MD 12/17/14 0258  Linton Flemings, MD 12/17/14 [phone]

## 2014-12-17 NOTE — ED Notes (Signed)
Patient is alert and oriented x3.  She is complaining of left sided flank and abdominal pain  That started yesterday and progressed to vomiting this morning at 12am.  Patient states  That she has had multiple episodes of vomiting.  Currently she rates her pain 10 of 10.

## 2014-12-17 NOTE — Discharge Instructions (Signed)
Abdominal Pain Many things can cause abdominal pain. Usually, abdominal pain is not caused by a disease and will improve without treatment. It can often be observed and treated at home. Your health care provider will do a physical exam and possibly order blood tests and X-rays to help determine the seriousness of your pain. However, in many cases, more time must pass before a clear cause of the pain can be found. Before that point, your health care provider may not know if you need more testing or further treatment. HOME CARE INSTRUCTIONS  Monitor your abdominal pain for any changes. The following actions may help to alleviate any discomfort you are experiencing:  Only take over-the-counter or prescription medicines as directed by your health care provider.  Do not take laxatives unless directed to do so by your health care provider.  Try a clear liquid diet (broth, tea, or water) as directed by your health care provider. Slowly move to a bland diet as tolerated. SEEK MEDICAL CARE IF:  You have unexplained abdominal pain.  You have abdominal pain associated with nausea or diarrhea.  You have pain when you urinate or have a bowel movement.  You experience abdominal pain that wakes you in the night.  You have abdominal pain that is worsened or improved by eating food.  You have abdominal pain that is worsened with eating fatty foods.  You have a fever. SEEK IMMEDIATE MEDICAL CARE IF:   Your pain does not go away within 2 hours.  You keep throwing up (vomiting).  Your pain is felt only in portions of the abdomen, such as the right side or the left lower portion of the abdomen.  You pass bloody or black tarry stools. MAKE SURE YOU:  Understand these instructions.   Will watch your condition.   Will get help right away if you are not doing well or get worse.  Document Released: 06/05/2005 Document Revised: 08/31/2013 Document Reviewed: 05/05/2013 [facility] Patient Information  2015 [place], [place]. This information is not intended to replace advice given to you by your health care provider. Make sure you discuss any questions you have with your health care provider.  Diarrhea Diarrhea is watery poop (stool). It can make you feel weak, tired, thirsty, or give you a dry mouth (signs of dehydration). Watery poop is a sign of another problem, most often an infection. It often lasts 2-3 days. It can last longer if it is a sign of something serious. Take care of yourself as told by your doctor. HOME CARE   Drink 1 cup (8 ounces) of fluid each time you have watery poop.  Do not drink the following fluids:  Those that contain simple sugars (fructose, glucose, galactose, lactose, sucrose, maltose).  Sports drinks.  Fruit juices.  Whole milk products.  Sodas.  Drinks with caffeine (coffee, tea, soda) or alcohol.  Oral rehydration solution may be used if the doctor says it is okay. You may make your own solution. Follow this recipe:   - teaspoon table salt.   teaspoon baking soda.   teaspoon salt substitute containing potassium chloride.  1 tablespoons sugar.  1 liter (34 ounces) of water.  Avoid the following foods:  High fiber foods, such as raw fruits and vegetables.  Nuts, seeds, and whole grain breads and cereals.   Those that are sweetened with sugar alcohols (xylitol, sorbitol, mannitol).  Try eating the following foods:  Starchy foods, such as rice, toast, pasta, low-sugar cereal, oatmeal, baked potatoes, crackers, and bagels.  Bananas.  Applesauce.  Eat probiotic-rich foods, such as yogurt and milk products that are fermented.  Wash your hands well after each time you have watery poop.  Only take medicine as told by your doctor.  Take a warm bath to help lessen burning or pain from having watery poop. GET HELP RIGHT AWAY IF:   You cannot drink fluids without throwing up (vomiting).  You keep throwing up.  You have blood in your  poop, or your poop looks black and tarry.  You do not pee (urinate) in 6-8 hours, or there is only a small amount of very dark pee.  You have belly (abdominal) pain that gets worse or stays in the same spot (localizes).  You are weak, dizzy, confused, or light-headed.  You have a very bad headache.  Your watery poop gets worse or does not get better.  You have a fever or lasting symptoms for more than 2-3 days.  You have a fever and your symptoms suddenly get worse. MAKE SURE YOU:   Understand these instructions.  Will watch your condition.  Will get help right away if you are not doing well or get worse. Document Released: 02/12/2008 Document Revised: 01/10/2014 Document Reviewed: 05/03/2012 [facility] Patient Information 2015 [place], [place]. This information is not intended to replace advice given to you by your health care provider. Make sure you discuss any questions you have with your health care provider.  Food Choices to Help Relieve Diarrhea When you have diarrhea, the foods you eat and your eating habits are very important. Choosing the right foods and drinks can help relieve diarrhea. Also, because diarrhea can last up to 7 days, you need to replace lost fluids and electrolytes (such as sodium, potassium, and chloride) in order to help prevent dehydration.  WHAT GENERAL GUIDELINES DO I NEED TO FOLLOW?  Slowly drink 1 cup (8 oz) of fluid for each episode of diarrhea. If you are getting enough fluid, your urine will be clear or pale yellow.  Eat starchy foods. Some good choices include white rice, white toast, pasta, low-fiber cereal, baked potatoes (without the skin), saltine crackers, and bagels.  Avoid large servings of any cooked vegetables.  Limit fruit to two servings per day. A serving is  cup or 1 small piece.  Choose foods with less than 2 g of fiber per serving.  Limit fats to less than 8 tsp (38 g) per day.  Avoid fried foods.  Eat foods that have  probiotics in them. Probiotics can be found in certain dairy products.  Avoid foods and beverages that may increase the speed at which food moves through the stomach and intestines (gastrointestinal tract). Things to avoid include:  High-fiber foods, such as dried fruit, raw fruits and vegetables, nuts, seeds, and whole grain foods.  Spicy foods and high-fat foods.  Foods and beverages sweetened with high-fructose corn syrup, honey, or sugar alcohols such as xylitol, sorbitol, and mannitol. WHAT FOODS ARE RECOMMENDED? Grains White rice. White, [place], or pita breads (fresh or toasted), including plain rolls, buns, or bagels. White pasta. Saltine, soda, or graham crackers. Pretzels. Low-fiber cereal. Cooked cereals made with water (such as cornmeal, farina, or cream cereals). Plain muffins. Matzo. Melba toast. Zwieback.  Vegetables Potatoes (without the skin). Strained tomato and vegetable juices. Most well-cooked and canned vegetables without seeds. Tender lettuce. Fruits Cooked or canned applesauce, apricots, cherries, fruit cocktail, grapefruit, peaches, pears, or plums. Fresh bananas, apples without skin, cherries, grapes, cantaloupe, grapefruit, peaches, oranges, or plums.  Meat and Other Protein Products Baked or boiled chicken. Eggs. Tofu. Fish. Seafood. Smooth peanut butter. Ground or well-cooked tender beef, ham, veal, lamb, pork, or poultry.  Dairy Plain yogurt, kefir, and unsweetened liquid yogurt. Lactose-free milk, buttermilk, or soy milk. Plain hard cheese. Beverages Sport drinks. Clear broths. Diluted fruit juices (except prune). Regular, caffeine-free sodas such as ginger ale. Water. Decaffeinated teas. Oral rehydration solutions. Sugar-free beverages not sweetened with sugar alcohols. Other Bouillon, broth, or soups made from recommended foods.  The items listed above may not be a complete list of recommended foods or beverages. Contact your dietitian for more options. WHAT  FOODS ARE NOT RECOMMENDED? Grains Whole grain, whole wheat, bran, or rye breads, rolls, pastas, crackers, and cereals. Wild or [place]mon rice. Cereals that contain more than 2 g of fiber per serving. Corn tortillas or taco shells. Cooked or dry oatmeal. Granola. Popcorn. Vegetables Raw vegetables. Cabbage, broccoli, Brussels sprouts, artichokes, baked beans, beet greens, corn, kale, legumes, peas, sweet potatoes, and yams. Potato skins. Cooked spinach and cabbage. Fruits Dried fruit, including raisins and dates. Raw fruits. Stewed or dried prunes. Fresh apples with skin, apricots, mangoes, pears, raspberries, and strawberries.  Meat and Other Protein Products Chunky peanut butter. Nuts and seeds. Beans and lentils. Berniece Salines.  Dairy High-fat cheeses. Milk, chocolate milk, and beverages made with milk, such as milk shakes. Cream. Ice cream. Sweets and Desserts Sweet rolls, doughnuts, and sweet breads. Pancakes and waffles. Fats and Oils Butter. Cream sauces. Margarine. Salad oils. Plain salad dressings. Olives. Avocados.  Beverages Caffeinated beverages (such as coffee, tea, soda, or energy drinks). Alcoholic beverages. Fruit juices with pulp. Prune juice. Soft drinks sweetened with high-fructose corn syrup or sugar alcohols. Other Coconut. Hot sauce. Chili powder. Mayonnaise. Gravy. Cream-based or milk-based soups.  The items listed above may not be a complete list of foods and beverages to avoid. Contact your dietitian for more information. WHAT SHOULD I DO IF I BECOME DEHYDRATED? Diarrhea can sometimes lead to dehydration. Signs of dehydration include dark urine and dry mouth and skin. If you think you are dehydrated, you should rehydrate with an oral rehydration solution. These solutions can be purchased at pharmacies, retail stores, or online.  Drink -1 cup (120-240 mL) of oral rehydration solution each time you have an episode of diarrhea. If drinking this amount makes your diarrhea worse, try  drinking smaller amounts more often. For example, drink 1-3 tsp (5-15 mL) every 5-10 minutes.  A general rule for staying hydrated is to drink 1-2 L of fluid per day. Talk to your health care provider about the specific amount you should be drinking each day. Drink enough fluids to keep your urine clear or pale yellow. Document Released: 11/16/2003 Document Revised: 08/31/2013 Document Reviewed: 07/19/2013 [facility] Patient Information 2015 [place], [place]. This information is not intended to replace advice given to you by your health care provider. Make sure you discuss any questions you have with your health care provider.  Nausea and Vomiting Nausea means you feel sick to your stomach. Throwing up (vomiting) is a reflex where stomach contents come out of your mouth. HOME CARE   Take medicine as told by your doctor.  Do not force yourself to eat. However, you do need to drink fluids.  If you feel like eating, eat a normal diet as told by your doctor.  Eat rice, wheat, potatoes, bread, lean meats, yogurt, fruits, and vegetables.  Avoid high-fat foods.  Drink enough fluids to keep your pee (urine) clear or  pale yellow.  Ask your doctor how to replace body fluid losses (rehydrate). Signs of body fluid loss (dehydration) include:  Feeling very thirsty.  Dry lips and mouth.  Feeling dizzy.  Dark pee.  Peeing less than normal.  Feeling confused.  Fast breathing or heart rate. GET HELP RIGHT AWAY IF:   You have blood in your throw up.  You have black or bloody poop (stool).  You have a bad headache or stiff neck.  You feel confused.  You have bad belly (abdominal) pain.  You have chest pain or trouble breathing.  You do not pee at least once every 8 hours.  You have cold, clammy skin.  You keep throwing up after 24 to 48 hours.  You have a fever. MAKE SURE YOU:   Understand these instructions.  Will watch your condition.  Will get help right away if you are  not doing well or get worse. Document Released: 02/12/2008 Document Revised: 11/18/2011 Document Reviewed: 01/25/2011 [facility] Patient Information 2015 [place], [place]. This information is not intended to replace advice given to you by your health care provider. Make sure you discuss any questions you have with your health care provider.

## 2014-12-18 LAB — URINE CULTURE
Colony Count: NO GROWTH
Culture: NO GROWTH

## 2015-01-15 ENCOUNTER — Emergency Department (HOSPITAL_COMMUNITY)
Admission: EM | Admit: 2015-01-15 | Discharge: 2015-01-15 | Disposition: A | Discharge status: Home / Self Care | Payer: Medicaid Other | Attending: Emergency Medicine | Admitting: Emergency Medicine

## 2015-01-15 ENCOUNTER — Encounter (HOSPITAL_COMMUNITY): Payer: Self-pay | Admitting: Emergency Medicine

## 2015-01-15 DIAGNOSIS — K029 Dental caries, unspecified: Secondary | ICD-10-CM | POA: Diagnosis not present

## 2015-01-15 DIAGNOSIS — Z79899 Other long term (current) drug therapy: Secondary | ICD-10-CM | POA: Diagnosis not present

## 2015-01-15 DIAGNOSIS — K088 Other specified disorders of teeth and supporting structures: Secondary | ICD-10-CM | POA: Insufficient documentation

## 2015-01-15 DIAGNOSIS — K0889 Other specified disorders of teeth and supporting structures: Secondary | ICD-10-CM

## 2015-01-15 DIAGNOSIS — Z720 Tobacco use: Secondary | ICD-10-CM | POA: Insufficient documentation

## 2015-01-15 MED ORDER — IBUPROFEN 800 MG PO TABS: 800.0000 mg | ORAL_TABLET | Freq: Three times a day (TID) | ORAL | Status: DC

## 2015-01-15 MED ORDER — HYDROCODONE-ACETAMINOPHEN 5-325 MG PO TABS
2.0000 | ORAL_TABLET | Freq: Once | ORAL | Status: AC
  Administered 2015-01-15: 2 via ORAL
  Filled 2015-01-15: qty 2

## 2015-01-15 MED ORDER — PENICILLIN V POTASSIUM 500 MG PO TABS: 500.0000 mg | ORAL_TABLET | Freq: Four times a day (QID) | ORAL | Status: AC

## 2015-01-15 NOTE — ED Notes (Signed)
Pt c/o rt sided dental pain x 3 days.

## 2015-01-15 NOTE — ED Provider Notes (Signed)
CSN: [id number]     Arrival date & time 01/15/15  1759 History  This chart was scribed for Orlie Dakin, MD by Chester Holstein, ED Scribe. This patient was seen in room WTR5/WTR5 and the patient's care was started at 7:02 PM.      Chief Complaint  Patient presents with  . Dental Pain    Patient is a 34 y.o. female presenting with tooth pain. The history is provided by the patient. No language interpreter was used.  Dental Pain Associated symptoms: no facial swelling and no fever    HPI Comments: Linda Odom is a 34 y.o. female who presents to the Emergency Department complaining of right sided dental pain with onset 3 days ago. Pt states she was recently seen in the ED for same and treated with ibuprofen and Abx. Pt finished the course of Abx 1 week ago. She reports no relief from the ibuprofen. She states the pain radiates to bottom of mouth with associated facial tenderness. Pt has been seen by a dentist who was unable to pull the tooth and referred her to a surgeon. Pt states surgeon will not be able to pull the tooth until 02/08/15. Pt denies fever, chills, and facial swelling.   Past Medical History  Diagnosis Date  . Breast nodule   . No pertinent past medical history   . Gastric ulcer    Past Surgical History  Procedure Laterality Date  . Cholecystectomy    . Tubal ligation  2006  . Laparoscopic appendectomy N/A 11/13/2013    Procedure: LAP ASSISTED PARTIAL CECECTOMY;  Surgeon: Odis Hollingshead, MD;  Location: [facility];  Service: General;  Laterality: N/A;  . Leg sugery  1994   Family History  Problem Relation Age of Onset  . Hypertension Mother   . Diabetes Mother   . Hypertension Maternal Grandmother   . Other Neg Hx    History  Substance Use Topics  . Smoking status: Current Some Day Smoker -- 17.00 packs/day  . Smokeless tobacco: Never Used  . Alcohol Use: Yes     Comment: occ   OB History    Gravida Para Term Preterm AB TAB SAB Ectopic Multiple Living   5 3 3   2  2   3      Review of Systems  Constitutional: Negative for fever and chills.  HENT: Positive for dental problem. Negative for facial swelling.      Allergies  Review of patient's allergies indicates no known allergies.  Home Medications   Prior to Admission medications   Medication Sig Start Date End Date Taking? Authorizing Provider  dicyclomine (BENTYL) 20 MG tablet Take 1 tablet (20 mg total) by mouth every 6 (six) hours as needed for spasms (for abdominal cramping). 12/17/14   Linton Flemings, MD  ibuprofen (ADVIL,MOTRIN) 800 MG tablet Take 1 tablet (800 mg total) by mouth 3 (three) times daily. 01/15/15   Dahlia Bailiff, PA-C  ipratropium (ATROVENT) 0.06 % nasal spray Place 2 sprays into both nostrils 4 (four) times daily. Patient taking differently: Place 2 sprays into both nostrils 4 (four) times daily as needed for rhinitis.  09/08/13   Gregor Hams, MD  omeprazole (PRILOSEC OTC) 20 MG tablet Take 1 tablet (20 mg total) by mouth daily. 08/12/12 11/05/14  Manya Silvas, CNM  ondansetron (ZOFRAN ODT) 8 MG disintegrating tablet Take 1 tablet (8 mg total) by mouth every 8 (eight) hours as needed for nausea or vomiting. 12/17/14   Linton Flemings, MD  penicillin v potassium (VEETID) 500 MG tablet Take 1 tablet (500 mg total) by mouth 4 (four) times daily. 01/15/15 01/22/15  Dahlia Bailiff, PA-C  traMADol (ULTRAM) 50 MG tablet Take 50 mg by mouth every 6 (six) hours as needed.     Historical Provider, MD   BP 127/78 mmHg  Pulse 65  Temp(Src) 98.3 F (36.8 C) (Oral)  Resp 16  SpO2 100%  LMP 12/07/2014 Physical Exam  Constitutional: She is oriented to person, place, and time. She appears well-developed and well-nourished.  HENT:  Head: Normocephalic.  Mouth/Throat: Oropharynx is clear and moist and mucous membranes are normal. No oral lesions. No trismus in the jaw. No dental abscesses or uvula swelling. No oropharyngeal exudate, posterior oropharyngeal edema, posterior oropharyngeal erythema or tonsillar  abscesses.  Mild dental caries to 2nd upper right premolar Gumline is intact No obvious facial swelling. No gross abscess.  Eyes: Conjunctivae are normal.  Neck: Normal range of motion. Neck supple.  Pulmonary/Chest: Effort normal.  Musculoskeletal: Normal range of motion.  Neurological: She is alert and oriented to person, place, and time.  Skin: Skin is warm and dry.  Psychiatric: She has a normal mood and affect. Her behavior is normal.  Nursing note and vitals reviewed.   ED Course  Procedures (including critical care time) DIAGNOSTIC STUDIES: Oxygen Saturation is 100% on room air, normal by my interpretation.    COORDINATION OF CARE: 7:08 PM Discussed treatment plan with patient at beside, the patient agrees with the plan and has no further questions at this time.   Labs Review Labs Reviewed - No data to display  Imaging Review No results found.   EKG Interpretation None      MDM   Final diagnoses:  Pain, dental   Patient with toothache.  Patient afebrile, hemodynamically stable, well-appearing and in no acute distress. No gross abscess.  Exam unconcerning for Ludwig's angina or spread of infection.  Will treat with penicillin and pain medicine.  Urged patient to follow-up with dentist.  Discussed return precautions with patient, patient verbalizes understanding and agreement of this plan.   I personally performed the services described in this documentation, which was scribed in my presence. The recorded information has been reviewed and is accurate.  BP 127/78 mmHg  Pulse 65  Temp(Src) 98.3 F (36.8 C) (Oral)  Resp 16  SpO2 100%  LMP 12/07/2014  Signed,  Dahlia Bailiff, PA-C 7:34 PM     Dahlia Bailiff, PA-C 01/15/15 1934  Orlie Dakin, MD 01/15/15 2329

## 2015-01-15 NOTE — Discharge Instructions (Signed)
Dental Pain °A tooth ache may be caused by cavities (tooth decay). Cavities expose the nerve of the tooth to air and hot or cold temperatures. It may come from an infection or abscess (also called a boil or furuncle) around your tooth. It is also often caused by dental caries (tooth decay). This causes the pain you are having. °DIAGNOSIS  °Your caregiver can diagnose this problem by exam. °TREATMENT  °· If caused by an infection, it may be treated with medications which kill germs (antibiotics) and pain medications as prescribed by your caregiver. Take medications as directed. °· Only take over-the-counter or prescription medicines for pain, discomfort, or fever as directed by your caregiver. °· Whether the tooth ache today is caused by infection or dental disease, you should see your dentist as soon as possible for further care. °SEEK MEDICAL CARE IF: °The exam and treatment you received today has been provided on an emergency basis only. This is not a substitute for complete medical or dental care. If your problem worsens or new problems (symptoms) appear, and you are unable to meet with your dentist, call or return to this location. °SEEK IMMEDIATE MEDICAL CARE IF:  °· You have a fever. °· You develop redness and swelling of your face, jaw, or neck. °· You are unable to open your mouth. °· You have severe pain uncontrolled by pain medicine. °MAKE SURE YOU:  °· Understand these instructions. °· Will watch your condition. °· Will get help right away if you are not doing well or get worse. °Document Released: 08/26/2005 Document Revised: 11/18/2011 Document Reviewed: 04/13/2008 °ExitCare® Patient Information ©2015 [employer]. This information is not intended to replace advice given to you by your health care provider. Make sure you discuss any questions you have with your health care provider. ° °Emergency Department Resource Guide °1) Find a Doctor and Pay Out of Pocket °Although you won't have to find out who  is covered by your insurance plan, it is a good idea to ask around and get recommendations. You will then need to call the office and see if the doctor you have chosen will accept you as a new patient and what types of options they offer for patients who are self-pay. Some doctors offer discounts or will set up payment plans for their patients who do not have insurance, but you will need to ask so you aren't surprised when you get to your appointment. ° °2) Contact Your Local Health Department °Not all health departments have doctors that can see patients for sick visits, but many do, so it is worth a call to see if yours does. If you don't know where your local health department is, you can check in your phone book. The CDC also has a tool to help you locate your state's health department, and many state websites also have listings of all of their local health departments. ° °3) Find a Walk-in Clinic °If your illness is not likely to be very severe or complicated, you may want to try a walk in clinic. These are popping up all over the country in pharmacies, drugstores, and shopping centers. They're usually staffed by nurse practitioners or physician assistants that have been trained to treat common illnesses and complaints. They're usually fairly quick and inexpensive. However, if you have serious medical issues or chronic medical problems, these are probably not your best option. ° °No Primary Care Doctor: °- Call [facility] at  832-8000 - they can help you locate a primary   care doctor that  accepts your insurance, provides certain services, etc. °- Physician Referral Service- 1-800-533-3463 ° °Chronic Pain Problems: °Organization         Address  Phone   Notes  °[facility]  (336) 297-2271 Patients need to be referred by their primary care doctor.  ° °Medication Assistance: °Organization         Address  Phone   Notes  °[place] [facility] [address]  [address] °[place], [place] [address] (336) 641-8030 --Must be a resident of [place] °-- Must have NO insurance coverage whatsoever (no Medicaid/ Medicare, etc.) °-- The pt. MUST have a primary care doctor that directs their care regularly and follows them in the community °  °[facility]  (866) 331-1348   °[employer]  (888) 892-1162   ° °Agencies that provide inexpensive medical care: °Organization         Address  Phone   Notes  °[facility]  (336) 832-8035   °[facility]    (336) 832-7272   °[facility] [address] °[place], [place] [address] (336) 832-4777   °[facility] [address], °[place] (336) 271-4999   °[facility]    (336) 373-0678   °[facility]    (336) 272-1050   °[facility] ° [address], [place] Phone:  (336) 832-4444, Fax:  (336) 832-4440 Hours of Operation:  9 am - 6 pm, M-F.  Also accepts Medicaid/Medicare and self-pay.  °[facility] ° [address], [place] Phone: (336) 832-3150, Fax: (336) 832-3151. Hours of Operation:  8:30 am - 5:30 pm, M-F.  Also accepts Medicaid and self-pay.  °[facility] [place] [address], [place] Phone: (336) 878-6027   °[facility] [address], [place], [place] (336)723-1848, Ext. 123 Mondays & Thursdays: 7-9 AM.  First 15 patients are seen on a first come, first serve basis. °  ° °Medicaid-accepting [place] Providers: ° °Organization         Address  Phone   Notes  °[facility] [address], [place] (336) 641-2100 Also accepts self-pay patients.  °[facility] [address], [place] ° (336) 856-9996   °[facility] [address], [place] (336) 288-8857   °[facility] [address] [place] [address], [place] (336) 299-7000   °Veita Bland [address], [place]  ° (336) 373-1557 Only accepts [place] Access Medicaid patients after they have their name applied to their card.  ° °Self-Pay (no insurance) in [place]: ° °Organization         Address  Phone   Notes  °Sickle Cell Patients, [facility] [address], [place] (336) 832-1970   °[facility] [address], [place] (336) 832-4400   °[facility] ° [address], [place] (336) 992-4800   °[facility]/Dr. Osei-Bonsu ° [address] [place] [address], [place] or [address], [place] (336) 841-8500 Phone number for both [place] and [place] locations is the same.  °[facility] [address], [place] (336) 299-0000   °[facility] [address] [place] [address],  or [address] (336) 852-7530 °(336) 878-2260   °  [facility] [address], [place] (336) 350-1642, phone; (336) 294-5005, fax Sees patients 1st and 3rd Saturday of every month.  Must not qualify for public or private insurance (i.e. Medicaid, Medicare, [place] Health Choice, Veterans' Benefits) • Household income should be no more than 200% of the poverty level •The clinic cannot treat you if you are pregnant or think you are pregnant • Sexually transmitted diseases are not treated at the clinic.  ° ° °Dental Care: °Organization         Address  Phone  Notes  °[facility] [facility] [address], [place] (336) 641-6152 Accepts children up to age 21 who are enrolled in Medicaid or [place] Health Choice; pregnant women with a Medicaid card; and children who have applied for Medicaid or [place] Health Choice, but were declined, whose parents can pay a reduced fee at time of service.  °[facility] [place]  [address], [place] (336) 641-7733 Accepts children up to age 21 who are  enrolled in Medicaid or [place] Health Choice; pregnant women with a Medicaid card; and children who have applied for Medicaid or [place] Health Choice, but were declined, whose parents can pay a reduced fee at time of service.  °[facility] ° [address], [place] (336) 641-4533 Patients are seen by appointment only. Walk-ins are not accepted. [facility] will see patients 18 years of age and older. °Monday - Tuesday (8am-5pm) °Most Wednesdays (8:30-5pm) °$30 per visit, cash only  °[facility] ° [address], [place] (336) 641-4533 Patients are seen by appointment only. Walk-ins are not accepted. [facility] will see patients 18 years of age and older. °One Wednesday Evening (Monthly: Volunteer Based).  $30 per visit, cash only  °[facility]  (919) 537-3737 for adults; Children under age 4, call Graduate Pediatric Dentistry at (919) [phone]. Children aged 4-14, please call (919) [phone] to request a pediatric application. ° Dental services are provided in all areas of dental care including fillings, crowns and bridges, complete and partial dentures, implants, gum treatment, root canals, and extractions. Preventive care is also provided. Treatment is provided to both adults and children. °Patients are selected via a lottery and there is often a waiting list. °  °[facility] [address], °[place] ° (336) 763-8833 [email or web address] °  °[facility] [address], [place], [place] (336)723-1848, Ext. 123 Second and Fourth Thursday of each month, opens at 6:30 AM; Clinic ends at 9 AM.  Patients are seen on a first-come first-served basis, and a limited number are seen during each clinic.  ° °[facility] ° [address], [place], [place] (336) 723-7904   Eligibility Requirements °You must have lived in [place], or [place] counties for at least the last three months. °  You  cannot be eligible for state or federal sponsored healthcare insurance, including Veterans Administration, Medicaid, or Medicare. °  You generally cannot be eligible for healthcare insurance through your employer.  °  How to apply: °Eligibility screenings are held every Tuesday and Wednesday afternoon from 1:00 pm until 4:00 pm. You do not need an appointment for the interview!  °[facility] [address], [place], [place] 336-631-2330   °[facility] [facility]  336-342-8273   °[facility]  336-703-3100   °[facility]  336-570-6415   ° °Behavioral Health Resources in the Community: °Intensive Outpatient Programs °Organization         Address  Phone  Notes  °[facility] [address], [place], [place] 336-878-6098   °[facility] [address], [place], [place] 336-832-9800   °[facility] [address], [place], [place] ° 336-882-2125   °[facility] [address],  °[place], [place] 1-800-853-5163 or 336-641-4981   °Substance Abuse Resources °Organization         Address  Phone  Notes  °[facility]  336-882-2125   °[facility]  336-784-9470   °[facility]  336-285-9073   °[facility]  336-845-3988   °Residential & Outpatient Substance Abuse Program  1-800-659-3381   °Psychological Services °Organization         Address  Phone  Notes  °[facility]  336- 832-9600   °[facility]  336- 378-7881   °[facility] [address], [place] 1-800-853-5163 or 336-641-4981   ° °Mobile Crisis Teams °Organization         Address  Phone  Notes  °[facility], Mobile Crisis Care Unit  1-877-626-1772   °Assertive °[facility] ° [address] [place], [place] 336-834-9664   °Sharon DeEsch [address] °[place] 336-554-5454   ° °Self-Help/Support  Groups °Organization         Address  Phone             Notes  °[facility] [place] - variety of support groups  336- 373-1402 Call for more information  °Narcotics Anonymous (NA), [facility] [address], °[place] [employer]  2 meetings at this location  ° °Residential Treatment Programs °Organization         Address  Phone  Notes  °[facility] [address],    °[place]  1-866-801-8205   °[facility] ° [address], [place], [place] 704-293-8524   °[facility] [address], [place] 336-845-3988 Admissions: 8am-3pm M-F  °[facility] [address]    °[place], [place] 336-841-1104   °[facility] [address], [place], [place] 336-379-7146   °[facility] [address]  °[place], [place] 336-285-9073   °[facility] - Intensive Outpatient [address], [place], [place] 336-852-3033   °[facility] [address]  °[place], [place] 1-877-615-2722 or 336-784-9470   °[facility] [address] [place], [place] 336-227-7417 Accepts Medicaid  °[facility] [address]  °[place] 1-800-659-3381 Substance Abuse/Addiction Treatment  ° °[place] Behavioral Health Resources °Organization         Address  Phone  Notes  °[facility]  (888) 581-9988   °Julie Brannon, PhD [address] [place], [place]   (336) 349-5553 or (336) 951-0000   °[facility]   [address] °[place], [place] (336) 349-4454   °[facility] [address], [place],  (336) 342-8316 Insurance/Medicaid/sponsorship through [facility]  °[facility] [address]                                    [place],  (336) 342-8316 Therapy/tele-psych/case  °[facility]   [address]  ° [place], [place] (336) 349-2233    °Dr. Arfeen  (336) 349-4544   °[facility] [place]  [employer] [facility] [place] [facility] 1) [address], [place] °2) [address], [place] °3)  [address], [place] (336) 349-3220 °(336) 342-7768 ° °(336) 342-8140   °[place] [facility] (336) 342-1394 or (336) 342-3537 (After Hours)    ° ° ° °

## 2015-03-14 IMAGING — CT CT ABD-PELV W/ CM
1 of 2 series · 15 of 32 positions shown, 19 images · IV contrast (OMNIPAQUE 300)
Comparison: None.

CLINICAL DATA: Right lower quadrant abdominal pain

EXAM:
CT ABDOMEN AND PELVIS WITH CONTRAST
TECHNIQUE: Multidetector CT imaging of the abdomen and pelvis was performed
using the standard protocol following bolus administration of
intravenous contrast.
CONTRAST:  50mL OMNIPAQUE IOHEXOL 300 MG/ML SOLN, 100mL OMNIPAQUE
IOHEXOL 300 MG/ML SOLN

[Series 2: abd/pel with · axial · 0.88mm/px · z∈[-429,-39]mm · 15 of 86 slices shown, 19 images]
[im 4/86  soft-tissue]
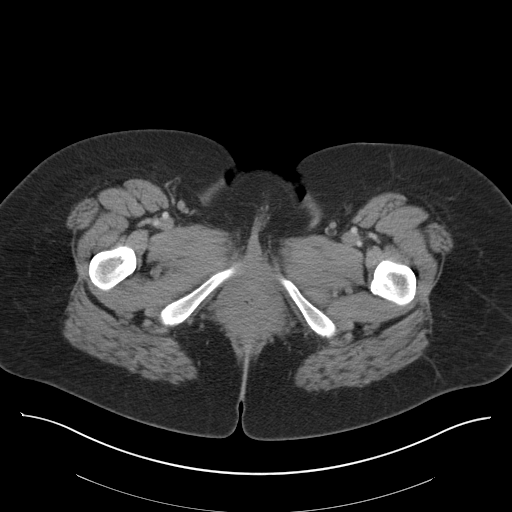
[im 4/86  bone]
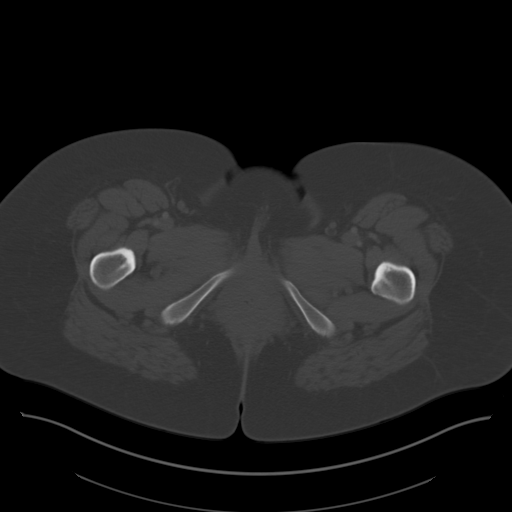
[im 11/86  soft-tissue]
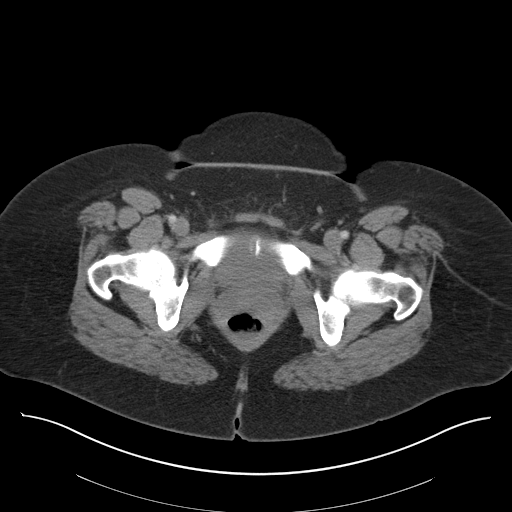
[im 18/86  soft-tissue]
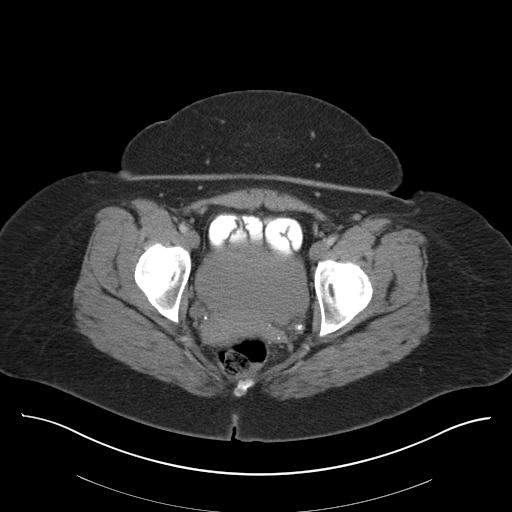
[im 25/86  soft-tissue]
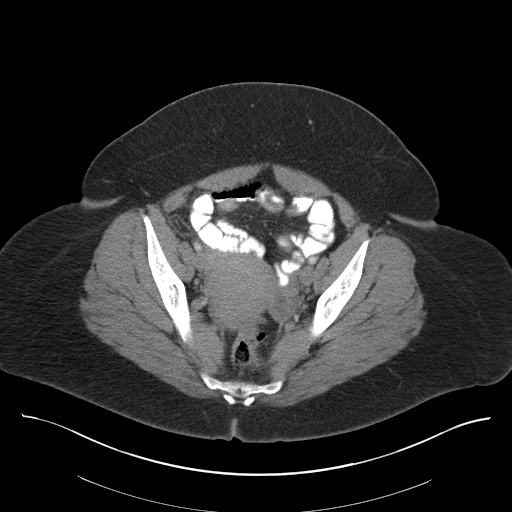
[im 29/86  soft-tissue]
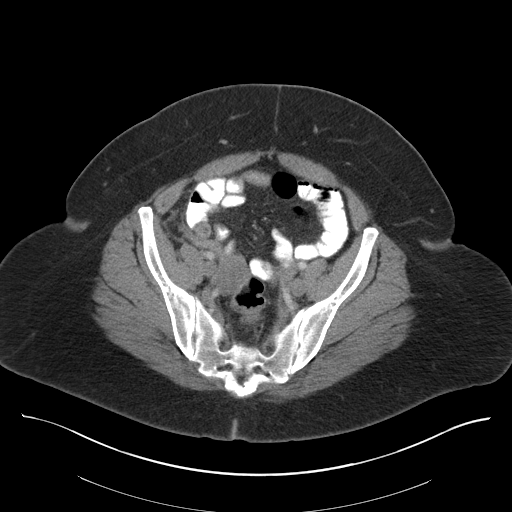
[im 36/86  soft-tissue]
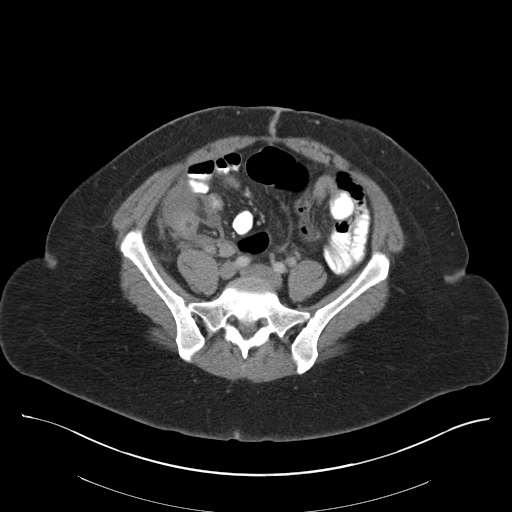
[im 43/86  soft-tissue]
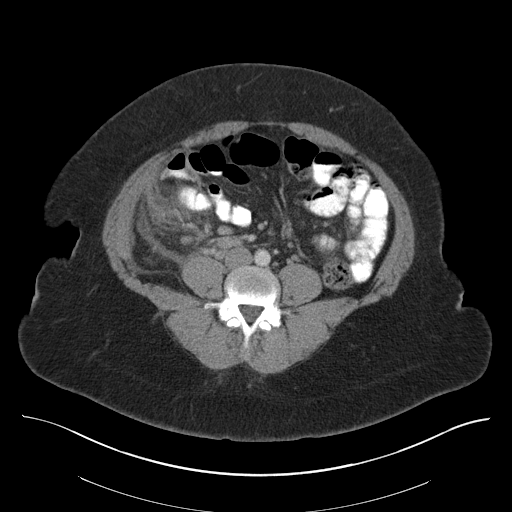
[im 50/86  soft-tissue]
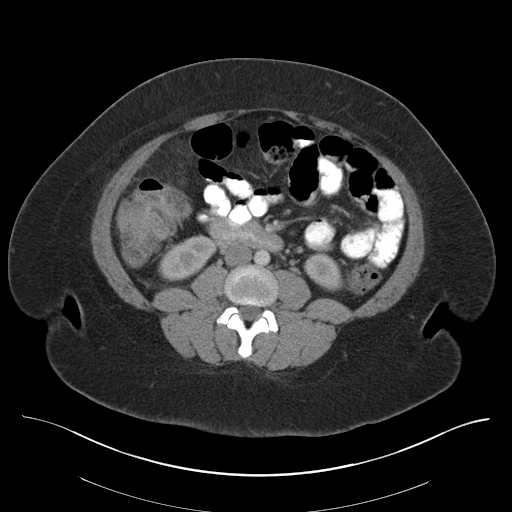
[im 57/86  soft-tissue]
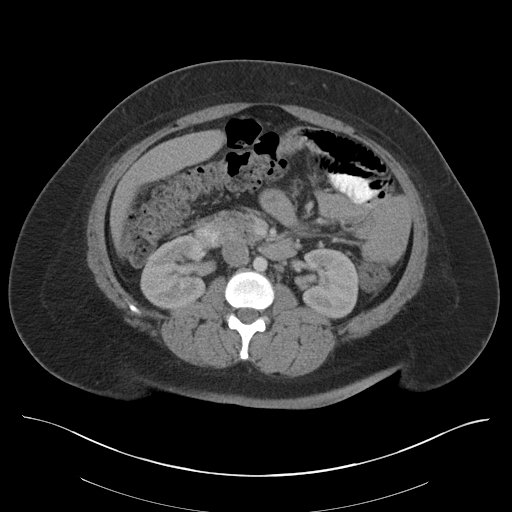
[im 57/86  bone]
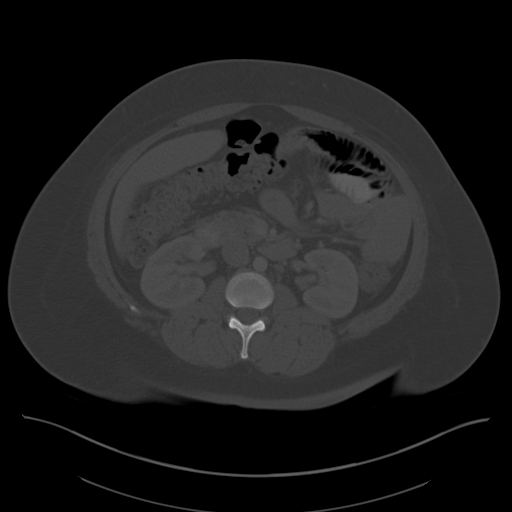
[im 61/86  soft-tissue]
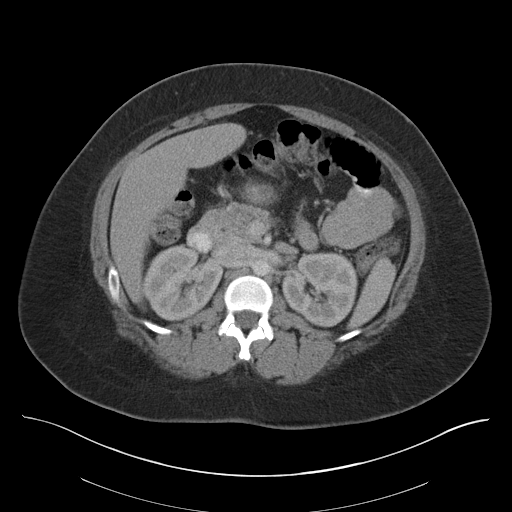
[im 68/86  soft-tissue]
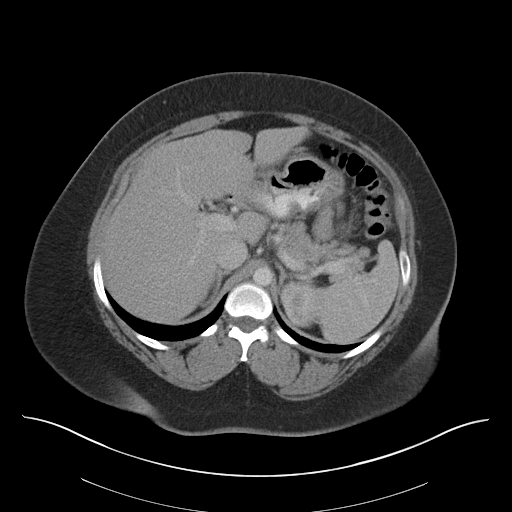
[im 71/86  lung]
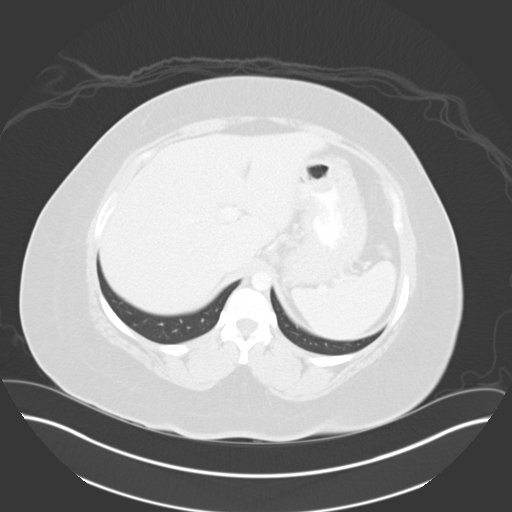
[im 75/86  soft-tissue]
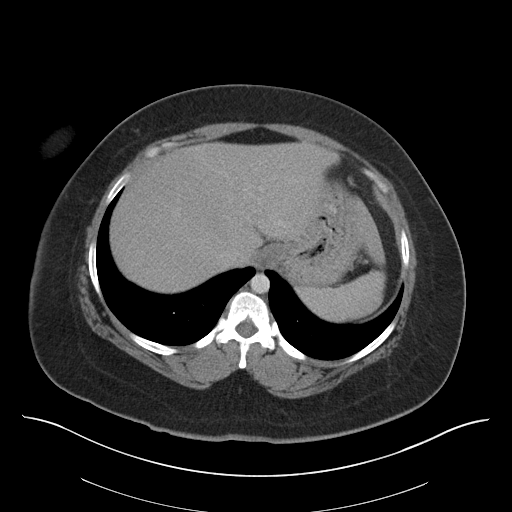
[im 75/86  lung]
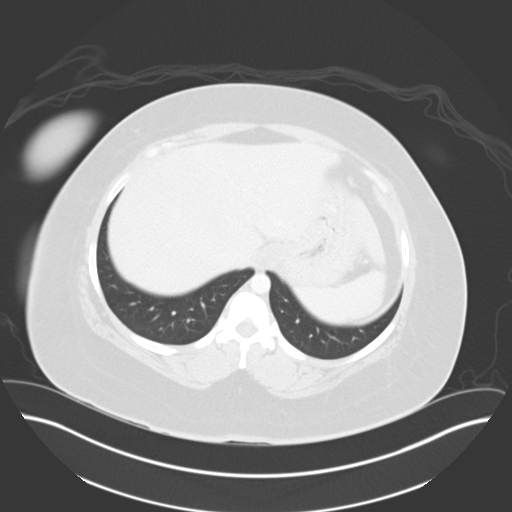
[im 78/86  lung]
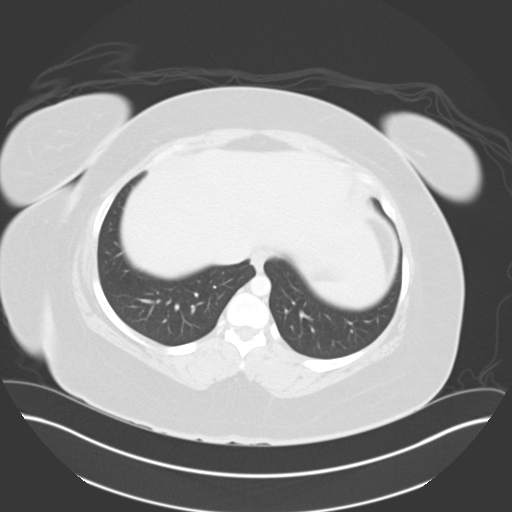
[im 82/86  soft-tissue]
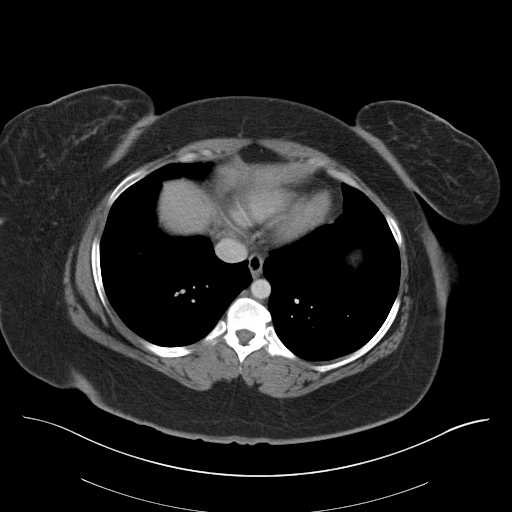
[im 82/86  lung]
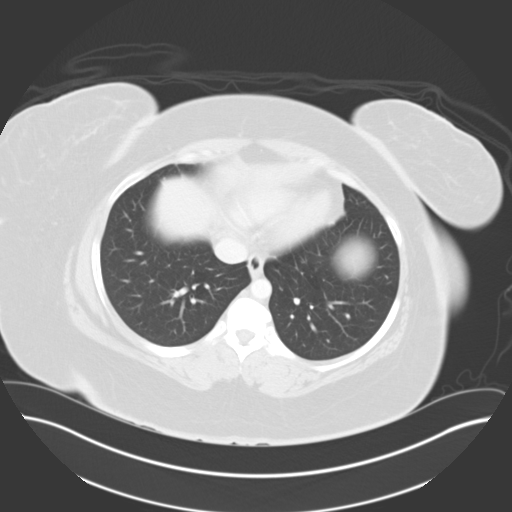

[15 of 32 positions shown; findings below may reference images not displayed]

FINDINGS: BODY WALL: Unremarkable.

LOWER CHEST: Unremarkable.

ABDOMEN/PELVIS:

Liver: No focal abnormality.

Biliary: Cholecystectomy.

Pancreas: Unremarkable.

Spleen: Unremarkable.

Adrenals: Unremarkable.

Kidneys and ureters: No hydronephrosis or stone.

Bladder: Unremarkable.

Reproductive: Unremarkable.

Bowel: There is marked thickening of the appendix wall, with central
fluid filling. Local peritoneal enhancement and lymphadenopathy.
Although the changes are extensive, there is no definitive
perforation or abscess. Reactive inflammation of the cecum.

Retroperitoneum: Ileocolic adenopathy, reactive.

OSSEOUS: No acute abnormalities.

Critical Value/emergent results were called by telephone at the time
of interpretation on 11/13/2013 at [DATE] to Dr. MZILO QOKELA , who
verbally acknowledged these results.
IMPRESSION: Acute appendicitis. The inflammation is advanced, but no perforation
or abscess.

## 2015-04-17 ENCOUNTER — Emergency Department (HOSPITAL_COMMUNITY)
Admission: EM | Admit: 2015-04-17 | Discharge: 2015-04-17 | Disposition: A | Discharge status: Home / Self Care | Payer: Medicaid Other | Source: Home / Self Care | Attending: Family Medicine | Admitting: Family Medicine

## 2015-04-17 ENCOUNTER — Encounter (HOSPITAL_COMMUNITY): Payer: Self-pay | Admitting: Emergency Medicine

## 2015-04-17 DIAGNOSIS — K0889 Other specified disorders of teeth and supporting structures: Secondary | ICD-10-CM

## 2015-04-17 DIAGNOSIS — K088 Other specified disorders of teeth and supporting structures: Secondary | ICD-10-CM

## 2015-04-17 LAB — POCT URINALYSIS DIP (DEVICE)
BILIRUBIN URINE: NEGATIVE
Glucose, UA: NEGATIVE mg/dL
Hgb urine dipstick: NEGATIVE
Ketones, ur: NEGATIVE mg/dL
LEUKOCYTES UA: NEGATIVE
NITRITE: NEGATIVE
PROTEIN: NEGATIVE mg/dL
UROBILINOGEN UA: 0.2 mg/dL (ref 0.0–1.0)
pH: 5 (ref 5.0–8.0)

## 2015-04-17 LAB — POCT PREGNANCY, URINE: Preg Test, Ur: NEGATIVE

## 2015-04-17 MED ORDER — CLINDAMYCIN HCL 300 MG PO CAPS: 300.0000 mg | ORAL_CAPSULE | Freq: Three times a day (TID) | ORAL | Status: DC

## 2015-04-17 NOTE — Discharge Instructions (Signed)
Take medicine as prescribed, see your dentist as soon as possible °

## 2015-04-17 NOTE — ED Notes (Signed)
Multiple complaints: right upper toothache.  Concerned for uti.  Reports odor to urine and "can taste it" when she has infection.  Reports increase in trips to toilet.  History of the same

## 2015-04-17 NOTE — ED Notes (Signed)
Patient returned to treatment room asking, insisting on pain medication for neck and stomach discomfort.

## 2015-04-17 NOTE — ED Provider Notes (Signed)
CSN: [id number]     Arrival date & time 04/17/15  1259 History   First MD Initiated Contact with Patient 04/17/15 1319     Chief Complaint  Patient presents with  . Pain   (Consider location/radiation/quality/duration/timing/severity/associated sxs/prior Treatment) Patient is a 34 y.o. female presenting with tooth pain.  Dental Pain Location:  Upper Upper teeth location:  5/RU 1st bicuspid Quality:  Throbbing Severity:  Mild Onset quality:  Gradual Duration:  3 days Timing:  Constant Chronicity:  New Context: dental caries and poor dentition   Relieved by:  None tried Worsened by:  Nothing tried Ineffective treatments:  None tried Associated symptoms: facial pain and neck pain   Associated symptoms: no fever and no gum swelling   Risk factors: lack of dental care and smoking     Past Medical History  Diagnosis Date  . Breast nodule   . No pertinent past medical history   . Gastric ulcer    Past Surgical History  Procedure Laterality Date  . Cholecystectomy    . Tubal ligation  2006  . Laparoscopic appendectomy N/A 11/13/2013    Procedure: LAP ASSISTED PARTIAL CECECTOMY;  Surgeon: Odis Hollingshead, MD;  Location: [facility];  Service: General;  Laterality: N/A;  . Leg sugery  1994   Family History  Problem Relation Age of Onset  . Hypertension Mother   . Diabetes Mother   . Hypertension Maternal Grandmother   . Other Neg Hx    History  Substance Use Topics  . Smoking status: Current Some Day Smoker -- 17.00 packs/day  . Smokeless tobacco: Never Used  . Alcohol Use: Yes     Comment: occ   OB History    Gravida Para Term Preterm AB TAB SAB Ectopic Multiple Living   5 3 3  2  2   3      Review of Systems  Constitutional: Negative for fever.  HENT: Positive for dental problem.   Genitourinary: Positive for dysuria, urgency and frequency. Negative for vaginal bleeding, vaginal discharge and pelvic pain.  Musculoskeletal: Positive for neck pain.    Allergies    Review of patient's allergies indicates no known allergies.  Home Medications   Prior to Admission medications   Medication Sig Start Date End Date Taking? Authorizing Provider  clindamycin (CLEOCIN) 300 MG capsule Take 1 capsule (300 mg total) by mouth 3 (three) times daily. 04/17/15   Billy Fischer, MD  dicyclomine (BENTYL) 20 MG tablet Take 1 tablet (20 mg total) by mouth every 6 (six) hours as needed for spasms (for abdominal cramping). 12/17/14   Linton Flemings, MD  ibuprofen (ADVIL,MOTRIN) 800 MG tablet Take 1 tablet (800 mg total) by mouth 3 (three) times daily. 01/15/15   Dahlia Bailiff, PA-C  ipratropium (ATROVENT) 0.06 % nasal spray Place 2 sprays into both nostrils 4 (four) times daily. Patient taking differently: Place 2 sprays into both nostrils 4 (four) times daily as needed for rhinitis.  09/08/13   Gregor Hams, MD  omeprazole (PRILOSEC OTC) 20 MG tablet Take 1 tablet (20 mg total) by mouth daily. 08/12/12 11/05/14  Manya Silvas, CNM  ondansetron (ZOFRAN ODT) 8 MG disintegrating tablet Take 1 tablet (8 mg total) by mouth every 8 (eight) hours as needed for nausea or vomiting. 12/17/14   Linton Flemings, MD  traMADol (ULTRAM) 50 MG tablet Take 50 mg by mouth every 6 (six) hours as needed.     Historical Provider, MD   BP 145/90 mmHg  Pulse  84  Temp(Src) 98.2 F (36.8 C) (Oral)  Resp 16  SpO2 100%  LMP 03/24/2015 Physical Exam  Constitutional: She is oriented to person, place, and time. She appears well-developed and well-nourished.  HENT:  Mouth/Throat: Abnormal dentition. Dental caries present.    Eyes: Conjunctivae and EOM are normal. Pupils are equal, round, and reactive to light.  Neck: Normal range of motion. Neck supple.  Abdominal: Soft. Bowel sounds are normal. There is tenderness in the suprapubic area. There is no rigidity, no rebound and no CVA tenderness.  Neurological: She is alert and oriented to person, place, and time.  Skin: Skin is warm and dry.  Nursing note and vitals  reviewed.   ED Course  Procedures (including critical care time) Labs Review Labs Reviewed  POCT URINALYSIS DIP (DEVICE)  POCT PREGNANCY, URINE   U/a neg. Imaging Review No results found.   MDM   1. Pain, dental        Billy Fischer, MD 04/17/15 [phone]

## 2015-05-18 ENCOUNTER — Emergency Department (HOSPITAL_COMMUNITY): Payer: Medicaid Other

## 2015-05-18 ENCOUNTER — Emergency Department (HOSPITAL_COMMUNITY)
Admission: EM | Admit: 2015-05-18 | Discharge: 2015-05-18 | Disposition: A | Discharge status: Home / Self Care | Payer: Medicaid Other | Attending: Emergency Medicine | Admitting: Emergency Medicine

## 2015-05-18 ENCOUNTER — Encounter (HOSPITAL_COMMUNITY): Payer: Self-pay | Admitting: Emergency Medicine

## 2015-05-18 DIAGNOSIS — R05 Cough: Secondary | ICD-10-CM | POA: Diagnosis present

## 2015-05-18 DIAGNOSIS — J069 Acute upper respiratory infection, unspecified: Secondary | ICD-10-CM | POA: Diagnosis not present

## 2015-05-18 DIAGNOSIS — Z8719 Personal history of other diseases of the digestive system: Secondary | ICD-10-CM | POA: Diagnosis not present

## 2015-05-18 DIAGNOSIS — Z720 Tobacco use: Secondary | ICD-10-CM | POA: Diagnosis not present

## 2015-05-18 DIAGNOSIS — R059 Cough, unspecified: Secondary | ICD-10-CM

## 2015-05-18 DIAGNOSIS — Z792 Long term (current) use of antibiotics: Secondary | ICD-10-CM | POA: Diagnosis not present

## 2015-05-18 DIAGNOSIS — Z791 Long term (current) use of non-steroidal anti-inflammatories (NSAID): Secondary | ICD-10-CM | POA: Insufficient documentation

## 2015-05-18 DIAGNOSIS — Z8742 Personal history of other diseases of the female genital tract: Secondary | ICD-10-CM | POA: Insufficient documentation

## 2015-05-18 MED ORDER — DEXTROMETHORPHAN-GUAIFENESIN 5-100 MG/5ML PO LIQD: 5.0000 mL | Freq: Two times a day (BID) | ORAL | Status: DC

## 2015-05-18 MED ORDER — GUAIFENESIN-DM 100-10 MG/5ML PO SYRP
5.0000 mL | ORAL_SOLUTION | ORAL | Status: DC | PRN
  Administered 2015-05-18: 5 mL via ORAL
  Filled 2015-05-18: qty 10

## 2015-05-18 MED ORDER — PSEUDOEPHEDRINE HCL ER 120 MG PO TB12
120.0000 mg | ORAL_TABLET | Freq: Two times a day (BID) | ORAL | Status: DC
  Administered 2015-05-18: 120 mg via ORAL
  Filled 2015-05-18: qty 1

## 2015-05-18 MED ORDER — PSEUDOEPHEDRINE HCL ER 120 MG PO TB12: 120.0000 mg | ORAL_TABLET | Freq: Two times a day (BID) | ORAL | Status: DC | PRN

## 2015-05-18 NOTE — ED Provider Notes (Signed)
CSN: [id number]     Arrival date & time 05/18/15  2149 History  This chart was scribed for non-physician practitioner, Garald Balding, NP, working with Dorie Rank, MD, by Helane Gunther ED Scribe. This patient was seen in room WTR5/WTR5 and the patient's care was started at 10:20 PM    Chief Complaint  Patient presents with  . Cough  . Nasal Congestion   The history is provided by the patient. No language interpreter was used.   HPI Comments: Linda Odom is a 34 y.o. female who presents to the Emergency Department complaining of cough and nasal congestion onset 1 week ago. She reports associated post-tussive chest soreness. She has taken benadryl, robitussin, and TheraFlu with mild relief. She notes a PMHx of PNA and states that this feels similar. She is currently on her period. Pt denies fever. She has NKDA.  Past Medical History  Diagnosis Date  . Breast nodule   . No pertinent past medical history   . Gastric ulcer    Past Surgical History  Procedure Laterality Date  . Cholecystectomy    . Tubal ligation  2006  . Laparoscopic appendectomy N/A 11/13/2013    Procedure: LAP ASSISTED PARTIAL CECECTOMY;  Surgeon: Odis Hollingshead, MD;  Location: [facility];  Service: General;  Laterality: N/A;  . Leg sugery  1994   Family History  Problem Relation Age of Onset  . Hypertension Mother   . Diabetes Mother   . Hypertension Maternal Grandmother   . Other Neg Hx    Social History  Substance Use Topics  . Smoking status: Current Some Day Smoker -- 17.00 packs/day  . Smokeless tobacco: Never Used  . Alcohol Use: Yes     Comment: occ   OB History    Gravida Para Term Preterm AB TAB SAB Ectopic Multiple Living   5 3 3  2  2   3      Review of Systems  Constitutional: Negative for fever and chills.  HENT: Positive for congestion, postnasal drip and rhinorrhea. Negative for sore throat.   Respiratory: Positive for cough and shortness of breath. Negative for wheezing and stridor.   All  other systems reviewed and are negative.   Allergies  Review of patient's allergies indicates no known allergies.  Home Medications   Prior to Admission medications   Medication Sig Start Date End Date Taking? Authorizing Provider  clindamycin (CLEOCIN) 300 MG capsule Take 1 capsule (300 mg total) by mouth 3 (three) times daily. 04/17/15   Billy Fischer, MD  Dextromethorphan-Guaifenesin (DELSYM COUGH/CHEST CONGEST DM) 5-100 MG/5ML LIQD Take 5 mLs by mouth 2 (two) times daily. 05/18/15   Junius Creamer, NP  dicyclomine (BENTYL) 20 MG tablet Take 1 tablet (20 mg total) by mouth every 6 (six) hours as needed for spasms (for abdominal cramping). 12/17/14   Linton Flemings, MD  ibuprofen (ADVIL,MOTRIN) 800 MG tablet Take 1 tablet (800 mg total) by mouth 3 (three) times daily. 01/15/15   Dahlia Bailiff, PA-C  ipratropium (ATROVENT) 0.06 % nasal spray Place 2 sprays into both nostrils 4 (four) times daily. Patient taking differently: Place 2 sprays into both nostrils 4 (four) times daily as needed for rhinitis.  09/08/13   Gregor Hams, MD  omeprazole (PRILOSEC OTC) 20 MG tablet Take 1 tablet (20 mg total) by mouth daily. 08/12/12 11/05/14  Manya Silvas, CNM  ondansetron (ZOFRAN ODT) 8 MG disintegrating tablet Take 1 tablet (8 mg total) by mouth every 8 (eight) hours as  needed for nausea or vomiting. 12/17/14   Linton Flemings, MD  pseudoephedrine (SUDAFED) 120 MG 12 hr tablet Take 1 tablet (120 mg total) by mouth every 12 (twelve) hours as needed for congestion. 05/18/15   Junius Creamer, NP  traMADol (ULTRAM) 50 MG tablet Take 50 mg by mouth every 6 (six) hours as needed.     Historical Provider, MD   BP 106/76 mmHg  Pulse 99  Temp(Src) 98.3 F (36.8 C) (Oral)  Resp 22  SpO2 92%  LMP 05/18/2015 Physical Exam  Constitutional: She appears well-developed and well-nourished.  HENT:  Head: Normocephalic.  Right Ear: External ear normal.  Left Ear: External ear normal.  Mouth/Throat: Oropharynx is clear and moist.  Eyes:  Pupils are equal, round, and reactive to light.  Neck: Normal range of motion.  Cardiovascular: Normal rate and regular rhythm.   Pulmonary/Chest: Effort normal and breath sounds normal. No respiratory distress. She has no wheezes. She exhibits no tenderness.  Abdominal: Soft.  Musculoskeletal: Normal range of motion.  Lymphadenopathy:    She has no cervical adenopathy.  Neurological: She is alert.  Skin: Skin is warm and dry.  Nursing note and vitals reviewed.   ED Course  Procedures  DIAGNOSTIC STUDIES: Oxygen Saturation is 100% on RA, normal by my interpretation.    COORDINATION OF CARE: 10:24 PM - Discussed plans to order diagnostic imaging Pt advised of plan for treatment and pt agrees.  Labs Review Labs Reviewed - No data to display  Imaging Review Dg Chest 2 View  05/18/2015   CLINICAL DATA:  Pt c/o cough, generalized chest pain, sob, nasal congestion x 2 days. No known cardiopulmonary history.  EXAM: CHEST  2 VIEW  COMPARISON:  09/08/2013  FINDINGS: The heart size and mediastinal contours are within normal limits. Both lungs are clear. The visualized skeletal structures are unremarkable.  IMPRESSION: No active cardiopulmonary disease.   Electronically Signed   By: Skipper Cliche M.D.   On: 05/18/2015 23:00   I have personally reviewed and evaluated these images and lab results as part of my medical decision-making.   EKG Interpretation None      MDM   Final diagnoses:  URI (upper respiratory infection)  Cough   I personally performed the services described in this documentation, which was scribed in my presence. The recorded information has been reviewed and is accurate.  Junius Creamer, NP 05/18/15 5400  Dorie Rank, MD 05/19/15 [phone]

## 2015-05-18 NOTE — Discharge Instructions (Signed)
Your chest x-ray is normal

## 2015-05-18 NOTE — ED Notes (Signed)
Pt co barking cough that causes chest pain along with nasal congestion, watery eyes, wheezing and ear pain with an "echoing sound in ears"; pt states she took Benadryl, theraflu; robitussin w/o relief; pt states sx make her unable to sleep for 2 days; per pt when she lies on her back, she can feel heaviness in her chest and difficulty breathing; pt states she has had pneumonia before and states "it feels like pneumonia"

## 2015-05-26 ENCOUNTER — Other Ambulatory Visit: Payer: Self-pay

## 2015-06-06 ENCOUNTER — Other Ambulatory Visit: Payer: Self-pay | Admitting: Internal Medicine

## 2015-06-06 DIAGNOSIS — N6320 Unspecified lump in the left breast, unspecified quadrant: Principal | ICD-10-CM

## 2015-06-06 DIAGNOSIS — N6325 Unspecified lump in the left breast, overlapping quadrants: Secondary | ICD-10-CM

## 2015-06-13 ENCOUNTER — Other Ambulatory Visit: Payer: Self-pay

## 2016-01-08 ENCOUNTER — Ambulatory Visit (INDEPENDENT_AMBULATORY_CARE_PROVIDER_SITE_OTHER): Payer: Medicaid Other

## 2016-01-08 ENCOUNTER — Encounter: Payer: Self-pay | Admitting: Sports Medicine

## 2016-01-08 ENCOUNTER — Ambulatory Visit (INDEPENDENT_AMBULATORY_CARE_PROVIDER_SITE_OTHER): Payer: Medicaid Other | Admitting: Sports Medicine

## 2016-01-08 DIAGNOSIS — M2040 Other hammer toe(s) (acquired), unspecified foot: Secondary | ICD-10-CM

## 2016-01-08 DIAGNOSIS — M79672 Pain in left foot: Secondary | ICD-10-CM

## 2016-01-08 DIAGNOSIS — Q828 Other specified congenital malformations of skin: Secondary | ICD-10-CM

## 2016-01-08 DIAGNOSIS — M2042 Other hammer toe(s) (acquired), left foot: Secondary | ICD-10-CM

## 2016-01-08 NOTE — Progress Notes (Signed)
Patient ID: Linda Odom, female   DOB: Aug 28, 1981, 36 y.o.   MRN: [id number] Subjective: Linda Odom is a 35 y.o. female patient who presents to office for evaluation of Left foot pain. Patient complains of progressive pain especially over the last year in the Left foot at the 2-5 toes and at the callus under the bottom of her foot of which she has tried many treatments for including creams, pumice stone, and callus removers. Patient works at a day care and reports that when the callus builds up it is very painful. Patient desires surgery; denies any other pedal complaints.   Admits to past history of possibly breaking the left 2nd toe.   Patient Active Problem List   Diagnosis Date Noted  . Acute appendicitis 11/13/2013  . Acute appendicitis, unspecified acute appendicitis type 11/13/2013    Current Outpatient Prescriptions on File Prior to Visit  Medication Sig Dispense Refill  . clindamycin (CLEOCIN) 300 MG capsule Take 1 capsule (300 mg total) by mouth 3 (three) times daily. 21 capsule 0  . Dextromethorphan-Guaifenesin (DELSYM COUGH/CHEST CONGEST DM) 5-100 MG/5ML LIQD Take 5 mLs by mouth 2 (two) times daily. 120 mL 0  . dicyclomine (BENTYL) 20 MG tablet Take 1 tablet (20 mg total) by mouth every 6 (six) hours as needed for spasms (for abdominal cramping). 20 tablet 0  . ibuprofen (ADVIL,MOTRIN) 800 MG tablet Take 1 tablet (800 mg total) by mouth 3 (three) times daily. 21 tablet 0  . ipratropium (ATROVENT) 0.06 % nasal spray Place 2 sprays into both nostrils 4 (four) times daily. (Patient taking differently: Place 2 sprays into both nostrils 4 (four) times daily as needed for rhinitis. ) 15 mL 1  . omeprazole (PRILOSEC OTC) 20 MG tablet Take 1 tablet (20 mg total) by mouth daily. 28 tablet 1  . ondansetron (ZOFRAN ODT) 8 MG disintegrating tablet Take 1 tablet (8 mg total) by mouth every 8 (eight) hours as needed for nausea or vomiting. 20 tablet 0  . pseudoephedrine (SUDAFED) 120 MG 12 hr  tablet Take 1 tablet (120 mg total) by mouth every 12 (twelve) hours as needed for congestion. 20 tablet 0  . traMADol (ULTRAM) 50 MG tablet Take 50 mg by mouth every 6 (six) hours as needed.     . [DISCONTINUED] promethazine (PHENERGAN) 25 MG tablet Take 1 tablet (25 mg total) by mouth every 6 (six) hours as needed for nausea. 30 tablet 0   No current facility-administered medications on file prior to visit.    No Known Allergies   Past Surgical History  Procedure Laterality Date  . Cholecystectomy    . Tubal ligation  2006  . Laparoscopic appendectomy N/A 11/13/2013    Procedure: LAP ASSISTED PARTIAL CECECTOMY;  Surgeon: Odis Hollingshead, MD;  Location: [facility];  Service: General;  Laterality: N/A;  . Leg sugery  1994   Family History  Problem Relation Age of Onset  . Hypertension Mother   . Diabetes Mother   . Hypertension Maternal Grandmother   . Other Neg Hx    Social History   Social History  . Marital Status: Single    Spouse Name: N/A  . Number of Children: N/A  . Years of Education: N/A   Occupational History  . Not on file.   Social History Main Topics  . Smoking status: Current Some Day Smoker -- 17.00 packs/day  . Smokeless tobacco: Never Used  . Alcohol Use: Yes     Comment: occ  . Drug  Use: No  . Sexual Activity: Yes    Birth Control/ Protection: Surgical   Other Topics Concern  . Not on file   Social History Narrative   Objective:  General: Alert and oriented x3 in no acute distress  Dermatology: Small hyperkeratotic lesion overlying 2-5 PIPJ dorsally on left with greatest involvement at 2nd and 5th toe, + callus with nucleated core consistent with porokeratosis sub met 3 on left. No open lesions bilateral lower extremities, no webspace macerations, no ecchymosis bilateral, all nails x 10 are well manicured.  Vascular: Dorsalis Pedis and Posterior Tibial pedal pulses 2/4, Capillary Fill Time 3 seconds,(+) pedal hair growth bilateral, no edema  bilateral lower extremities, Temperature gradient within normal limits.  Neurology: Johney Maine sensation intact via light touch bilateral, Protective sensation intact  with Semmes Weinstein Monofilament to all pedal sites, Position sense intact, vibratory intact bilateral, Deep tendon reflexes within normal limits bilateral, No babinski sign present bilateral. (- )Tinels sign bilateral.    Musculoskeletal: Semi-flexible hammertoes 2-5 with Mild tenderness with palpation at PIPJ on Left>right and sub met 3 on left. Ankle, Subtalar, Midtarsal, and MTPJ joint range of motion is within normal limits, there is no 1st ray hypermobility noted bilateral, No bunion deformity noted bilateral. No pain with calf compression bilateral.  Strength within normal limits in all groups bilateral.   Xrays  Left Foot    Impression: Consistent with hammertoe        Assessment and Plan: Problem List Items Addressed This Visit    None    Visit Diagnoses    Left foot pain    -  Primary    Relevant Orders    DG Foot Complete Left    Hammertoe, left        Porokeratosis            -Complete examination performed -Xrays reviewed -Discussed treatement options for painful hammertoe and keratosis -Patient opt for surgical management. Consent obtain for Left 2-5 hammertoe with possible kwire and excision of skin lesion, keratosis . Pre and Post op course explained. Risks, benefits, alternatives explained. No guarantees given or implied. Surgical booking slip submitted and provided patient with Surgical packet and info for [facility].  -To DispensedCAM Walker to use post op at [place] patient to take at-least 6 weeks off from work for post op recovery  -Cont with good supportive shoes and softening agents for keratosis -Patient to return to office after surgery or sooner if condition worsens.  Landis Martins, DPM

## 2016-01-08 NOTE — Patient Instructions (Signed)
Pre-Operative Instructions  Congratulations, you have decided to take an important step to improving your quality of life.  You can be assured that the doctors of [facility] will be with you every step of the way.  1. Plan to be at the surgery center/hospital at least 1 (one) hour prior to your scheduled time unless otherwise directed by the surgical center/hospital staff.  You must have a responsible adult accompany you, remain during the surgery and drive you home.  Make sure you have directions to the surgical center/hospital and know how to get there on time. 2. For hospital based surgery you will need to obtain a history and physical form from your family physician within 1 month prior to the date of surgery- we will give you a form for you primary physician.  3. We make every effort to accommodate the date you request for surgery.  There are however, times where surgery dates or times have to be moved.  We will contact you as soon as possible if a change in schedule is required.   4. No Aspirin/Ibuprofen for one week before surgery.  If you are on aspirin, any non-steroidal anti-inflammatory medications (Mobic, Aleve, Ibuprofen) you should stop taking it 7 days prior to your surgery.  You make take Tylenol  For pain prior to surgery.  5. Medications- If you are taking daily heart and blood pressure medications, seizure, reflux, allergy, asthma, anxiety, pain or diabetes medications, make sure the surgery center/hospital is aware before the day of surgery so they may notify you which medications to take or avoid the day of surgery. 6. No food or drink after midnight the night before surgery unless directed otherwise by surgical center/hospital staff. 7. No alcoholic beverages 24 hours prior to surgery.  No smoking 24 hours prior to or 24 hours after surgery. 8. Wear loose pants or shorts- loose enough to fit over bandages, boots, and casts. 9. No slip on shoes, sneakers are best. 10. Bring  your boot with you to the surgery center/hospital.  Also bring crutches or a walker if your physician has prescribed it for you.  If you do not have this equipment, it will be provided for you after surgery. 11. If you have not been contracted by the surgery center/hospital by the day before your surgery, call to confirm the date and time of your surgery. 12. Leave-time from work may vary depending on the type of surgery you have.  Appropriate arrangements should be made prior to surgery with your employer. 13. Prescriptions will be provided immediately following surgery by your doctor.  Have these filled as soon as possible after surgery and take the medication as directed. 14. Remove nail polish on the operative foot. 15. Wash the night before surgery.  The night before surgery wash the foot and leg well with the antibacterial soap provided and water paying special attention to beneath the toenails and in between the toes.  Rinse thoroughly with water and dry well with a towel.  Perform this wash unless told not to do so by your physician.  Enclosed: 1 Ice pack (please put in freezer the night before surgery)   1 Hibiclens skin cleaner   Pre-op Instructions  If you have any questions regarding the instructions, do not hesitate to call our office.  [place]: [address] [place], [place] [address] [phone]  [place]: [address] [place], [place] [address] [phone]  [place]: [address]  [place], [place] [address] [phone]   Dr.   Norman Regal DPM, Dr. Matthew Wagoner DPM, Dr. M. Todd Hyatt DPM, Dr. Shawnique Mariotti DPM 

## 2016-01-16 ENCOUNTER — Telehealth: Payer: Self-pay | Admitting: *Deleted

## 2016-01-16 NOTE — Telephone Encounter (Signed)
I called and left patient a message that I will reschedule surgery from May 15 to June 12.  Call if you have any further questions.  I called and rescheduled surgery with Linda Odom at [facility] from 01/22/2016 to 02/19/2016.

## 2016-01-16 NOTE — Telephone Encounter (Signed)
"  I'm scheduled for a foot surgery appointment on the 15.  I would like to know if I can change that to June 12 after my daughter's graduation.  Please give me a call back.  My surgery appointment is Monday, so I'd like to hear from someone by then."

## 2016-01-17 ENCOUNTER — Telehealth: Payer: Self-pay | Admitting: *Deleted

## 2016-01-17 NOTE — Telephone Encounter (Signed)
"  I have a quick emergency.  My appointment is Monday but I'd like to know if I can reschedule it to June 12.  Give me a call."  I'm calling to let you know that I have already rescheduled your surgery to 02/19/2016.  You had called previously and left a message.  I called and left you a message that I would reschedule it.  "Yes, I spoke to someone at 4pm she told me it had been reschedule.  I just wanted to make sure."

## 2016-01-20 ENCOUNTER — Ambulatory Visit (HOSPITAL_COMMUNITY)
Admission: EM | Admit: 2016-01-20 | Discharge: 2016-01-20 | Disposition: A | Discharge status: Home / Self Care | Payer: Medicaid Other | Attending: Emergency Medicine | Admitting: Emergency Medicine

## 2016-01-20 ENCOUNTER — Encounter (HOSPITAL_COMMUNITY): Payer: Self-pay | Admitting: Emergency Medicine

## 2016-01-20 DIAGNOSIS — R1310 Dysphagia, unspecified: Secondary | ICD-10-CM

## 2016-01-20 MED ORDER — GI COCKTAIL ~~LOC~~
ORAL | Status: AC
  Filled 2016-01-20: qty 30

## 2016-01-20 MED ORDER — GI COCKTAIL ~~LOC~~
30.0000 mL | Freq: Once | ORAL | Status: AC
  Administered 2016-01-20: 30 mL via ORAL

## 2016-01-20 NOTE — ED Provider Notes (Signed)
CSN: [id number]     Arrival date & time 01/20/16  1258 History   First MD Initiated Contact with Patient 01/20/16 1338     Chief Complaint  Patient presents with  . Swallowed Foreign Body   (Consider location/radiation/quality/duration/timing/severity/associated sxs/prior Treatment) HPI History obtained from patient:  Pt presents with the cc of: Painful swallowing Duration of symptoms: Since yesterday Treatment prior to arrival: Tried swallowing bread Context: Eating salmon swallowed an unchewed portion which causes pain in the back of her throat.  Other symptoms include: Pain in her back and radiates to her chest Pain score: 2-3 FAMILY HISTORY: Mother has hypertension diabetes SOCIAL HISTORY: Smoker  Past Medical History  Diagnosis Date  . Breast nodule   . No pertinent past medical history   . Gastric ulcer    Past Surgical History  Procedure Laterality Date  . Cholecystectomy    . Tubal ligation  2006  . Laparoscopic appendectomy N/A 11/13/2013    Procedure: LAP ASSISTED PARTIAL CECECTOMY;  Surgeon: Odis Hollingshead, MD;  Location: [facility];  Service: General;  Laterality: N/A;  . Leg sugery  1994   Family History  Problem Relation Age of Onset  . Hypertension Mother   . Diabetes Mother   . Hypertension Maternal Grandmother   . Other Neg Hx    Social History  Substance Use Topics  . Smoking status: Current Some Day Smoker -- 17.00 packs/day  . Smokeless tobacco: Never Used  . Alcohol Use: Yes     Comment: occ   OB History    Gravida Para Term Preterm AB TAB SAB Ectopic Multiple Living   5 3 3  2  2   3      Review of Systems ROS +'ve hurts to swallow  Denies: HEADACHE, NAUSEA, ABDOMINAL PAIN, CHEST PAIN, CONGESTION, DYSURIA, SHORTNESS OF BREATH  Allergies  Review of patient's allergies indicates no known allergies.  Home Medications   Prior to Admission medications   Medication Sig Start Date End Date Taking? Authorizing Provider  clindamycin (CLEOCIN)  300 MG capsule Take 1 capsule (300 mg total) by mouth 3 (three) times daily. 04/17/15   Billy Fischer, MD  Dextromethorphan-Guaifenesin (DELSYM COUGH/CHEST CONGEST DM) 5-100 MG/5ML LIQD Take 5 mLs by mouth 2 (two) times daily. 05/18/15   Junius Creamer, NP  dicyclomine (BENTYL) 20 MG tablet Take 1 tablet (20 mg total) by mouth every 6 (six) hours as needed for spasms (for abdominal cramping). 12/17/14   Linton Flemings, MD  ibuprofen (ADVIL,MOTRIN) 800 MG tablet Take 1 tablet (800 mg total) by mouth 3 (three) times daily. 01/15/15   Dahlia Bailiff, PA-C  ipratropium (ATROVENT) 0.06 % nasal spray Place 2 sprays into both nostrils 4 (four) times daily. Patient taking differently: Place 2 sprays into both nostrils 4 (four) times daily as needed for rhinitis.  09/08/13   Gregor Hams, MD  omeprazole (PRILOSEC OTC) 20 MG tablet Take 1 tablet (20 mg total) by mouth daily. 08/12/12 11/05/14  Manya Silvas, CNM  ondansetron (ZOFRAN ODT) 8 MG disintegrating tablet Take 1 tablet (8 mg total) by mouth every 8 (eight) hours as needed for nausea or vomiting. 12/17/14   Linton Flemings, MD  pseudoephedrine (SUDAFED) 120 MG 12 hr tablet Take 1 tablet (120 mg total) by mouth every 12 (twelve) hours as needed for congestion. 05/18/15   Junius Creamer, NP  traMADol (ULTRAM) 50 MG tablet Take 50 mg by mouth every 6 (six) hours as needed.     Historical Provider,  MD   Meds Ordered and Administered this Visit   Medications  gi cocktail (Maalox,Lidocaine,Donnatal) (not administered)    BP 111/72 mmHg  Pulse 67  Temp(Src) 98.3 F (36.8 C) (Oral)  Resp 20  SpO2 100%  LMP  (LMP Unknown) No data found.   Physical Exam NURSES NOTES AND VITAL SIGNS REVIEWED. CONSTITUTIONAL: Well developed, well nourished, no acute distress HEENT: normocephalic, atraumatic EYES: Conjunctiva normal NECK:normal ROM, supple, no adenopathy PULMONARY:No respiratory distress, normal effort ABDOMINAL: Soft, ND, NT BS+, No CVAT MUSCULOSKELETAL: Normal ROM of all  extremities,  SKIN: warm and dry without rash PSYCHIATRIC: Mood and affect, behavior are normal  ED Course  Procedures (including critical care time)  Labs Review Labs Reviewed - No data to display  Imaging Review No results found.   Visual Acuity Review  Right Eye Distance:   Left Eye Distance:   Bilateral Distance:    Right Eye Near:   Left Eye Near:    Bilateral Near:     Treatment plan GI cocktail to see if we can alleviate the pain in her throat area and she is able to clear her home life and drink fluids without difficulty here in the UC.  After GI cocktail patient is able to eat solid foods take liquids clear saliva without any difficulty at all.    MDM   1. Odynophagia      Patient is reassured that there are no issues that require transfer to higher level of care at this time or additional tests. Patient is advised to continue home symptomatic treatment. Patient is advised that if there are new or worsening symptoms to attend the emergency department, contact primary care provider, or return to UC. Instructions of care provided discharged home in stable condition.    THIS NOTE WAS GENERATED USING A VOICE RECOGNITION SOFTWARE PROGRAM. ALL REASONABLE EFFORTS  WERE MADE TO PROOFREAD THIS DOCUMENT FOR ACCURACY.  I have verbally reviewed the discharge instructions with the patient. A printed AVS was given to the patient.  All questions were answered prior to discharge.      Konrad Felix, [place] 01/20/16 1431

## 2016-01-20 NOTE — ED Notes (Signed)
Pt reports she ate lunch yest and states she aspirated her a piece food Believes it's still lodged in her esophagus... Pain increases when she burps... Pain will radiate to chest and to back Denies dyspnea A&O x4... No acute distress.

## 2016-01-20 NOTE — Discharge Instructions (Signed)
You are able to clear your secretions and take liquids without difficulty at this time. This is reassuring that there is no blockage in your esophagus. Most likely the food bolus you ate  yesterday scraped your esophagus and causes some pain with swallowing.  If had any time in the future you are not able to clear your secretions, drink liquids, or eat food he may need to see a GI specialist and have an endoscopy done to ensure that your esophagus is patent.  Make sure you to all your food well.

## 2016-01-20 NOTE — ED Notes (Signed)
Pt discharged by frank patrick, pa

## 2016-01-21 ENCOUNTER — Encounter (HOSPITAL_COMMUNITY): Payer: Self-pay | Admitting: *Deleted

## 2016-01-21 ENCOUNTER — Emergency Department (HOSPITAL_COMMUNITY): Payer: Medicaid Other

## 2016-01-21 ENCOUNTER — Emergency Department (HOSPITAL_COMMUNITY)
Admission: EM | Admit: 2016-01-21 | Discharge: 2016-01-21 | Disposition: A | Discharge status: Home / Self Care | Payer: Medicaid Other | Attending: Emergency Medicine | Admitting: Emergency Medicine

## 2016-01-21 DIAGNOSIS — Z792 Long term (current) use of antibiotics: Secondary | ICD-10-CM | POA: Insufficient documentation

## 2016-01-21 DIAGNOSIS — Z79899 Other long term (current) drug therapy: Secondary | ICD-10-CM | POA: Diagnosis not present

## 2016-01-21 DIAGNOSIS — Z8742 Personal history of other diseases of the female genital tract: Secondary | ICD-10-CM | POA: Insufficient documentation

## 2016-01-21 DIAGNOSIS — R1310 Dysphagia, unspecified: Secondary | ICD-10-CM | POA: Diagnosis not present

## 2016-01-21 DIAGNOSIS — F17200 Nicotine dependence, unspecified, uncomplicated: Secondary | ICD-10-CM | POA: Insufficient documentation

## 2016-01-21 DIAGNOSIS — Z8719 Personal history of other diseases of the digestive system: Secondary | ICD-10-CM | POA: Insufficient documentation

## 2016-01-21 DIAGNOSIS — J029 Acute pharyngitis, unspecified: Secondary | ICD-10-CM | POA: Diagnosis present

## 2016-01-21 MED ORDER — PANTOPRAZOLE SODIUM 20 MG PO TBEC: 20.0000 mg | DELAYED_RELEASE_TABLET | Freq: Every day | ORAL | Status: DC

## 2016-01-21 MED ORDER — GI COCKTAIL ~~LOC~~
30.0000 mL | Freq: Once | ORAL | Status: AC
  Administered 2016-01-21: 30 mL via ORAL
  Filled 2016-01-21: qty 30

## 2016-01-21 MED ORDER — ACETAMINOPHEN 325 MG PO TABS
650.0000 mg | ORAL_TABLET | Freq: Once | ORAL | Status: AC
  Administered 2016-01-21: 650 mg via ORAL
  Filled 2016-01-21: qty 2

## 2016-01-21 MED ORDER — IBUPROFEN 200 MG PO TABS
400.0000 mg | ORAL_TABLET | Freq: Once | ORAL | Status: AC
  Administered 2016-01-21: 400 mg via ORAL
  Filled 2016-01-21: qty 2

## 2016-01-21 MED ORDER — PANTOPRAZOLE SODIUM 20 MG PO TBEC
20.0000 mg | DELAYED_RELEASE_TABLET | Freq: Once | ORAL | Status: AC
  Administered 2016-01-21: 20 mg via ORAL
  Filled 2016-01-21: qty 1

## 2016-01-21 NOTE — ED Notes (Signed)
Pt was seen at [facility] for throat pain.  She began feeling pain immediately after she swallowed some food on Friday.  Yesterday she went to UC and states they said she should come here if it gets worse.  Airway patent.

## 2016-01-21 NOTE — Discharge Instructions (Signed)
Please read and follow all provided instructions.  Your diagnoses today include:  1. Odynophagia    Tests performed today include:  Vital signs. See below for your results today.   Medications prescribed:   Take as prescribed   Home care instructions:  Follow any educational materials contained in this packet.  Follow-up instructions: Please follow-up with your primary care provider in the next week for further evaluation of symptoms and treatment   Return instructions:   Please return to the Emergency Department if you do not get better, if you get worse, or new symptoms OR  - Fever (temperature greater than 101.29F)  - Bleeding that does not stop with holding pressure to the area    -Severe pain (please note that you may be more sore the day after your accident)  - Chest Pain  - Difficulty breathing  - Severe nausea or vomiting  - Inability to tolerate food and liquids  - Passing out  - Skin becoming red around your wounds  - Change in mental status (confusion or lethargy)  - New numbness or weakness     Please return if you have any other emergent concerns.  Additional Information:  Your vital signs today were: BP 116/88 mmHg   Pulse 76   Temp(Src) 98.2 F (36.8 C) (Oral)   Resp 20   SpO2 99%   LMP 01/16/2016 If your blood pressure (BP) was elevated above 135/85 this visit, please have this repeated by your doctor within one month. ---------------

## 2016-01-21 NOTE — ED Provider Notes (Signed)
CSN: [id number]     Arrival date & time 01/21/16  [id number] History   First MD Initiated Contact with Patient 01/21/16 [phone]     Chief Complaint  Patient presents with  . Sore Throat   (Consider location/radiation/quality/duration/timing/severity/associated sxs/prior Treatment) HPI 35 y.o. female, presents to the Emergency Department today complaining of sore throat after ingestion of food on Friday. Pt states that she went to UC yesterday for this symptom and told that it was most likely foreign body that scratched her esophagus. Noted to clear secretions and tolerate PO after GI cocktail. Pt is able to tolerate PO and clear secretions. Notes pain with swallowing. States pain is 10/10. Intermittent and throbbing. No fevers. No sore throat. No N/V/D. No headaches. No CP/SOB/ABD pain. She was told to come to the ED if worsening symptoms.  Past Medical History  Diagnosis Date  . Breast nodule   . No pertinent past medical history   . Gastric ulcer    Past Surgical History  Procedure Laterality Date  . Cholecystectomy    . Tubal ligation  2006  . Laparoscopic appendectomy N/A 11/13/2013    Procedure: LAP ASSISTED PARTIAL CECECTOMY;  Surgeon: Odis Hollingshead, MD;  Location: [facility];  Service: General;  Laterality: N/A;  . Leg sugery  1994   Family History  Problem Relation Age of Onset  . Hypertension Mother   . Diabetes Mother   . Hypertension Maternal Grandmother   . Other Neg Hx    Social History  Substance Use Topics  . Smoking status: Current Some Day Smoker -- 17.00 packs/day  . Smokeless tobacco: Never Used  . Alcohol Use: Yes     Comment: occ   OB History    Gravida Para Term Preterm AB TAB SAB Ectopic Multiple Living   5 3 3  2  2   3      Review of Systems ROS reviewed and all are negative for acute change except as noted in the HPI.  Allergies  Review of patient's allergies indicates no known allergies.  Home Medications   Prior to Admission medications    Medication Sig Start Date End Date Taking? Authorizing Provider  clindamycin (CLEOCIN) 300 MG capsule Take 1 capsule (300 mg total) by mouth 3 (three) times daily. 04/17/15   Billy Fischer, MD  Dextromethorphan-Guaifenesin (DELSYM COUGH/CHEST CONGEST DM) 5-100 MG/5ML LIQD Take 5 mLs by mouth 2 (two) times daily. 05/18/15   Junius Creamer, NP  dicyclomine (BENTYL) 20 MG tablet Take 1 tablet (20 mg total) by mouth every 6 (six) hours as needed for spasms (for abdominal cramping). 12/17/14   Linton Flemings, MD  ibuprofen (ADVIL,MOTRIN) 800 MG tablet Take 1 tablet (800 mg total) by mouth 3 (three) times daily. 01/15/15   Dahlia Bailiff, PA-C  ipratropium (ATROVENT) 0.06 % nasal spray Place 2 sprays into both nostrils 4 (four) times daily. Patient taking differently: Place 2 sprays into both nostrils 4 (four) times daily as needed for rhinitis.  09/08/13   Gregor Hams, MD  omeprazole (PRILOSEC OTC) 20 MG tablet Take 1 tablet (20 mg total) by mouth daily. 08/12/12 11/05/14  Manya Silvas, CNM  ondansetron (ZOFRAN ODT) 8 MG disintegrating tablet Take 1 tablet (8 mg total) by mouth every 8 (eight) hours as needed for nausea or vomiting. 12/17/14   Linton Flemings, MD  pseudoephedrine (SUDAFED) 120 MG 12 hr tablet Take 1 tablet (120 mg total) by mouth every 12 (twelve) hours as needed for congestion. 05/18/15  Junius Creamer, NP  traMADol (ULTRAM) 50 MG tablet Take 50 mg by mouth every 6 (six) hours as needed.     Historical Provider, MD   BP 116/88 mmHg  Pulse 76  Temp(Src) 98.2 F (36.8 C) (Oral)  Resp 20  SpO2 99%  LMP 01/16/2016 Physical Exam  Constitutional: She is oriented to person, place, and time. She appears well-developed and well-nourished.  HENT:  Head: Normocephalic and atraumatic.  Mouth/Throat: Uvula is midline and mucous membranes are normal. No oral lesions. No trismus in the jaw. Normal dentition. No dental abscesses, uvula swelling or dental caries. Posterior oropharyngeal erythema present.  No foreign bodies  noted in oropharynx   Eyes: EOM are normal. Pupils are equal, round, and reactive to light.  Neck: Trachea normal and normal range of motion. Neck supple. Normal carotid pulses present. No tracheal tenderness, no spinous process tenderness and no muscular tenderness present. Carotid bruit is not present. No rigidity. No tracheal deviation, no erythema and normal range of motion present.  Slight TTP around jugular notch. Full ROM of neck without difficulty. No masses palpable   Cardiovascular: Normal rate, regular rhythm, normal heart sounds and intact distal pulses.   No murmur heard. Pulmonary/Chest: Effort normal and breath sounds normal. No stridor. No respiratory distress. She has no wheezes. She has no rales. She exhibits no tenderness.  Abdominal: Soft.  Musculoskeletal: Normal range of motion.  Neurological: She is alert and oriented to person, place, and time.  Skin: Skin is warm and dry.  Psychiatric: She has a normal mood and affect. Her behavior is normal. Thought content normal.  Nursing note and vitals reviewed.  ED Course  Procedures (including critical care time) Labs Review Labs Reviewed - No data to display  Imaging Review Dg Neck Soft Tissue  01/21/2016  CLINICAL DATA:  Sharp pain over mid to left neck radiating to back and middle of chest after age eating two days ago. Evaluate for foreign body. EXAM: NECK SOFT TISSUES - 1+ VIEW COMPARISON:  None. FINDINGS: Pharynx and hypopharynx are normal. Epiglottis and subglottic airway are normal. Prevertebral soft tissues are normal. No evidence of ingested radiopaque foreign body. Remaining bones and soft tissues are within normal. IMPRESSION: Negative. Electronically Signed   By: Marin Olp M.D.   On: 01/21/2016 09:49   I have personally reviewed and evaluated these images and lab results as part of my medical decision-making.   EKG Interpretation None      MDM  I have reviewed and evaluated the relevant imaging studies.   I have reviewed the relevant previous healthcare records.I obtained HPI from historian. Patient discussed with supervising physician  ED Course:  Assessment: Pt is a 34yF who presents with throat pain after ingestion of foreign body on Friday. Seen at [facility] yesterday and told it was possible stratch from foreign body. Tolerates PO. She can clear secretions. Full ROM of neck. On exam, pt in NAD. Nontoxic/nonseptic appearing. VSS. Afebrile. Lungs CTA. Heart RRR. Abdomen nontender soft. TTP along jugular notch. Full ROM of neck without difficulty. Pt phonating well. Able to clear secretions. Able to tolerate PO. XR with no foreign bodies. Given protonix in ED with improvement of symptoms. Discussed with supervising physician and most likely foreign body causing scratch on esophagus. Plan is to DC home with PPI and follow up with PCP. At time of discharge, Patient is in no acute distress. Vital Signs are stable. Patient is able to ambulate. Patient able to tolerate PO.  Disposition/Plan:  DC Home Additional Verbal discharge instructions given and discussed with patient.  Pt Instructed to f/u with PCP in the next week for evaluation and treatment of symptoms. Return precautions given Pt acknowledges and agrees with plan  Supervising Physician Virgel Manifold, MD   Final diagnoses:  Odynophagia       Shary Decamp, PA-C 01/21/16 1034  Virgel Manifold, MD 01/21/16 1606

## 2016-01-29 ENCOUNTER — Encounter: Payer: Self-pay | Admitting: Sports Medicine

## 2016-02-14 ENCOUNTER — Telehealth: Payer: Self-pay | Admitting: *Deleted

## 2016-02-14 NOTE — Telephone Encounter (Signed)
"  The nurse informed me that this patient said she needs to reschedule surgery for Monday, June 12th."  I'll give her a call.  I left patient a message to call me regarding appointment scheduled for Monday, June 12.

## 2016-02-19 NOTE — Telephone Encounter (Signed)
I received a call from [place] at [facility]  She stated patient was calling about her surgery that was scheduled for today.  She stated that patient said that she did not cancel her surgery.  She asked if we could do surgery today.  I informed her that they had already changed Dr. Leeanne Rio start time to 9:30.  She will not be able to do it this afternoon because she has patients in the office.  She asked that I call the patient.  I'm calling your in regards to surgery that was supposed to have been today.  "I didn't cancel my surgery!  You all have made a mistake!"  I received a call from the surgical center that you had canceled.  I called you on Wednesday of last week and left a message for you to call me.  "I told that lady that I may have to cancel.  I would call back to reschedule if I needed to.  I did not tell her to take me off the books.  This is you all fault.  I need to talk to Dr. Cannon Kettle or something.  I need to have this done today."  You will not be able to have it done today.  "Why can't I have it done today.  My arrival time wasn't until 2:30 pm!  This is y'alls fault.  I done made arrangements with my work.  I can't go back to work because they already found somebody to work for me.  Y'all need to figure this out!"  I'll see if it can be done tomorrow.  "That's fine with me, as long as it is done this week."  I'll call you back and let you know.  "I want to know something today!"  I'll call you back and let you know if we can do it tomorrow.  I called and spoke to [facility] at surgical center.  I asked if Dr. Cannon Kettle could do surgery tomorrow morning at 7 am.  She stated she would have to ask Aaron Edelman, he's in charge of the [place] rooms.  He may not okay staff to start that early for one case.  I'll check with him and let you know.

## 2016-02-19 NOTE — Telephone Encounter (Signed)
Block

## 2016-02-19 NOTE — Telephone Encounter (Signed)
"  I spoke to one of the nurses she said it was okay to add the case on for tomorrow morning.  Do you know if she needs a block or just general?  It doesn't say on the paperwork."  It says possible block.  She's there, ask her.  "Okay I will see if I can catch her.  I'll call the patient and give her the arrival time once I find out what type of anesthesia she wants to use."  Sounds good, thank you.

## 2016-02-20 ENCOUNTER — Encounter (HOSPITAL_COMMUNITY): Payer: Self-pay | Admitting: Emergency Medicine

## 2016-02-20 ENCOUNTER — Encounter: Payer: Self-pay | Admitting: Sports Medicine

## 2016-02-20 ENCOUNTER — Emergency Department (HOSPITAL_COMMUNITY)
Admission: EM | Admit: 2016-02-20 | Discharge: 2016-02-21 | Disposition: A | Discharge status: Home / Self Care | Payer: Medicaid Other | Attending: Emergency Medicine | Admitting: Emergency Medicine

## 2016-02-20 DIAGNOSIS — Y69 Unspecified misadventure during surgical and medical care: Secondary | ICD-10-CM | POA: Diagnosis not present

## 2016-02-20 DIAGNOSIS — Z79899 Other long term (current) drug therapy: Secondary | ICD-10-CM | POA: Diagnosis not present

## 2016-02-20 DIAGNOSIS — F17200 Nicotine dependence, unspecified, uncomplicated: Secondary | ICD-10-CM | POA: Insufficient documentation

## 2016-02-20 DIAGNOSIS — M2042 Other hammer toe(s) (acquired), left foot: Secondary | ICD-10-CM | POA: Diagnosis not present

## 2016-02-20 DIAGNOSIS — T819XXA Unspecified complication of procedure, initial encounter: Secondary | ICD-10-CM | POA: Insufficient documentation

## 2016-02-20 DIAGNOSIS — D492 Neoplasm of unspecified behavior of bone, soft tissue, and skin: Secondary | ICD-10-CM | POA: Diagnosis not present

## 2016-02-20 DIAGNOSIS — G8918 Other acute postprocedural pain: Secondary | ICD-10-CM

## 2016-02-20 MED ORDER — FENTANYL CITRATE (PF) 100 MCG/2ML IJ SOLN
50.0000 ug | INTRAMUSCULAR | Status: AC | PRN
  Administered 2016-02-20 – 2016-02-21 (×2): 50 ug via NASAL
  Filled 2016-02-20 (×2): qty 2

## 2016-02-20 NOTE — ED Notes (Signed)
Bed: [id number] Expected date:  Expected time:  Means of arrival:  Comments: 35y o F post op pain

## 2016-02-20 NOTE — ED Notes (Signed)
Patient presents from home via EMS for foot pain. Surgery this morning for same, reports no relief of pain with hydrocodone.  Last VS: 127/74, 75hr, 100%ra

## 2016-02-21 ENCOUNTER — Telehealth: Payer: Self-pay | Admitting: Sports Medicine

## 2016-02-21 ENCOUNTER — Telehealth: Payer: Self-pay | Admitting: *Deleted

## 2016-02-21 DIAGNOSIS — G8918 Other acute postprocedural pain: Secondary | ICD-10-CM

## 2016-02-21 MED ORDER — HYDROMORPHONE HCL 4 MG PO TABS: 4.0000 mg | ORAL_TABLET | ORAL | Status: DC | PRN

## 2016-02-21 MED ORDER — HYDROMORPHONE HCL 1 MG/ML IJ SOLN
1.0000 mg | Freq: Once | INTRAMUSCULAR | Status: AC
  Administered 2016-02-21: 1 mg via INTRAVENOUS
  Filled 2016-02-21: qty 1

## 2016-02-21 MED ORDER — HYDROMORPHONE HCL 4 MG PO TABS
4.0000 mg | ORAL_TABLET | ORAL | Status: DC | PRN
Start: 2016-02-21 — End: 2016-02-26

## 2016-02-21 NOTE — Telephone Encounter (Addendum)
-----   Message from Landis Martins, [place] sent at 02/21/2016  6:20 AM EDT ----- Regarding: Pain Medication Can you have a prescription ready for patient for Diluadid 4mg  i tab po q 4h for mod to severe pain, qty 60, no refills. Patient is post op and will be sending her dad or brother by to get it Thanks Dr. Cannon Kettle.02/21/2016-I informed pt Dr. Cannon Kettle had ordered a different pain medication, and it was ready to be picked up.  Pt sounded groggy, but answered appropriately to questions, she did stop talking for 2 minutes.  I called her name loudly and she answered, I asked if she had any questions, she said no.  I told her I would call again later when she was more awake. Post op call-Pt states she feels much better, and is resting with the boot off but put it on when up and walking.  I instructed pt to leave the boot on to walk and can open up to rest, leave the dressing in place clean and dry and not to be up on the foot or dangling more than 15 min/hr and to call with concerns.  Pt states understanding. 04/10/2016-Pt states her foot is in pain and swelling. I asked pt if she had increased her activity level lately and if any accident.  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  I told pt I would transfer her to the schedulers and get her an earlier appt. 04/11/2016-Pt states she would like pain medicine to get her to the appt 04/15/2016.  I told pt I would refill the Dilaudid, and she needed to talk to Dr. Cannon Kettle 04/15/2016 about her pain medications. Pt states she will pick up before 5:00pm today. 04/30/2016-Pt request refill of Dilaudid. Left message for pt to pick up rx in the [place] office. 05/02/2016-Pt states no one called re her pain medication.  I told pt I left a message to pick up the rx in the [place] office on 04/30/2016. 06/10/2016-Pt is requesting pain medication. Informed pt of Dr. Leeanne Rio orders. Pt said,  "All right, all right."

## 2016-02-21 NOTE — Telephone Encounter (Signed)
Thank you for the FYI. She sounded more alert when I spoke with her at [place]. Hopefully you caught her mid-nap or while sleeping; please call back later to check on her.  Thanks Dr. Cannon Kettle

## 2016-02-21 NOTE — Discharge Instructions (Signed)
Pain Relief Preoperatively and Postoperatively  If you have questions, problems, or concerns about the pain that you may feel after surgery, let your health care provider know. Patients have the right to assessment and management of pain. Severe pain after surgery--and the fear or anxiety associated with that pain--may cause extreme discomfort that:  · Prevents sleep.  · Decreases the ability to breathe deeply and to cough. This can result in pneumonia or other upper airway infections.  · Causes the heart to beat more quickly and the blood pressure to be higher.  · Increases the risk for constipation and bloating.  · Decreases the ability of wounds to heal.  · May result in depression, increased anxiety, and feelings of helplessness.  Relieving pain before surgery (preoperatively) is also important because it lessens pain that you have after surgery (postoperatively). Patients who receive pain relief both before and after surgery experience greater pain relief than those who receive pain relief only after surgery. Let your health care provider know if you are having uncontrolled pain. This is very important. Pain after surgery is more difficult to manage if it is severe, so receiving prompt and adequate treatment of acute pain is necessary. If you become constipated after taking pain medicine, drink more liquids if you can. Your health care provider may have you take a mild laxative.  PAIN CONTROL METHODS  Your health care providers follow policies and procedures about the management of your pain. These guidelines should be explained to you before surgery. Plans for pain control after surgery must be decided upon by you and your health care provider and put into use with your full understanding and agreement. Do not be afraid to ask questions about the care that you are receiving.  Your health care providers will attempt to control your pain in various ways, and these methods may be used together (multimodal  analgesia). Using this approach has many benefits for you, including being able to eat, move around, and leave the hospital sooner.  As-Needed Pain Control  · You may be given pain medicine through an IV tube or as a pill or liquid that you can swallow. Let your health care provider know when you are having pain, and he or she will give you the pain medicine that is ordered for you.  IV Patient-Controlled Analgesia (PCA) Pump  · You can receive your pain medicine through an IV tube that goes into one of your veins. You can control the amount of pain medicine that you get. The pain medicine is controlled by a pump. When you push the button that is hooked up to this pump, you receive a specific amount of pain medicine. This button should be pushed only by you or by someone who is specifically assigned by you to do so. It is set up to keep you from accidentally giving yourself too much pain medicine. You will be able to start using your pain pump in the recovery room after your surgery. This method can be helpful for most types of surgery.  · Tell your health care provider:    If you are having too much pain.    If you are feeling too sleepy or nauseous.  Continuous Epidural Pain Control  · A thin, soft tube (catheter) is put into your back, outside the outer layer of your spinal cord. Pain medicine flows through the catheter to lessen pain in areas of your body that are below the level of catheter placement. Continuous epidural pain control may work best for you   if you are having surgery on your abdomen, hip area, or legs. The epidural catheter is usually put into your back shortly before surgery. It is left in until you can eat, take medicine by mouth, pass urine, and have a bowel movement.  · Giving pain medicine through the epidural catheter may help you to heal more quickly because you can do these things sooner:    Regain normal bowel and bladder function.    Return to eating.    Get up and walk.  Medicine That  Numbs the Area (Local Anesthetic)  You may be given pain medicine:  · As an injection near the area of the pain (local infiltration).  · As an injection near the nerve that controls the sensation to a specific part of your body (peripheral nerve block).  · In your spine to block pain (spinal block).  · Through a local anesthetic reservoir pump. If your surgeon or anesthesiologist selects this option as a part of your pain control, one or more thin, soft tubes will be inserted into your incision site(s) at the end of surgery. These tubes will be connected to a device that is filled with a non-narcotic pain medicine. This medicine gradually empties into your incision site over the next several days. Usually, after all of the medicine is used, your health care provider will remove the tubes and throw away the device.  Opioids  · Moderate to moderately severe acute pain after surgery may respond to opioids. Opioids are narcotic pain medicine. Opioids are often combined with non-narcotic medicines to improve pain relief, lower the risk of side effects, and reduce the chance of addiction.  · If you follow your health care provider's directions about taking opioids and you do not have a history of substance abuse, your risk of becoming addicted is very small. To prevent addiction, opioids are given for short periods of time in careful doses.  Other Methods of Pain Control  · Steroids.  · Physical therapy.  · Heat and cold therapy.  · Compression, such as wrapping an elastic bandage around the area of the pain.  · Massage.     This information is not intended to replace advice given to you by your health care provider. Make sure you discuss any questions you have with your health care provider.     Document Released: 11/16/2002 Document Revised: 09/16/2014 Document Reviewed: 11/20/2010  Elsevier Interactive Patient Education ©2016 [employer]

## 2016-02-21 NOTE — ED Notes (Signed)
PA-C at bedside 

## 2016-02-21 NOTE — Telephone Encounter (Signed)
Post op check. Patient states that she is in pain and that she is icing, elevating, taking Norco with no relief. Rx for Diluadid 4mg  will be available for patient's dad or brother to pick up to use in place of Norco with motrin in between doses as instructed. -Dr. Cannon Kettle

## 2016-02-25 NOTE — ED Provider Notes (Signed)
CSN: [id number]     Arrival date & time 02/20/16  2316 History   First MD Initiated Contact with Patient 02/21/16 0139     Chief Complaint  Patient presents with  . Post-op Problem     (Consider location/radiation/quality/duration/timing/severity/associated sxs/prior Treatment) HPI Comments: The patient presents to the ED with complaint of severe left foot pain since her operative anesthesia wore off from procedure this morning. She was given hydrocodone for home pain relief and states it is not helping. No fever. She has not injured the foot. No complaint of swelling of the extremity.   The history is provided by the patient. No language interpreter was used.    Past Medical History  Diagnosis Date  . Breast nodule   . No pertinent past medical history   . Gastric ulcer    Past Surgical History  Procedure Laterality Date  . Cholecystectomy    . Tubal ligation  2006  . Laparoscopic appendectomy N/A 11/13/2013    Procedure: LAP ASSISTED PARTIAL CECECTOMY;  Surgeon: Odis Hollingshead, MD;  Location: [facility];  Service: General;  Laterality: N/A;  . Leg sugery  1994   Family History  Problem Relation Age of Onset  . Hypertension Mother   . Diabetes Mother   . Hypertension Maternal Grandmother   . Other Neg Hx    Social History  Substance Use Topics  . Smoking status: Current Some Day Smoker -- 17.00 packs/day  . Smokeless tobacco: Never Used  . Alcohol Use: Yes     Comment: occ   OB History    Gravida Para Term Preterm AB TAB SAB Ectopic Multiple Living   5 3 3  2  2   3      Review of Systems  Constitutional: Negative for fever.  Musculoskeletal:       C/o left foot pain  Skin: Negative for color change.  Neurological: Negative for numbness.      Allergies  Review of patient's allergies indicates no known allergies.  Home Medications   Prior to Admission medications   Medication Sig Start Date End Date Taking? Authorizing Provider  HYDROmorphone (DILAUDID) 4  MG tablet Take 1 tablet (4 mg total) by mouth every 4 (four) hours as needed for moderate pain or severe pain. 02/21/16   Landis Martins, DPM  omeprazole (PRILOSEC OTC) 20 MG tablet Take 1 tablet (20 mg total) by mouth daily. 08/12/12 11/05/14  Manya Silvas, CNM  pantoprazole (PROTONIX) 20 MG tablet Take 1 tablet (20 mg total) by mouth daily. 01/21/16   Shary Decamp, PA-C   BP 120/84 mmHg  Pulse 80  Temp(Src) 97.7 F (36.5 C) (Oral)  Resp 20  SpO2 100%  LMP 01/16/2016 Physical Exam  Constitutional: She is oriented to person, place, and time. She appears well-developed and well-nourished. No distress.  Pulmonary/Chest: Effort normal.  Musculoskeletal:  Left foot in bandages placed post-operatively. No swelling or discoloration visualized. Calf nontender.   Neurological: She is alert and oriented to person, place, and time.  Skin: Skin is warm and dry. No erythema.    ED Course  Procedures (including critical care time) Labs Review Labs Reviewed - No data to display  Imaging Review No results found. I have personally reviewed and evaluated these images and lab results as part of my medical decision-making.   EKG Interpretation None      MDM   Final diagnoses:  Post-op pain    The patient presents with increased pain after foot surgery this morning.  No concerning findings on PE - no redness or swelling of exposed LE. VSS, afebrile. IV dilaudid provided x 1 with pain relief. The patient is encouraged to continue hydrocodone on a regular schedule to maintain controlled pain. She is comfortable with discharge home.     Charlann Lange, PA-C 02/25/16 [id number]  April Palumbo, MD 03/17/16 2330

## 2016-02-26 ENCOUNTER — Ambulatory Visit (INDEPENDENT_AMBULATORY_CARE_PROVIDER_SITE_OTHER): Payer: Medicaid Other

## 2016-02-26 ENCOUNTER — Encounter: Payer: Self-pay | Admitting: Sports Medicine

## 2016-02-26 ENCOUNTER — Ambulatory Visit (INDEPENDENT_AMBULATORY_CARE_PROVIDER_SITE_OTHER): Payer: Medicaid Other | Admitting: Sports Medicine

## 2016-02-26 DIAGNOSIS — G8918 Other acute postprocedural pain: Secondary | ICD-10-CM

## 2016-02-26 DIAGNOSIS — M79672 Pain in left foot: Secondary | ICD-10-CM

## 2016-02-26 DIAGNOSIS — M2042 Other hammer toe(s) (acquired), left foot: Secondary | ICD-10-CM | POA: Diagnosis not present

## 2016-02-26 DIAGNOSIS — Z9889 Other specified postprocedural states: Secondary | ICD-10-CM

## 2016-02-26 MED ORDER — HYDROMORPHONE HCL 4 MG PO TABS: 4.0000 mg | ORAL_TABLET | ORAL | Status: DC | PRN

## 2016-02-26 NOTE — Progress Notes (Signed)
Patient ID: Linda Odom, female   DOB: October 20, 1980, 35 y.o.   MRN: [id number] Subjective: Linda Odom is a 35 y.o. female patient seen today in office for POV #1 (DOS 02-20-16), S/P Left hammertoe 2-5 repair and excision of lesion plantar surface. Patient admits to pain at surgical site, denies calf pain, denies headache, chest pain, shortness of breath, nausea, vomiting, fever, or chills. Patient states that she has been walking around and doing a lot; not totally staying off of her foot as advised. No other issues noted.   Patient Active Problem List   Diagnosis Date Noted  . Acute appendicitis 11/13/2013  . Acute appendicitis, unspecified acute appendicitis type 11/13/2013    Current Outpatient Prescriptions on File Prior to Visit  Medication Sig Dispense Refill  . omeprazole (PRILOSEC OTC) 20 MG tablet Take 1 tablet (20 mg total) by mouth daily. 28 tablet 1  . pantoprazole (PROTONIX) 20 MG tablet Take 1 tablet (20 mg total) by mouth daily. 20 tablet 0  . [DISCONTINUED] promethazine (PHENERGAN) 25 MG tablet Take 1 tablet (25 mg total) by mouth every 6 (six) hours as needed for nausea. 30 tablet 0   No current facility-administered medications on file prior to visit.    No Known Allergies  Objective: There were no vitals filed for this visit.  General: No acute distress, AAOx3  Left foot: Kwires and Sutures intact with no gapping or dehiscence at surgical sites, mild swelling to left forefoot, no erythema, no warmth, no drainage, no signs of infection noted, Capillary fill time <3 seconds in all digits, gross sensation present via light touch to left foot. No pain but guarding with range of motion left foot.  No pain with calf compression.   Post Op Xray, Left foot:Kwires intact in excellent alignment and position. No other acute findings. Soft tissue swelling within normal limits for post op status.   Path, intra op [place]  Assessment and Plan:  Problem List Items Addressed This  Visit    None    Visit Diagnoses    Hammertoe, left    -  Primary    Relevant Orders    DG Foot Complete Left    Left foot pain        Relevant Orders    DG Foot Complete Left    Post-op pain        Relevant Medications    HYDROmorphone (DILAUDID) 4 MG tablet    Status post left foot surgery           -Patient seen and evaluated -Applied dry sterile dressing to surgical site left foot secured with ACE wrap and stockinet  -Advised patient to make sure to keep dressings clean, dry, and intact to left surgical site, removing the ACE as needed  -Advised patient to continue with CAM boot on left  foot   -Advised patient to limit activity to necessity  -Advised patient to ice and elevate as necessary  -Refilled dilaudid for patient at today's visit -Advised continue with no work for 5 additional weeks for a total of 6 weeks no work during her post op state -Will plan for suture removal at next office visit. In the meantime, patient to call office if any issues or problems arise.   Landis Martins, DPM

## 2016-03-04 ENCOUNTER — Encounter: Payer: Self-pay | Admitting: Sports Medicine

## 2016-03-04 ENCOUNTER — Ambulatory Visit (INDEPENDENT_AMBULATORY_CARE_PROVIDER_SITE_OTHER): Payer: Medicaid Other | Admitting: Sports Medicine

## 2016-03-04 DIAGNOSIS — Z9889 Other specified postprocedural states: Secondary | ICD-10-CM

## 2016-03-04 DIAGNOSIS — M79672 Pain in left foot: Secondary | ICD-10-CM

## 2016-03-04 NOTE — Progress Notes (Signed)
Patient ID: Linda Odom, female   DOB: 1981-05-26, 35 y.o.   MRN: [id number]   Subjective: Linda Odom is a 35 y.o. female patient seen today in office for POV #2 (DOS 02-20-16), S/P Left hammertoe 2-5 repair and excision of lesion plantar surface. Patient admits to pain at surgical site, denies calf pain, denies headache, chest pain, shortness of breath, nausea, vomiting, fever, or chills. Patient states that she is trying to do better with staying off her foot especially because she does not want to hit one of her pins. No other issues noted.   Patient Active Problem List   Diagnosis Date Noted  . Acute appendicitis 11/13/2013  . Acute appendicitis, unspecified acute appendicitis type 11/13/2013    Current Outpatient Prescriptions on File Prior to Visit  Medication Sig Dispense Refill  . HYDROmorphone (DILAUDID) 4 MG tablet Take 1 tablet (4 mg total) by mouth every 4 (four) hours as needed for moderate pain or severe pain. 60 tablet 0  . omeprazole (PRILOSEC OTC) 20 MG tablet Take 1 tablet (20 mg total) by mouth daily. 28 tablet 1  . pantoprazole (PROTONIX) 20 MG tablet Take 1 tablet (20 mg total) by mouth daily. 20 tablet 0  . [DISCONTINUED] promethazine (PHENERGAN) 25 MG tablet Take 1 tablet (25 mg total) by mouth every 6 (six) hours as needed for nausea. 30 tablet 0   No current facility-administered medications on file prior to visit.    No Known Allergies  Objective: There were no vitals filed for this visit.  General: No acute distress, AAOx3  Left foot: Kwires and Sutures intact with no gapping or dehiscence at surgical sites, mild swelling to left forefoot, no erythema, no warmth, no drainage, no signs of infection noted, Capillary fill time <3 seconds in all digits, gross sensation present via light touch to left foot. No pain but guarding with range of motion left foot.  No pain with calf compression.   Assessment and Plan:  Problem List Items Addressed This Visit    None     Visit Diagnoses    Status post left foot surgery    -  Primary    Left foot pain           -Patient seen and evaluated - Dorsal sutures removed, Applied dry sterile dressing to surgical site left foot secured with ACE wrap and stockinet  -Advised patient to make sure to keep dressings clean, dry, and intact to left surgical site, removing the ACE as needed  -Advised patient to continue with CAM boot on left  foot   -Advised patient to limit activity to necessity  -Advised patient to ice and elevate as necessary  -Continue with dilaudid for pain -Advised continue with no work for 4 additional weeks for a total of 6 weeks no work during her post op state -Will plan to finish suture removal plantarly and take xray to determine if pins can be removed at next office visit. In the meantime, patient to call office if any issues or problems arise.   Landis Martins, DPM

## 2016-03-11 ENCOUNTER — Encounter: Payer: Self-pay | Admitting: Sports Medicine

## 2016-03-11 DIAGNOSIS — M79672 Pain in left foot: Secondary | ICD-10-CM

## 2016-03-18 ENCOUNTER — Ambulatory Visit (INDEPENDENT_AMBULATORY_CARE_PROVIDER_SITE_OTHER): Payer: Medicaid Other | Admitting: Sports Medicine

## 2016-03-18 ENCOUNTER — Ambulatory Visit (INDEPENDENT_AMBULATORY_CARE_PROVIDER_SITE_OTHER): Payer: Medicaid Other

## 2016-03-18 ENCOUNTER — Encounter: Payer: Self-pay | Admitting: Sports Medicine

## 2016-03-18 DIAGNOSIS — Z9889 Other specified postprocedural states: Secondary | ICD-10-CM

## 2016-03-18 DIAGNOSIS — M2042 Other hammer toe(s) (acquired), left foot: Secondary | ICD-10-CM | POA: Diagnosis not present

## 2016-03-18 DIAGNOSIS — M79672 Pain in left foot: Secondary | ICD-10-CM

## 2016-03-18 NOTE — Progress Notes (Signed)
Patient ID: Linda Odom, female   DOB: [birth year], 35 y.o.   MRN: [id number]  Subjective: Linda Odom is a 35 y.o. female patient seen today in office for POV #3 (DOS 02-20-16), S/P Left hammertoe 2-5 repair and excision of lesion plantar surface. Patient admits to pain at surgical site, denies calf pain, denies headache, chest pain, shortness of breath, nausea, vomiting, fever, or chills. Patient states that she is trying to do better with staying off her foot especially because she does not want to hit one of her pins and because of the pain. No other issues noted.   Patient Active Problem List   Diagnosis Date Noted  . Acute appendicitis 11/13/2013  . Acute appendicitis, unspecified acute appendicitis type 11/13/2013    Current Outpatient Prescriptions on File Prior to Visit  Medication Sig Dispense Refill  . HYDROmorphone (DILAUDID) 4 MG tablet Take 1 tablet (4 mg total) by mouth every 4 (four) hours as needed for moderate pain or severe pain. 60 tablet 0  . omeprazole (PRILOSEC OTC) 20 MG tablet Take 1 tablet (20 mg total) by mouth daily. 28 tablet 1  . pantoprazole (PROTONIX) 20 MG tablet Take 1 tablet (20 mg total) by mouth daily. 20 tablet 0  . [DISCONTINUED] promethazine (PHENERGAN) 25 MG tablet Take 1 tablet (25 mg total) by mouth every 6 (six) hours as needed for nausea. 30 tablet 0   No current facility-administered medications on file prior to visit.    No Known Allergies  Objective: There were no vitals filed for this visit.  General: No acute distress, AAOx3  Left foot: Kwires and Sutures intact plantarly with no gapping or dehiscence at surgical sites, mild swelling to left forefoot, no erythema, no warmth, no drainage, no signs of infection noted, Capillary fill time <3 seconds in all digits, gross sensation present via light touch to left foot. Minimal pain and guarding with range of motion left foot.  No pain with calf compression.   Xray left foot- kwires intact with  mild loosening noted with toes is good rectus alignment. No other acute findings.  Assessment and Plan:  Problem List Items Addressed This Visit    None    Visit Diagnoses    Status post left foot surgery    -  Primary    Relevant Orders    DG Foot Complete Left    Hammertoe, left        Relevant Orders    DG Foot Complete Left    Left foot pain           -Patient seen and evaluated - Kwires and platnar sutures removed, Applied dry sterile dressing to surgical site left foot secured with ACE wrap and stockinet  -Tomorrow patient may shower as normal and allow steri-strips to fall off on there own at tips of toes -Advised patient to transition from CAM boot to normal shoe as tolerated -Advised patient to limit activity to tolerance -Advised patient to ice and elevate as necessary  -Continue with dilaudid as needed for pain -Advised continue with no work for 4 additional weeks for a total of 8 weeks during her post op state -Return in 3 weeks and will determine return to work and if patient can get pedicures at next visit. In the meantime, patient to call office if any issues or problems arise.   Landis Martins, DPM

## 2016-04-09 ENCOUNTER — Encounter: Payer: Medicaid Other | Admitting: Sports Medicine

## 2016-04-11 ENCOUNTER — Telehealth: Payer: Self-pay | Admitting: *Deleted

## 2016-04-11 MED ORDER — HYDROMORPHONE HCL 4 MG PO TABS: 4.0000 mg | ORAL_TABLET | ORAL | 0 refills | Status: DC | PRN

## 2016-04-11 NOTE — Telephone Encounter (Signed)
Entered in error

## 2016-04-15 ENCOUNTER — Ambulatory Visit (INDEPENDENT_AMBULATORY_CARE_PROVIDER_SITE_OTHER): Payer: Medicaid Other | Admitting: Sports Medicine

## 2016-04-15 ENCOUNTER — Encounter: Payer: Self-pay | Admitting: Sports Medicine

## 2016-04-15 DIAGNOSIS — G8918 Other acute postprocedural pain: Secondary | ICD-10-CM

## 2016-04-15 MED ORDER — HYDROMORPHONE HCL 4 MG PO TABS: 4.0000 mg | ORAL_TABLET | ORAL | 0 refills | Status: DC | PRN

## 2016-04-15 NOTE — Progress Notes (Signed)
Patient ID: Linda Odom, female   DOB: 1981-05-14, 35 y.o.   MRN: [id number]  Subjective: Linda Odom is a 35 y.o. female patient seen today in office for POV #4 (DOS 02-20-16), S/P Left hammertoe 2-5 repair and excision of lesion plantar surface. Patient admits to pain at surgical site especially since she has increased her activity and is starting to exercise, denies calf pain, denies headache, chest pain, shortness of breath, nausea, vomiting, fever, or chills. No other issues noted.   Patient Active Problem List   Diagnosis Date Noted  . Acute appendicitis 11/13/2013  . Acute appendicitis, unspecified acute appendicitis type 11/13/2013    Current Outpatient Prescriptions on File Prior to Visit  Medication Sig Dispense Refill  . HYDROmorphone (DILAUDID) 4 MG tablet Take 1 tablet (4 mg total) by mouth every 4 (four) hours as needed for severe pain. 30 tablet 0  . omeprazole (PRILOSEC OTC) 20 MG tablet Take 1 tablet (20 mg total) by mouth daily. 28 tablet 1  . pantoprazole (PROTONIX) 20 MG tablet Take 1 tablet (20 mg total) by mouth daily. 20 tablet 0  . [DISCONTINUED] promethazine (PHENERGAN) 25 MG tablet Take 1 tablet (25 mg total) by mouth every 6 (six) hours as needed for nausea. 30 tablet 0   No current facility-administered medications on file prior to visit.     No Known Allergies  Objective: There were no vitals filed for this visit.  General: No acute distress, AAOx3  Left foot: Surgical sites well healed, mild scar, mild swelling to left forefoot, no erythema, no warmth, no drainage, no signs of infection noted, Capillary fill time <3 seconds in all digits, gross sensation present via light touch to left foot. Minimal pain and guarding with range of motion left foot.  No pain with calf compression.   Assessment and Plan:  Problem List Items Addressed This Visit    None    Visit Diagnoses    Post-op pain       Relevant Medications   HYDROmorphone (DILAUDID) 4 MG tablet       -Patient seen and evaluated - Recommend range of motion and scar cream/gels -Advised patient to limit activity to tolerance with good supportive shoes -Advised patient to ice and elevate as necessary  -Continue with dilaudid as needed for pain; Refill provided -Advised continue with no work for 4 additional weeks for a total of 12 weeks during her post op state -Return in 4 weeks for follow up exam and xrays.  In the meantime, patient to call office if any issues or problems arise.   Landis Martins, DPM

## 2016-04-23 ENCOUNTER — Encounter: Payer: Medicaid Other | Admitting: Sports Medicine

## 2016-04-30 MED ORDER — HYDROMORPHONE HCL 4 MG PO TABS: 4.0000 mg | ORAL_TABLET | ORAL | 0 refills | Status: DC | PRN

## 2016-05-14 ENCOUNTER — Encounter: Payer: Self-pay | Admitting: Sports Medicine

## 2016-05-14 ENCOUNTER — Ambulatory Visit (INDEPENDENT_AMBULATORY_CARE_PROVIDER_SITE_OTHER): Payer: Medicaid Other | Admitting: Sports Medicine

## 2016-05-14 ENCOUNTER — Ambulatory Visit (INDEPENDENT_AMBULATORY_CARE_PROVIDER_SITE_OTHER): Payer: Medicaid Other

## 2016-05-14 DIAGNOSIS — M2042 Other hammer toe(s) (acquired), left foot: Secondary | ICD-10-CM

## 2016-05-14 DIAGNOSIS — M79672 Pain in left foot: Secondary | ICD-10-CM

## 2016-05-14 DIAGNOSIS — Z9889 Other specified postprocedural states: Secondary | ICD-10-CM

## 2016-05-14 MED ORDER — HYDROMORPHONE HCL 4 MG PO TABS: 4.0000 mg | ORAL_TABLET | ORAL | 0 refills | Status: DC | PRN

## 2016-05-14 NOTE — Progress Notes (Signed)
Patient ID: Linda Odom, female   DOB: 1980-11-07, 35 y.o.   MRN: [id number]  Subjective: Linda Odom is a 35 y.o. female patient seen today in office for POV #5 (DOS 02-20-16), S/P Left hammertoe 2-5 repair and excision of lesion plantar surface. Patient admits to decreased pain at surgical site, more knee pain and stiffness, denies calf pain, denies headache, chest pain, shortness of breath, nausea, vomiting, fever, or chills. No other issues noted.   Patient Active Problem List   Diagnosis Date Noted  . Acute appendicitis 11/13/2013  . Acute appendicitis, unspecified acute appendicitis type 11/13/2013    Current Outpatient Prescriptions on File Prior to Visit  Medication Sig Dispense Refill  . omeprazole (PRILOSEC OTC) 20 MG tablet Take 1 tablet (20 mg total) by mouth daily. 28 tablet 1  . pantoprazole (PROTONIX) 20 MG tablet Take 1 tablet (20 mg total) by mouth daily. 20 tablet 0  . [DISCONTINUED] promethazine (PHENERGAN) 25 MG tablet Take 1 tablet (25 mg total) by mouth every 6 (six) hours as needed for nausea. 30 tablet 0   No current facility-administered medications on file prior to visit.     No Known Allergies  Objective: There were no vitals filed for this visit.  General: No acute distress, AAOx3  Left foot: Surgical sites well healed, mild scar, mild swelling to left forefoot, no erythema, no warmth, no drainage, no signs of infection noted, Capillary fill time <3 seconds in all digits, gross sensation present via light touch to left foot. Minimal pain and guarding with range of motion left foot.  No pain with calf compression.   Xray Left foot: Surgical sites well healed, no acute findings.   Assessment and Plan:  Problem List Items Addressed This Visit    None    Visit Diagnoses    Left foot pain    -  Primary   Relevant Orders   DG Foot Complete Left   Status post left foot surgery       Hammertoe, left          -Patient seen and evaluated -Xrays  reviewed -Recommend continue with range of motion and scar cream/gels -Rx PT -Advised patient to limit activity to tolerance with good supportive shoes -Advised patient to ice and elevate as necessary  -Continue with dilaudid as needed for pain; Refill provided -Resume work with no restrictions  -Return in 12 weeks for final post op check  In the meantime, patient to call office if any issues or problems arise.   Landis Martins, DPM

## 2016-05-28 ENCOUNTER — Other Ambulatory Visit: Payer: Self-pay

## 2016-05-28 ENCOUNTER — Telehealth: Payer: Self-pay

## 2016-05-28 DIAGNOSIS — Z9889 Other specified postprocedural states: Secondary | ICD-10-CM

## 2016-05-28 DIAGNOSIS — M2042 Other hammer toe(s) (acquired), left foot: Secondary | ICD-10-CM

## 2016-05-28 MED ORDER — HYDROMORPHONE HCL 4 MG PO TABS
4.0000 mg | ORAL_TABLET | ORAL | 0 refills | Status: DC | PRN
Start: 2016-05-28 — End: 2017-09-10

## 2016-05-28 NOTE — Telephone Encounter (Signed)
Pt called wanting a Rx for pain medication, states her toe is still hurting

## 2016-05-28 NOTE — Telephone Encounter (Signed)
We can refill her Dilaudid. This will be her last refill.  Thanks Dr. Cannon Kettle

## 2016-06-04 ENCOUNTER — Encounter: Payer: Self-pay | Admitting: *Deleted

## 2016-06-06 NOTE — Progress Notes (Signed)
DOS 06.13.2017 Left 2nd, 3rd, 4th, 5th Hammertoe Repair with Possible K-Wire and Excision of Skin Lesion Bottom of Left Foot

## 2016-06-10 NOTE — Telephone Encounter (Signed)
We can not give her any more Pain medication. By this time she should not require this strong type of pain medication. She is out of her post op global. The only types of pain relievers I can offer at this time is OTC Motrin or Aleve, topical pain cream, or injections.  -Dr. Cannon Kettle

## 2016-08-13 ENCOUNTER — Ambulatory Visit: Payer: Medicaid Other | Admitting: Sports Medicine

## 2017-02-08 ENCOUNTER — Telehealth (HOSPITAL_COMMUNITY): Payer: Self-pay | Admitting: *Deleted

## 2017-02-08 ENCOUNTER — Ambulatory Visit (HOSPITAL_COMMUNITY)
Admission: EM | Admit: 2017-02-08 | Discharge: 2017-02-08 | Disposition: A | Discharge status: Home / Self Care | Payer: Medicaid Other | Attending: Internal Medicine | Admitting: Internal Medicine

## 2017-02-08 ENCOUNTER — Encounter (HOSPITAL_COMMUNITY): Payer: Self-pay | Admitting: Emergency Medicine

## 2017-02-08 DIAGNOSIS — R05 Cough: Secondary | ICD-10-CM

## 2017-02-08 DIAGNOSIS — J069 Acute upper respiratory infection, unspecified: Secondary | ICD-10-CM | POA: Diagnosis not present

## 2017-02-08 MED ORDER — DEXAMETHASONE SODIUM PHOSPHATE 10 MG/ML IJ SOLN
INTRAMUSCULAR | Status: AC
  Filled 2017-02-08: qty 1

## 2017-02-08 MED ORDER — FLUTICASONE PROPIONATE 50 MCG/ACT NA SUSP: 1.0000 | Freq: Every day | NASAL | 0 refills | Status: DC

## 2017-02-08 MED ORDER — DEXAMETHASONE SODIUM PHOSPHATE 10 MG/ML IJ SOLN
10.0000 mg | Freq: Once | INTRAMUSCULAR | Status: AC
  Administered 2017-02-08: 10 mg via INTRAMUSCULAR

## 2017-02-08 MED ORDER — AMOXICILLIN-POT CLAVULANATE 875-125 MG PO TABS: 1.0000 | ORAL_TABLET | Freq: Two times a day (BID) | ORAL | 0 refills | Status: DC

## 2017-02-08 NOTE — ED Triage Notes (Signed)
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 NAD... Ambulatory

## 2017-02-08 NOTE — Discharge Instructions (Signed)
Continue to push fluids and take over the counter medications as directed on the back of the box for symptomatic relief.  ° °

## 2017-02-08 NOTE — Telephone Encounter (Signed)
Pt requested Rxs to be sent to [facility] on [address] instead of [place].  Ok per NP.

## 2017-02-08 NOTE — ED Provider Notes (Signed)
CSN: [id number]     Arrival date & time 02/08/17  1711 History   None    Chief Complaint  Patient presents with  . URI   (Consider location/radiation/quality/duration/timing/severity/associated sxs/prior Treatment) 36 y.o. female presents with nasal congestion, sore throat, productive cough and fever  X 1.5 weeks. . Condition is acute in nature. Condition is made better by nothing . Condition is made worse by nothing. Patient denies any relief from OTC medication. prior to there arrival at this facility. Patient denies any nausea, vomiting or diarrhea.        Past Medical History:  Diagnosis Date  . Breast nodule   . Gastric ulcer   . No pertinent past medical history    Past Surgical History:  Procedure Laterality Date  . CHOLECYSTECTOMY    . LAPAROSCOPIC APPENDECTOMY N/A 11/13/2013   Procedure: LAP ASSISTED PARTIAL CECECTOMY;  Surgeon: Odis Hollingshead, MD;  Location: [facility];  Service: General;  Laterality: N/A;  . leg sugery  1994  . TUBAL LIGATION  2006   Family History  Problem Relation Age of Onset  . Hypertension Mother   . Diabetes Mother   . Hypertension Maternal Grandmother   . Other Neg Hx    Social History  Substance Use Topics  . Smoking status: Current Some Day Smoker    Packs/day: 17.00  . Smokeless tobacco: Never Used  . Alcohol use Yes     Comment: occ   OB History    Gravida Para Term Preterm AB Living   5 3 3   2 3    SAB TAB Ectopic Multiple Live Births   2             Review of Systems  Constitutional: Negative for chills and fever.  HENT: Positive for congestion, sinus pain and sore throat. Negative for ear pain.   Eyes: Negative for pain and visual disturbance.  Respiratory: Positive for cough ( productive). Negative for shortness of breath.   Cardiovascular: Negative for chest pain and palpitations.  Gastrointestinal: Negative for abdominal pain and vomiting.  Genitourinary: Negative for dysuria and hematuria.  Musculoskeletal: Negative  for arthralgias and back pain.  Skin: Negative for color change and rash.  Neurological: Negative for seizures and syncope.  All other systems reviewed and are negative.   Allergies  Patient has no known allergies.  Home Medications   Prior to Admission medications   Medication Sig Start Date End Date Taking? Authorizing Provider  amoxicillin-clavulanate (AUGMENTIN) 875-125 MG tablet Take 1 tablet by mouth every 12 (twelve) hours. 02/08/17   Jacqualine Mau, NP  docusate sodium (COLACE) 100 MG capsule Take 100 mg by mouth daily as needed for mild constipation.    Stover, Titorya, DPM  fluticasone (FLONASE) 50 MCG/ACT nasal spray Place 1 spray into both nostrils daily. 02/08/17   Jacqualine Mau, NP  HYDROcodone-acetaminophen (NORCO) 10-325 MG tablet Take 1 tablet by mouth every 4 (four) hours as needed.    Landis Martins, DPM  HYDROmorphone (DILAUDID) 4 MG tablet Take 1 tablet (4 mg total) by mouth every 4 (four) hours as needed for severe pain. 05/28/16   Landis Martins, DPM  omeprazole (PRILOSEC OTC) 20 MG tablet Take 1 tablet (20 mg total) by mouth daily. 08/12/12 11/05/14  Tamala Julian, [place], CNM  pantoprazole (PROTONIX) 20 MG tablet Take 1 tablet (20 mg total) by mouth daily. 01/21/16   Shary Decamp, PA-C  promethazine (PHENERGAN) 25 MG tablet Take 25 mg by mouth every 8 (eight) hours  as needed for nausea or vomiting.    Landis Martins, DPM   Meds Ordered and Administered this Visit  Medications - No data to display  BP 117/88 (BP Location: Left Arm)   Pulse 79   Temp 98.6 F (37 C) (Oral)   Resp 20   LMP 02/08/2017   SpO2 98%  No data found.   Physical Exam  Constitutional: She is oriented to person, place, and time. She appears well-developed and well-nourished.  HENT:  Head: Normocephalic and atraumatic.  Eyes: Conjunctivae are normal.  Neck: Normal range of motion.  Cardiovascular: Normal rate and regular rhythm.   Pulmonary/Chest: Effort normal and breath sounds  normal.  Musculoskeletal: Normal range of motion.  Neurological: She is alert and oriented to person, place, and time.  Skin: Skin is warm.  Psychiatric: She has a normal mood and affect.  Nursing note and vitals reviewed.   Urgent Care Course     Procedures (including critical care time)  Labs Review Labs Reviewed - No data to display  Imaging Review No results found.       MDM   1. Upper respiratory tract infection, unspecified type       Jacqualine Mau, NP 02/08/17 1800

## 2017-02-09 ENCOUNTER — Encounter (HOSPITAL_COMMUNITY): Payer: Self-pay | Admitting: Emergency Medicine

## 2017-02-09 ENCOUNTER — Emergency Department (HOSPITAL_COMMUNITY): Payer: Medicaid Other

## 2017-02-09 ENCOUNTER — Emergency Department (HOSPITAL_COMMUNITY)
Admission: EM | Admit: 2017-02-09 | Discharge: 2017-02-10 | Disposition: A | Discharge status: Home / Self Care | Payer: Medicaid Other | Attending: Emergency Medicine | Admitting: Emergency Medicine

## 2017-02-09 DIAGNOSIS — Z79899 Other long term (current) drug therapy: Secondary | ICD-10-CM | POA: Diagnosis not present

## 2017-02-09 DIAGNOSIS — J069 Acute upper respiratory infection, unspecified: Secondary | ICD-10-CM | POA: Insufficient documentation

## 2017-02-09 DIAGNOSIS — F17200 Nicotine dependence, unspecified, uncomplicated: Secondary | ICD-10-CM | POA: Insufficient documentation

## 2017-02-09 DIAGNOSIS — J029 Acute pharyngitis, unspecified: Secondary | ICD-10-CM | POA: Insufficient documentation

## 2017-02-09 NOTE — ED Notes (Signed)
Pt reports multiple complaints, seen yesterday for the same and stating the antibiotics and steroids are not working and the nasal spray is drying out her nose.

## 2017-02-09 NOTE — ED Triage Notes (Signed)
Pt reports having sore throat and cough for the last week. Pt also reporting nasal congestion and dental pain.

## 2017-02-09 NOTE — ED Provider Notes (Signed)
[facility] DEPT Provider Note   CSN: [id number] Arrival date & time: 02/09/17  2246     History   Chief Complaint Chief Complaint  Patient presents with  . Sore Throat    HPI Linda Odom is a 36 y.o. female.  HPI  Patient presents with 2 week history of nasal congestion, cough, sore throat, "stuffiness in my head." She states that she was seen at the urgent care yesterday and was given antibiotic (Augmentin) and nasal spray. She states that the antibiotics have not worked yet and the nasal spray is causing her to have a headache. She reports that she is staying up all night and unable to sleep due to the cough. Also feel like she has fluid in her head and stuffiness in her nose. She reports that the cough is dry and denies hemoptysis. She denies any chest pain or trouble breathing, nausea, vomiting, abdominal pain, leg swelling, vision changes, headache, injury.  Past Medical History:  Diagnosis Date  . Breast nodule   . Gastric ulcer   . No pertinent past medical history     Patient Active Problem List   Diagnosis Date Noted  . Acute appendicitis 11/13/2013  . Acute appendicitis, unspecified acute appendicitis type 11/13/2013    Past Surgical History:  Procedure Laterality Date  . CHOLECYSTECTOMY    . LAPAROSCOPIC APPENDECTOMY N/A 11/13/2013   Procedure: LAP ASSISTED PARTIAL CECECTOMY;  Surgeon: Odis Hollingshead, MD;  Location: [facility];  Service: General;  Laterality: N/A;  . leg sugery  1994  . TUBAL LIGATION  2006    OB History    Gravida Para Term Preterm AB Living   5 3 3   2 3    SAB TAB Ectopic Multiple Live Births   2               Home Medications    Prior to Admission medications   Medication Sig Start Date End Date Taking? Authorizing Provider  amoxicillin-clavulanate (AUGMENTIN) 875-125 MG tablet Take 1 tablet by mouth every 12 (twelve) hours. 02/08/17   Jacqualine Mau, NP  docusate sodium (COLACE) 100 MG capsule Take 100 mg by mouth daily  as needed for mild constipation.    Stover, Titorya, DPM  fluticasone (FLONASE) 50 MCG/ACT nasal spray Place 1 spray into both nostrils daily. 02/08/17   Jacqualine Mau, NP  guaiFENesin-codeine (ROBITUSSIN AC) 100-10 MG/5ML syrup Take 5 mLs by mouth 3 (three) times daily as needed for cough. 02/10/17   Linde Wilensky, PA-C  HYDROcodone-acetaminophen (NORCO) 10-325 MG tablet Take 1 tablet by mouth every 4 (four) hours as needed.    Landis Martins, DPM  HYDROmorphone (DILAUDID) 4 MG tablet Take 1 tablet (4 mg total) by mouth every 4 (four) hours as needed for severe pain. 05/28/16   Landis Martins, DPM  omeprazole (PRILOSEC OTC) 20 MG tablet Take 1 tablet (20 mg total) by mouth daily. 08/12/12 11/05/14  Tamala Julian, [place], CNM  pantoprazole (PROTONIX) 20 MG tablet Take 1 tablet (20 mg total) by mouth daily. 01/21/16   Shary Decamp, PA-C  promethazine (PHENERGAN) 25 MG tablet Take 25 mg by mouth every 8 (eight) hours as needed for nausea or vomiting.    Landis Martins, DPM    Family History Family History  Problem Relation Age of Onset  . Hypertension Mother   . Diabetes Mother   . Hypertension Maternal Grandmother   . Other Neg Hx     Social History Social History  Substance Use Topics  .  Smoking status: Current Some Day Smoker    Packs/day: 17.00  . Smokeless tobacco: Never Used  . Alcohol use Yes     Comment: occ     Allergies   Patient has no known allergies.   Review of Systems Review of Systems  Constitutional: Positive for chills. Negative for appetite change and fever.  HENT: Positive for congestion, ear pain, rhinorrhea, sinus pressure and sore throat. Negative for sneezing.   Eyes: Positive for itching. Negative for photophobia and visual disturbance.  Respiratory: Positive for cough. Negative for chest tightness, shortness of breath and wheezing.   Cardiovascular: Negative for chest pain and palpitations.  Gastrointestinal: Negative for abdominal pain, constipation,  diarrhea, nausea and vomiting.  Musculoskeletal: Negative for myalgias.  Skin: Negative for rash.  Neurological: Positive for headaches. Negative for dizziness, weakness and light-headedness.     Physical Exam Updated Vital Signs BP 129/73 (BP Location: Left Arm)   Pulse 86   Temp 98.3 F (36.8 C) (Oral)   Resp 20   Ht 5\' 2"  (1.575 m)   Wt 113.4 kg (250 lb)   LMP 02/02/2017   SpO2 100%   BMI 45.73 kg/m   Physical Exam  Constitutional: She appears well-developed and well-nourished. No distress.  HENT:  Head: Normocephalic and atraumatic.  Right Ear: Tympanic membrane normal.  Left Ear: Tympanic membrane normal.  Nose: Mucosal edema present.  Mouth/Throat: Uvula is midline and mucous membranes are normal. Posterior oropharyngeal erythema present. No tonsillar exudate.  Eyes: Conjunctivae and EOM are normal. Pupils are equal, round, and reactive to light. Right eye exhibits no discharge. Left eye exhibits no discharge. No scleral icterus.  Neck: Normal range of motion. Neck supple.  Cardiovascular: Normal rate, regular rhythm, normal heart sounds and intact distal pulses.   Pulmonary/Chest: Effort normal and breath sounds normal. No respiratory distress. She has no wheezes. She exhibits no tenderness.  Abdominal: Soft. Bowel sounds are normal. She exhibits no distension. There is no tenderness. There is no guarding.  Musculoskeletal: Normal range of motion. She exhibits no edema.  No leg swelling or calf tenderness.  Lymphadenopathy:    She has no cervical adenopathy.  Neurological: She is alert. She exhibits normal muscle tone. Coordination normal.  Skin: Skin is warm and dry. No rash noted.  Psychiatric: She has a normal mood and affect.  Nursing note and vitals reviewed.    ED Treatments / Results  Labs (all labs ordered are listed, but only abnormal results are displayed) Labs Reviewed  RAPID STREP SCREEN (NOT AT [facility])  CULTURE, GROUP A STREP [facility])    EKG  EKG  Interpretation None       Radiology Dg Chest 2 View  Result Date: 02/09/2017 CLINICAL DATA:  Cough and chest congestion EXAM: CHEST  2 VIEW COMPARISON:  Chest radiograph 05/18/2015 FINDINGS: The heart size and mediastinal contours are within normal limits. Both lungs are clear. The visualized skeletal structures are unremarkable. IMPRESSION: No active cardiopulmonary disease. Electronically Signed   By: Ulyses Jarred M.D.   On: 02/09/2017 23:35    Procedures Procedures (including critical care time)  Medications Ordered in ED Medications - No data to display   Initial Impression / Assessment and Plan / ED Course  I have reviewed the triage vital signs and the nursing notes.  Pertinent labs & imaging results that were available during my care of the patient were reviewed by me and considered in my medical decision making (see chart for details).  Patient presents with complaints of nasal congestion, sinus pressure, sore throat, cough for the past 1.5-2 weeks. She states that she was seen at the urgent care yesterday and has been taking her Augmentin today but it has not worked yet. Also reports headache with her Flonase. Patient does not appear in respiratory distress, satting at 100%, is not tachypnec or tachycardic. Patient is afebrile with no history of fever. Due to her cough for the past 2 weeks obtained chest x-ray which was negative for acute cardiopulmonary process. She did have mild posterior pharyngeal erythema so strep swab was obtained which returned as negative. I advised her that due to her sinusitis her antibiotics will take a few days before she starts having improvement. Since patient is already on Augmentin will advise her to continue this. I gave her cough medication to help with cough and to help her sleep. I advised her to follow up with her PCP for further evaluation if needed. I reassured her of her negative workup today. I told her that her other systemic symptoms  could be due to a viral illness. Patient appears stable for discharge at this time. Strict return precautions given.  Final Clinical Impressions(s) / ED Diagnoses   Final diagnoses:  Pharyngitis, unspecified etiology  Viral upper respiratory tract infection    New Prescriptions Discharge Medication List as of 02/10/2017 12:24 AM    START taking these medications   Details  guaiFENesin-codeine (ROBITUSSIN AC) 100-10 MG/5ML syrup Take 5 mLs by mouth 3 (three) times daily as needed for cough., Starting Mon 02/10/2017, [address], [place], PA-C 02/10/17 0041    Davonna Belling, MD 02/10/17 [phone]

## 2017-02-10 LAB — RAPID STREP SCREEN (MED CTR MEBANE ONLY): Streptococcus, Group A Screen (Direct): NEGATIVE

## 2017-02-10 MED ORDER — GUAIFENESIN-CODEINE 100-10 MG/5ML PO SYRP: 5.0000 mL | ORAL_SOLUTION | Freq: Three times a day (TID) | ORAL | 0 refills | Status: DC | PRN

## 2017-02-10 NOTE — Discharge Instructions (Signed)
Take cough medication as needed as directed. Continue antibiotics as prescribed yesterday. Continue ibuprofen or Tylenol as needed for pain or fever. Follow-up with PCP for further evaluation as needed. Return to chest pain, trouble breathing, productive cough, increased vomiting, high fevers, lightheadedness or loss of consciousness.

## 2017-02-12 LAB — CULTURE, GROUP A STREP (THRC)

## 2017-04-08 ENCOUNTER — Ambulatory Visit: Payer: Medicaid Other | Admitting: Sports Medicine

## 2017-09-09 ENCOUNTER — Encounter (HOSPITAL_COMMUNITY): Payer: Self-pay

## 2017-09-09 ENCOUNTER — Other Ambulatory Visit: Payer: Self-pay

## 2017-09-09 DIAGNOSIS — R05 Cough: Secondary | ICD-10-CM | POA: Insufficient documentation

## 2017-09-09 DIAGNOSIS — Z79899 Other long term (current) drug therapy: Secondary | ICD-10-CM | POA: Insufficient documentation

## 2017-09-09 DIAGNOSIS — F17200 Nicotine dependence, unspecified, uncomplicated: Secondary | ICD-10-CM | POA: Insufficient documentation

## 2017-09-09 DIAGNOSIS — J0100 Acute maxillary sinusitis, unspecified: Secondary | ICD-10-CM | POA: Diagnosis not present

## 2017-09-09 DIAGNOSIS — M25562 Pain in left knee: Secondary | ICD-10-CM | POA: Diagnosis not present

## 2017-09-09 DIAGNOSIS — M25561 Pain in right knee: Secondary | ICD-10-CM | POA: Insufficient documentation

## 2017-09-09 DIAGNOSIS — R0981 Nasal congestion: Secondary | ICD-10-CM | POA: Diagnosis present

## 2017-09-09 NOTE — ED Triage Notes (Signed)
Pt complaining of facial pain and "fluid in her face" x3 days. She also endorses nasal congestion, diarrhea, and knee pain. A&Ox4. Ambulatory.

## 2017-09-10 ENCOUNTER — Emergency Department (HOSPITAL_COMMUNITY)
Admission: EM | Admit: 2017-09-10 | Discharge: 2017-09-10 | Disposition: A | Discharge status: Home / Self Care | Payer: Medicaid Other | Attending: Emergency Medicine | Admitting: Emergency Medicine

## 2017-09-10 DIAGNOSIS — M25562 Pain in left knee: Secondary | ICD-10-CM

## 2017-09-10 DIAGNOSIS — R05 Cough: Secondary | ICD-10-CM

## 2017-09-10 DIAGNOSIS — J0100 Acute maxillary sinusitis, unspecified: Secondary | ICD-10-CM

## 2017-09-10 DIAGNOSIS — M25561 Pain in right knee: Secondary | ICD-10-CM

## 2017-09-10 DIAGNOSIS — R059 Cough, unspecified: Secondary | ICD-10-CM

## 2017-09-10 MED ORDER — DOXYCYCLINE HYCLATE 100 MG PO CAPS: 100.0000 mg | ORAL_CAPSULE | Freq: Two times a day (BID) | ORAL | 0 refills | Status: DC

## 2017-09-10 MED ORDER — HYDROCOD POLST-CPM POLST ER 10-8 MG/5ML PO SUER: 5.0000 mL | Freq: Two times a day (BID) | ORAL | 0 refills | Status: DC | PRN

## 2017-09-10 MED ORDER — DOXYCYCLINE HYCLATE 100 MG PO TABS
100.0000 mg | ORAL_TABLET | Freq: Once | ORAL | Status: AC
  Administered 2017-09-10: 100 mg via ORAL
  Filled 2017-09-10: qty 1

## 2017-09-10 NOTE — ED Provider Notes (Signed)
[facility] DEPT Provider Note: Georgena Spurling, MD, FACEP  CSN: [id number] MRN: [id number] ARRIVAL: 09/09/17 at 2220 ROOM: [place]  Facial Pain   HISTORY OF PRESENT ILLNESS  09/10/17 2:04 AM Linda Odom is a 37 y.o. female with almost a week of nasal congestion, maxillary sinus pain and cough.  She rates the pain in her face is a 10 out of 10.  The pain is dull in nature.  She has been using a nasal steroid and Zyrtec without relief.  She is also complaining of pain in her left arm and her bilateral knees which have both chronic and acute components.  She also complains of diarrhea which she attributes to taking antihistamines.  Consultation with the [facility] state controlled substances database reveals the patient has received 1 prescription for tramadol in the past year.   Past Medical History:  Diagnosis Date  . Breast nodule   . Gastric ulcer   . No pertinent past medical history     Past Surgical History:  Procedure Laterality Date  . CHOLECYSTECTOMY    . LAPAROSCOPIC APPENDECTOMY N/A 11/13/2013   Procedure: LAP ASSISTED PARTIAL CECECTOMY;  Surgeon: Odis Hollingshead, MD;  Location: [facility];  Service: General;  Laterality: N/A;  . leg sugery  1994  . TUBAL LIGATION  2006    Family History  Problem Relation Age of Onset  . Hypertension Mother   . Diabetes Mother   . Hypertension Maternal Grandmother   . Other Neg Hx     Social History   Tobacco Use  . Smoking status: Current Some Day Smoker    Packs/day: 17.00  . Smokeless tobacco: Never Used  Substance Use Topics  . Alcohol use: Yes    Comment: occ  . Drug use: No    Prior to Admission medications   Medication Sig Start Date End Date Taking? Authorizing Provider  cetirizine (ZYRTEC) 10 MG tablet Take 10 mg by mouth daily.   Yes [provider]  fluticasone (FLONASE) 50 MCG/ACT nasal spray Place 1 spray into both nostrils daily. 02/08/17  Yes Jacqualine Mau, NP    amoxicillin-clavulanate (AUGMENTIN) 875-125 MG tablet Take 1 tablet by mouth every 12 (twelve) hours. Patient not taking: Reported on 09/10/2017 02/08/17   Jacqualine Mau, NP  guaiFENesin-codeine (ROBITUSSIN AC) 100-10 MG/5ML syrup Take 5 mLs by mouth 3 (three) times daily as needed for cough. Patient not taking: Reported on 09/10/2017 02/10/17   Delia Heady, PA-C  HYDROmorphone (DILAUDID) 4 MG tablet Take 1 tablet (4 mg total) by mouth every 4 (four) hours as needed for severe pain. Patient not taking: Reported on 09/10/2017 05/28/16   Landis Martins, DPM  omeprazole (PRILOSEC OTC) 20 MG tablet Take 1 tablet (20 mg total) by mouth daily. Patient not taking: Reported on 09/10/2017 08/12/12 11/05/14  Tamala Julian, [place], CNM  pantoprazole (PROTONIX) 20 MG tablet Take 1 tablet (20 mg total) by mouth daily. Patient not taking: Reported on 09/10/2017 01/21/16   Shary Decamp, PA-C    Allergies Patient has no known allergies.   REVIEW OF SYSTEMS  Negative except as noted here or in the History of Present Illness.   PHYSICAL EXAMINATION  Initial Vital Signs Blood pressure 108/84, pulse 64, temperature 98.9 F (37.2 C), temperature source Oral, resp. rate 18, height 5\' 3"  (1.6 m), weight 113.4 kg (250 lb), SpO2 100 %.  Examination General: Well-developed, well-nourished female in no acute distress; appearance consistent with age of record HENT: normocephalic;  atraumatic; nasal congestion; maxillary sinuses tender to percussion Eyes: pupils equal, round and reactive to light; extraocular muscles intact Neck: supple Heart: regular rate and rhythm Lungs: clear to auscultation bilaterally Abdomen: soft; nondistended; nontender; bowel sounds present Extremities: No deformity; no edema; pain on movement of knees bilaterally Neurologic: Awake, alert and oriented; motor function intact in all extremities and symmetric; no facial droop Skin: Warm and dry Psychiatric: Normal mood and affect   RESULTS   Summary of this visit's results, reviewed by myself:   EKG Interpretation  Date/Time:    Ventricular Rate:    PR Interval:    QRS Duration:   QT Interval:    QTC Calculation:   R Axis:     Text Interpretation:        Laboratory Studies: No results found for this or any previous visit (from the past 24 hour(s)). Imaging Studies: No results found.  ED COURSE  Nursing notes and initial vitals signs, including pulse oximetry, reviewed.  Vitals:   09/09/17 2232 09/09/17 2233 09/10/17 0159  BP:  (!) 102/92 108/84  Pulse:  88 64  Resp:  18 18  Temp:  98.9 F (37.2 C)   TempSrc:  Oral   SpO2:  98% 100%  Weight: 113.4 kg (250 lb)    Height: 5\' 3"  (1.6 m)      PROCEDURES    ED DIAGNOSES     ICD-10-CM   1. Acute non-recurrent maxillary sinusitis J01.00   2. Cough R05   3. Bilateral anterior knee pain M25.561    M25.562        Kadija Cruzen, Jenny Reichmann, MD 09/10/17 [phone]

## 2018-02-24 DIAGNOSIS — M79676 Pain in unspecified toe(s): Secondary | ICD-10-CM

## 2018-04-08 ENCOUNTER — Emergency Department (HOSPITAL_COMMUNITY)
Admission: EM | Admit: 2018-04-08 | Discharge: 2018-04-09 | Disposition: A | Discharge status: Home / Self Care | Payer: Medicaid Other | Attending: Emergency Medicine | Admitting: Emergency Medicine

## 2018-04-08 ENCOUNTER — Other Ambulatory Visit: Payer: Self-pay

## 2018-04-08 DIAGNOSIS — R0981 Nasal congestion: Secondary | ICD-10-CM | POA: Diagnosis present

## 2018-04-08 DIAGNOSIS — F17200 Nicotine dependence, unspecified, uncomplicated: Secondary | ICD-10-CM | POA: Insufficient documentation

## 2018-04-08 DIAGNOSIS — J0101 Acute recurrent maxillary sinusitis: Secondary | ICD-10-CM | POA: Diagnosis not present

## 2018-04-08 NOTE — ED Triage Notes (Signed)
Patient c/o congestion, sinus drainage for the last 3 days.

## 2018-04-08 NOTE — ED Provider Notes (Signed)
[facility] EMERGENCY DEPARTMENT Provider Note   CSN: [id number] Arrival date & time: 04/08/18  2219     History   Chief Complaint Chief Complaint  Patient presents with  . Nasal Congestion    HPI Linda Odom is a 37 y.o. female.  Linda Odom is a 37 y.o. Female with a history of appendicitis, and recurrent sinus infections, presents to the emergency department for 3 to 5 days of congestion, sinus drainage and pressure.  She reports mild sore throat, bilateral ear pressure and associated nonproductive cough.  She denies fevers or chills, no chest pain or shortness of breath, no abdominal pain, nausea or vomiting.  Patient reports she has been using Flonase, Zyrtec and other over-the-counter cold and sinus medication but continues to feel miserable, her nose is so congested and running that she is been having difficulty sleeping.  She also reports using ibuprofen and Tylenol with minimal improvement.  She has tried sinus rinses in the past, but does not feel that these work for her.  Patient reports she seems to get the sinus infections over and over again her primary care doctor seen her in the past but she is never been referred to ENT and has requested information regarding this.     Past Medical History:  Diagnosis Date  . Breast nodule   . Gastric ulcer   . No pertinent past medical history     Patient Active Problem List   Diagnosis Date Noted  . Acute appendicitis 11/13/2013  . Acute appendicitis, unspecified acute appendicitis type 11/13/2013    Past Surgical History:  Procedure Laterality Date  . CHOLECYSTECTOMY    . LAPAROSCOPIC APPENDECTOMY N/A 11/13/2013   Procedure: LAP ASSISTED PARTIAL CECECTOMY;  Surgeon: Odis Hollingshead, MD;  Location: [facility];  Service: General;  Laterality: N/A;  . leg sugery  1994  . TUBAL LIGATION  2006     OB History    Gravida  5   Para  3   Term  3   Preterm      AB  2   Living  3     SAB  2   TAB      Ectopic      Multiple      Live Births               Home Medications    Prior to Admission medications   Medication Sig Start Date End Date Taking? Authorizing Provider  cetirizine (ZYRTEC) 10 MG tablet Take 10 mg by mouth daily.    [provider]  chlorpheniramine-HYDROcodone (TUSSIONEX PENNKINETIC ER) 10-8 MG/5ML SUER Take 5 mLs by mouth every 12 (twelve) hours as needed for cough. 09/10/17   Molpus, John, MD  doxycycline (VIBRAMYCIN) 100 MG capsule Take 1 capsule (100 mg total) by mouth 2 (two) times daily. One po bid x 7 days 09/10/17   Molpus, John, MD  fluticasone (FLONASE) 50 MCG/ACT nasal spray Place 1 spray into both nostrils daily. 02/08/17   Jacqualine Mau, NP    Family History Family History  Problem Relation Age of Onset  . Hypertension Mother   . Diabetes Mother   . Hypertension Maternal Grandmother   . Other Neg Hx     Social History Social History   Tobacco Use  . Smoking status: Current Some Day Smoker    Packs/day: 17.00  . Smokeless tobacco: Never Used  Substance Use Topics  . Alcohol use: Yes    Comment:  occ  . Drug use: No     Allergies   Patient has no known allergies.   Review of Systems Review of Systems  Constitutional: Negative for chills and fever.  HENT: Positive for congestion, ear pain, postnasal drip, rhinorrhea, sinus pressure, sinus pain, sneezing and sore throat. Negative for ear discharge and facial swelling.   Eyes: Negative for visual disturbance.  Respiratory: Positive for cough. Negative for chest tightness and shortness of breath.   Cardiovascular: Negative for chest pain.  Gastrointestinal: Negative for abdominal pain, nausea and vomiting.  Musculoskeletal: Negative for neck pain and neck stiffness.  Skin: Negative for color change, rash and wound.  Neurological: Positive for headaches. Negative for dizziness, weakness, light-headedness and numbness.     Physical Exam Updated Vital  Signs BP 136/90 (BP Location: Right Arm)   Pulse 68   Temp 98 F (36.7 C) (Oral)   Resp 18   Ht 5\' 3"  (1.6 m)   Wt 106.1 kg (234 lb)   LMP 04/06/2018 (Exact Date)   SpO2 100%   BMI 41.45 kg/m   Physical Exam  Constitutional: She appears well-developed and well-nourished. No distress.  HENT:  Head: Normocephalic and atraumatic.  TMs clear with good landmarks, severe nasal mucosa edema with clear rhinorrhea, palpation over bilateral maxillary sinuses, posterior oropharynx clear and moist, with some erythema, no edema or exudates, uvula midline.   Eyes: Right eye exhibits no discharge. Left eye exhibits no discharge.  Neck: Normal range of motion. Neck supple.  No rigidity  Cardiovascular: Normal rate, regular rhythm, normal heart sounds and intact distal pulses.  Pulmonary/Chest: Effort normal and breath sounds normal. No respiratory distress.  Respirations equal and unlabored, patient able to speak in full sentences, lungs clear to auscultation bilaterally  Abdominal: Soft. Bowel sounds are normal. She exhibits no distension and no mass. There is no tenderness. There is no guarding.  Lymphadenopathy:    She has cervical adenopathy (minimal).  Neurological: She is alert. Coordination normal.  Skin: Skin is warm and dry. Capillary refill takes less than 2 seconds. She is not diaphoretic.  Psychiatric: She has a normal mood and affect. Her behavior is normal.  Nursing note and vitals reviewed.    ED Treatments / Results  Labs (all labs ordered are listed, but only abnormal results are displayed) Labs Reviewed - No data to display  EKG None  Radiology No results found.  Procedures Procedures (including critical care time)  Medications Ordered in ED Medications  dexamethasone (DECADRON) injection 10 mg (10 mg Intramuscular Given 04/09/18 0035)  ketorolac (TORADOL) 30 MG/ML injection 30 mg (30 mg Intramuscular Given 04/09/18 0034)     Initial Impression / Assessment and  Plan / ED Course  I have reviewed the triage vital signs and the nursing notes.  Pertinent labs & imaging results that were available during my care of the patient were reviewed by me and considered in my medical decision making (see chart for details).  Patient complaining of symptoms of sinusitis.    Mild to moderate symptoms of clear/yellow nasal discharge/congestion and scratchy throat with cough for less than 10 days.  Patient is afebrile.  No concern for acute bacterial rhinosinusitis; likely viral in nature.  Patient treated symptomatically here in the ED today, and discussed further symptomatic treatment options for home.  Patient reports history of recurrent sinus infections, but frequently require antibiotics, given his symptoms have only been present for 3 to 5 days at this time, will continue to have patient  treat symptomatically, but given prescription for antibiotics which she is been instructed not to fill unless symptoms are not improving in the next 4 to 5 days.  Patient discharged with symptomatic treatment.  Patient instructions given for warm saline nasal washes.  Recommendations for follow-up with primary care physician, as well as ENT regarding recurrent sinus infections.   Final Clinical Impressions(s) / ED Diagnoses   Final diagnoses:  Acute recurrent maxillary sinusitis    ED Discharge Orders        Ordered    amoxicillin-clavulanate (AUGMENTIN) 875-125 MG tablet  2 times daily     04/09/18 0040       Jacqlyn Larsen, PA-C 04/09/18 3958    Ezequiel Essex, MD 04/09/18 [id number]

## 2018-04-09 MED ORDER — AMOXICILLIN-POT CLAVULANATE 875-125 MG PO TABS: 1.0000 | ORAL_TABLET | Freq: Two times a day (BID) | ORAL | 0 refills | Status: DC

## 2018-04-09 MED ORDER — KETOROLAC TROMETHAMINE 30 MG/ML IJ SOLN
30.0000 mg | Freq: Once | INTRAMUSCULAR | Status: AC
Start: 2018-04-09 — End: 2018-04-09
  Administered 2018-04-09: 30 mg via INTRAMUSCULAR
  Filled 2018-04-09: qty 1

## 2018-04-09 MED ORDER — DEXAMETHASONE SODIUM PHOSPHATE 10 MG/ML IJ SOLN
10.0000 mg | Freq: Once | INTRAMUSCULAR | Status: AC
  Administered 2018-04-09: 10 mg via INTRAMUSCULAR
  Filled 2018-04-09: qty 1

## 2018-04-09 NOTE — Discharge Instructions (Signed)
Your sinus infection is likely viral, continue supporting her symptoms with Flonase, Zyrtec, over-the-counter cough medications, you may also try Sudafed and sinus rinses.  Ibuprofen and Tylenol as needed for pain.  If your symptoms are still not improving in 4 days you may fill the antibiotic prescription were given and begin taking it twice daily.  Since you are having recurrent sinus infections please call to schedule a follow-up appointment with Dr. Redmond Baseman with ENT and please follow-up with your primary care doctor as well.  Return to the emergency department if you are having fevers, severe headache and neck stiffness, chest pain, cough or shortness of breath or any other new or concerning symptoms.

## 2018-06-10 IMAGING — CR DG CHEST 2V
2 series · 2 of 2 positions shown · non-contrast
Comparison: Chest radiograph 05/18/2015

CLINICAL DATA: Cough and chest congestion

EXAM:
CHEST  2 VIEW

[w chest pa]
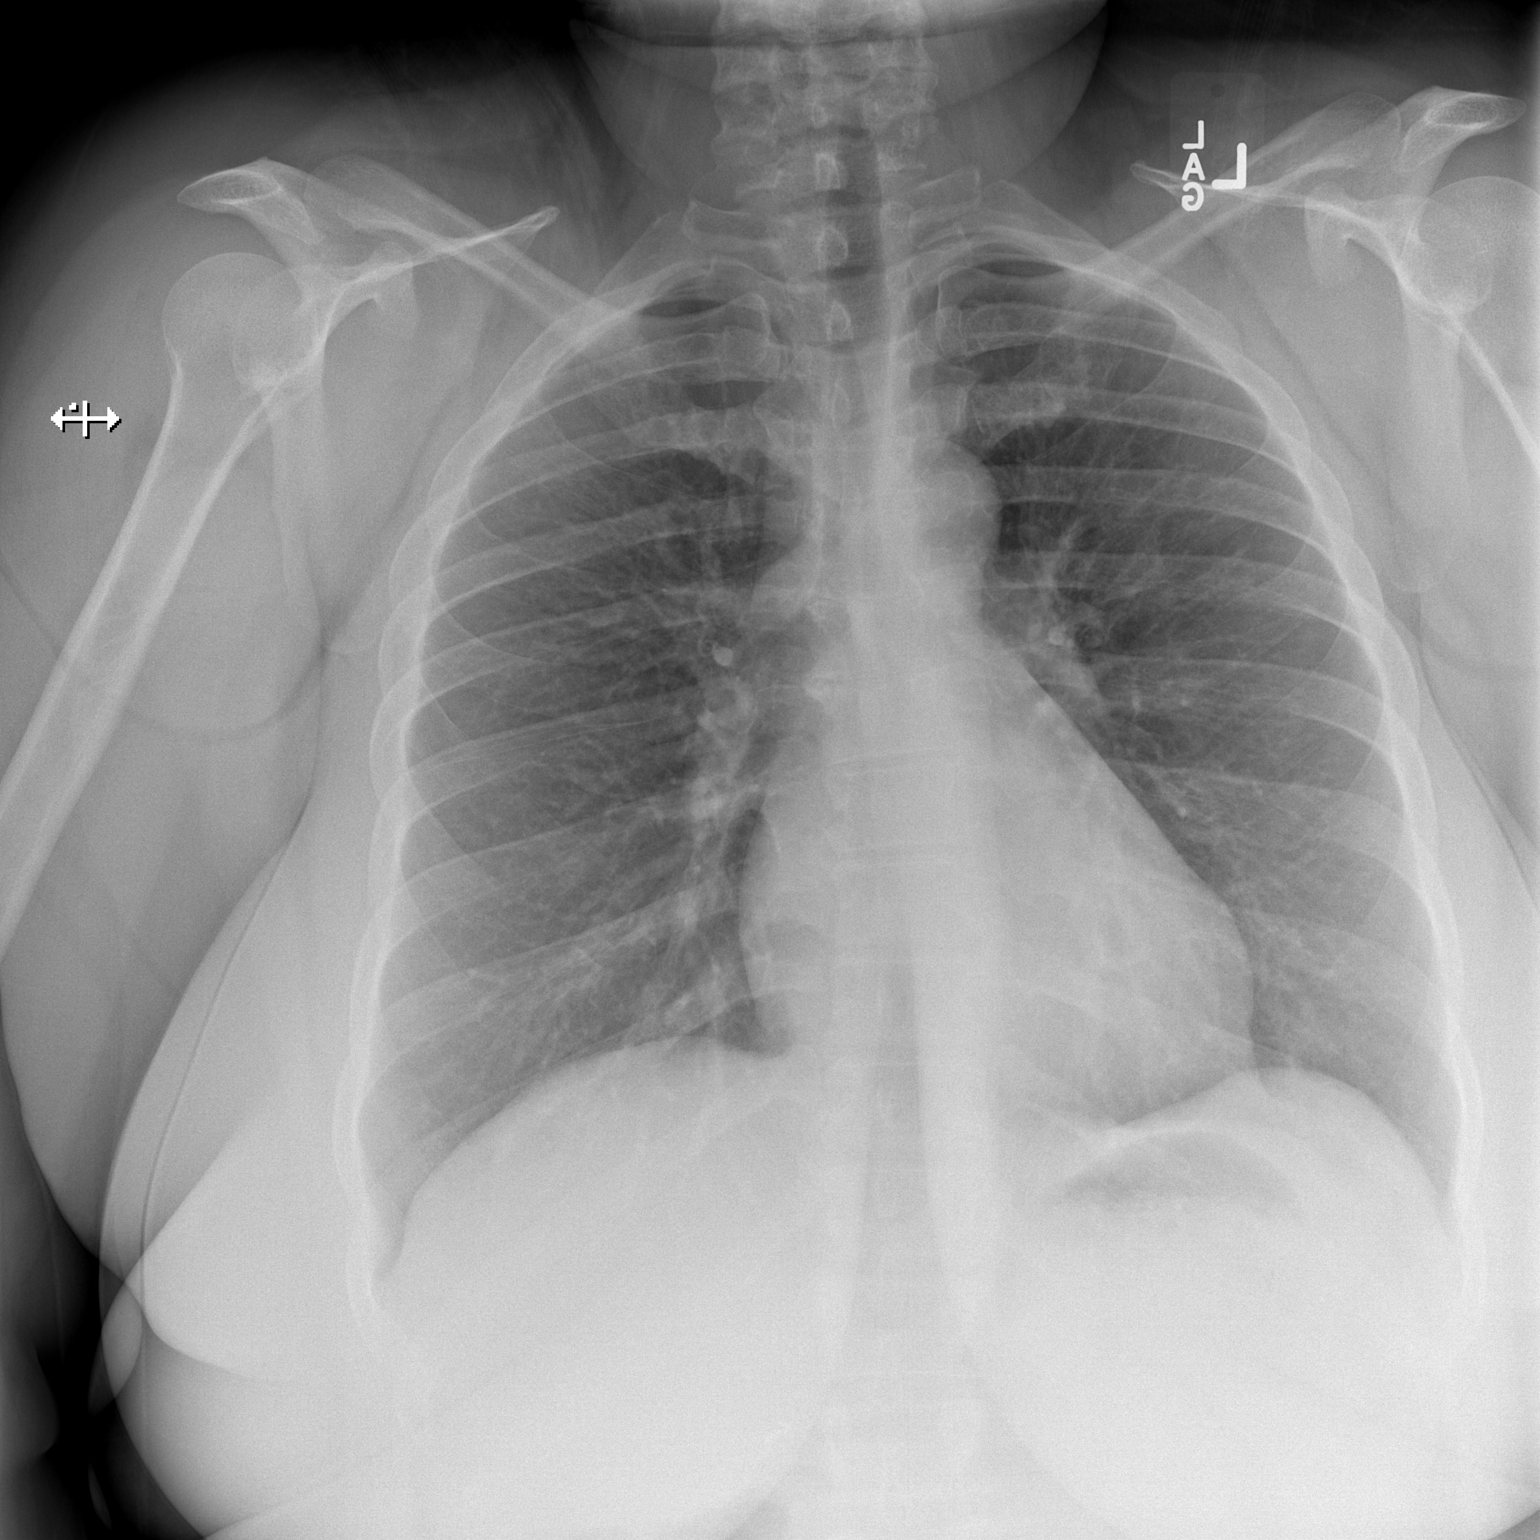

[w chest lat]
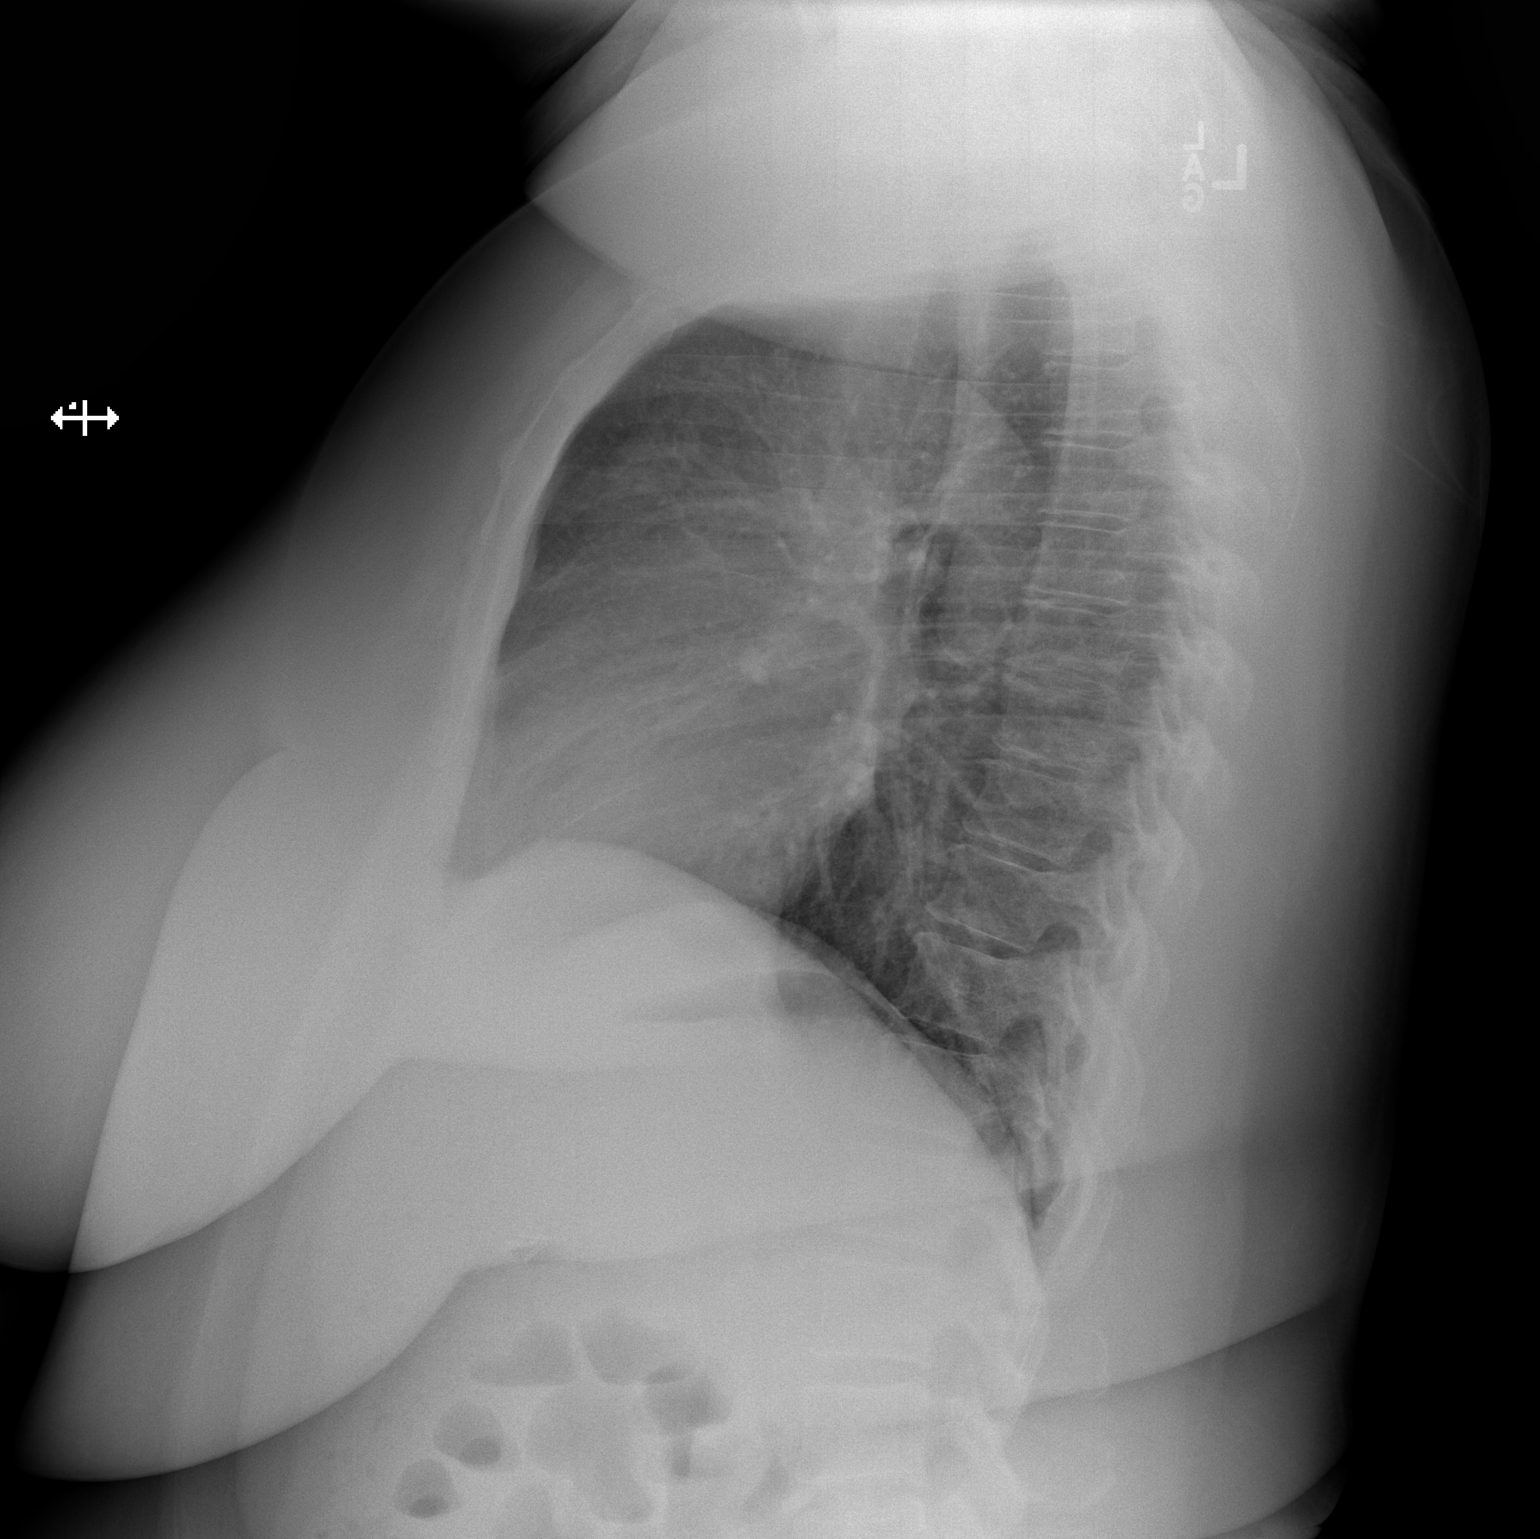

[2 of 2 positions shown; findings below may reference images not displayed]

FINDINGS: The heart size and mediastinal contours are within normal limits.
Both lungs are clear. The visualized skeletal structures are
unremarkable.
IMPRESSION: No active cardiopulmonary disease.

## 2018-06-26 ENCOUNTER — Telehealth: Payer: Self-pay | Admitting: *Deleted

## 2018-06-26 NOTE — Telephone Encounter (Signed)
Pt called left no message only name and phone number.

## 2018-06-26 NOTE — Telephone Encounter (Signed)
Pt states she needs her records and x-rays for Social [employer], and the man said he would send paper requesting. I told pt, our records coordinator would be out of the office until Tuesday, I would make sure she got the paperwork and this message.

## 2018-07-07 ENCOUNTER — Encounter (HOSPITAL_COMMUNITY): Payer: Self-pay

## 2018-07-07 ENCOUNTER — Emergency Department (HOSPITAL_COMMUNITY)
Admission: EM | Admit: 2018-07-07 | Discharge: 2018-07-08 | Disposition: A | Discharge status: Home / Self Care | Payer: Medicaid Other | Attending: Emergency Medicine | Admitting: Emergency Medicine

## 2018-07-07 DIAGNOSIS — Z79899 Other long term (current) drug therapy: Secondary | ICD-10-CM | POA: Diagnosis not present

## 2018-07-07 DIAGNOSIS — F17210 Nicotine dependence, cigarettes, uncomplicated: Secondary | ICD-10-CM | POA: Diagnosis not present

## 2018-07-07 DIAGNOSIS — J019 Acute sinusitis, unspecified: Secondary | ICD-10-CM | POA: Diagnosis not present

## 2018-07-07 DIAGNOSIS — G501 Atypical facial pain: Secondary | ICD-10-CM | POA: Insufficient documentation

## 2018-07-07 DIAGNOSIS — R0981 Nasal congestion: Secondary | ICD-10-CM | POA: Diagnosis present

## 2018-07-07 NOTE — ED Triage Notes (Signed)
Pt states that for the past week she has been dealing with a sinus infection that is unrelieved by medications prescribed by her doctor

## 2018-07-08 MED ORDER — PREDNISONE 10 MG PO TABS: 20.0000 mg | ORAL_TABLET | Freq: Two times a day (BID) | ORAL | 0 refills | Status: DC

## 2018-07-08 MED ORDER — DOXYCYCLINE HYCLATE 100 MG PO CAPS: 100.0000 mg | ORAL_CAPSULE | Freq: Two times a day (BID) | ORAL | 0 refills | Status: DC

## 2018-07-08 MED ORDER — DEXAMETHASONE SODIUM PHOSPHATE 10 MG/ML IJ SOLN
10.0000 mg | Freq: Once | INTRAMUSCULAR | Status: AC
  Administered 2018-07-08: 10 mg via INTRAMUSCULAR
  Filled 2018-07-08: qty 1

## 2018-07-08 NOTE — ED Provider Notes (Signed)
[facility] EMERGENCY DEPARTMENT Provider Note   CSN: [id number] Arrival date & time: 07/07/18  2314     History   Chief Complaint Chief Complaint  Patient presents with  . Facial Pain    HPI Linda Odom is a 37 y.o. female.  Patient is a 37 year old female presenting with complaints of nasal congestion, facial pain, and not feeling well for the past 10 days.  She denies any fevers or chills.  She has had sinusitis in the past and this feels similar.  She has had little relief with both prescription and over-the-counter medications.  The history is provided by the patient.    Past Medical History:  Diagnosis Date  . Breast nodule   . Gastric ulcer   . No pertinent past medical history     Patient Active Problem List   Diagnosis Date Noted  . Acute appendicitis 11/13/2013  . Acute appendicitis, unspecified acute appendicitis type 11/13/2013    Past Surgical History:  Procedure Laterality Date  . CHOLECYSTECTOMY    . LAPAROSCOPIC APPENDECTOMY N/A 11/13/2013   Procedure: LAP ASSISTED PARTIAL CECECTOMY;  Surgeon: Odis Hollingshead, MD;  Location: [facility];  Service: General;  Laterality: N/A;  . leg sugery  1994  . TUBAL LIGATION  2006     OB History    Gravida  5   Para  3   Term  3   Preterm      AB  2   Living  3     SAB  2   TAB      Ectopic      Multiple      Live Births               Home Medications    Prior to Admission medications   Medication Sig Start Date End Date Taking? Authorizing Provider  amoxicillin-clavulanate (AUGMENTIN) 875-125 MG tablet Take 1 tablet by mouth 2 (two) times daily. Fill begin taking after 4 days if symptoms are not improving 04/09/18   Jacqlyn Larsen, PA-C  cetirizine (ZYRTEC) 10 MG tablet Take 10 mg by mouth daily.    [provider]  chlorpheniramine-HYDROcodone (TUSSIONEX PENNKINETIC ER) 10-8 MG/5ML SUER Take 5 mLs by mouth every 12 (twelve) hours as needed for cough. 09/10/17    Molpus, John, MD  doxycycline (VIBRAMYCIN) 100 MG capsule Take 1 capsule (100 mg total) by mouth 2 (two) times daily. One po bid x 7 days 09/10/17   Molpus, John, MD  fluticasone (FLONASE) 50 MCG/ACT nasal spray Place 1 spray into both nostrils daily. 02/08/17   Jacqualine Mau, NP    Family History Family History  Problem Relation Age of Onset  . Hypertension Mother   . Diabetes Mother   . Hypertension Maternal Grandmother   . Other Neg Hx     Social History Social History   Tobacco Use  . Smoking status: Current Some Day Smoker    Packs/day: 17.00  . Smokeless tobacco: Never Used  Substance Use Topics  . Alcohol use: Yes    Comment: occ  . Drug use: No     Allergies   Patient has no known allergies.   Review of Systems Review of Systems  All other systems reviewed and are negative.    Physical Exam Updated Vital Signs BP (!) 141/98 (BP Location: Right Arm)   Pulse 74   Temp 98.1 F (36.7 C) (Oral)   Resp 16   LMP 07/03/2018  SpO2 100%   Physical Exam  Constitutional: She is oriented to person, place, and time. She appears well-developed and well-nourished. No distress.  HENT:  Head: Normocephalic and atraumatic.  Mouth/Throat: Oropharynx is clear and moist. No oropharyngeal exudate.  There is tenderness over the frontal and maxillary sinuses.  Neck: Normal range of motion. Neck supple.  Cardiovascular: Normal rate and regular rhythm.  No murmur heard. Pulmonary/Chest: Effort normal and breath sounds normal. No stridor. No respiratory distress.  Musculoskeletal: Normal range of motion.  Lymphadenopathy:    She has no cervical adenopathy.  Neurological: She is alert and oriented to person, place, and time.  Skin: Skin is warm and dry. She is not diaphoretic.  Nursing note and vitals reviewed.    ED Treatments / Results  Labs (all labs ordered are listed, but only abnormal results are displayed) Labs Reviewed - No data to  display  EKG None  Radiology No results found.  Procedures Procedures (including critical care time)  Medications Ordered in ED Medications  dexamethasone (DECADRON) injection 10 mg (has no administration in time range)     Initial Impression / Assessment and Plan / ED Course  I have reviewed the triage vital signs and the nursing notes.  Pertinent labs & imaging results that were available during my care of the patient were reviewed by me and considered in my medical decision making (see chart for details).  Patient with 10 days worth of nasal congestion and facial pain.  I suspect sinusitis.  She will be given an IM dose of Decadron along with doxycycline and prednisone.  She is to follow-up with her primary doctor if not improving.  She has a history of similar episodes and will follow up with her primary doctor for ENT referral.  Final Clinical Impressions(s) / ED Diagnoses   Final diagnoses:  None    ED Discharge Orders    None       Veryl Speak, MD 07/08/18 0155

## 2018-07-08 NOTE — Discharge Instructions (Addendum)
Doxycycline and prednisone as prescribed.  Continue over-the-counter medications as needed for symptomatic relief.  Follow-up with your primary doctor if your symptoms are not improving in the next week.

## 2018-07-09 ENCOUNTER — Telehealth: Payer: Self-pay | Admitting: Sports Medicine

## 2018-07-09 NOTE — Telephone Encounter (Signed)
I'm calling regarding about records sent from [employer]. I can be reached at [phone].

## 2018-07-09 NOTE — Telephone Encounter (Signed)
Called pt back and told her I had her papers. I explained I was out for three days and I'm behind and I will get to it as quickly as possible. Pt stated it's been here for over two weeks now. I apologized again and told her I'm working on catching up on my records and that I would get it taken care of.

## 2018-07-13 ENCOUNTER — Ambulatory Visit (INDEPENDENT_AMBULATORY_CARE_PROVIDER_SITE_OTHER): Payer: Medicaid Other | Admitting: Physician Assistant

## 2018-07-14 ENCOUNTER — Ambulatory Visit: Payer: Self-pay | Admitting: Sports Medicine

## 2018-07-29 ENCOUNTER — Encounter (INDEPENDENT_AMBULATORY_CARE_PROVIDER_SITE_OTHER): Payer: Self-pay | Admitting: Physician Assistant

## 2018-07-29 ENCOUNTER — Ambulatory Visit (INDEPENDENT_AMBULATORY_CARE_PROVIDER_SITE_OTHER): Payer: Medicaid Other

## 2018-07-29 ENCOUNTER — Ambulatory Visit (INDEPENDENT_AMBULATORY_CARE_PROVIDER_SITE_OTHER): Payer: Medicaid Other | Admitting: Physician Assistant

## 2018-07-29 ENCOUNTER — Ambulatory Visit (INDEPENDENT_AMBULATORY_CARE_PROVIDER_SITE_OTHER): Payer: Self-pay

## 2018-07-29 DIAGNOSIS — G8929 Other chronic pain: Secondary | ICD-10-CM

## 2018-07-29 DIAGNOSIS — M2242 Chondromalacia patellae, left knee: Secondary | ICD-10-CM | POA: Diagnosis not present

## 2018-07-29 DIAGNOSIS — M2241 Chondromalacia patellae, right knee: Secondary | ICD-10-CM

## 2018-07-29 DIAGNOSIS — M25562 Pain in left knee: Secondary | ICD-10-CM

## 2018-07-29 DIAGNOSIS — M25561 Pain in right knee: Secondary | ICD-10-CM | POA: Diagnosis not present

## 2018-07-29 NOTE — Progress Notes (Addendum)
Office Visit Note   Patient: Linda Linda Odom           Date of Birth: 11/14/1980           MRN: [id number] Visit Date: 07/29/2018              Requested by: Linda Ebbs, MD [address] [place], [place] [address] PCP: [facility]   Assessment & Plan: Visit Diagnoses:  1. Chronic pain of both knees   2. Chondromalacia of both patellae     Plan: Discussed with her knee friendly exercises shows her quad strengthening exercises particular for the VMO and have her demonstrate these back to me.  We will also send her to formal physical therapy for quad strengthening particular VMO and for home exercise program modalities.  She will continue her Mobic and tramadol.  Follow-up with [place] in 2 weeks check her progress lack of particularly of the left knee which she had a cortisone injection in today which she tolerated well.  Follow-Up Instructions: Return in about 2 weeks (around 08/12/2018).   Orders:  Orders Placed This Encounter  Procedures  . XR KNEE 3 VIEW RIGHT  . XR Knee 1-2 Views Left  . Ambulatory referral to Physical Therapy   No orders of the defined types were placed in this encounter.     Procedures: Large Joint Inj: L knee on 08/12/2018 1:17 PM Indications: pain Details: 22 G 1.5 in needle, anterolateral approach  Arthrogram: No  Medications: 3 mL lidocaine 1 %; 40 mg methylPREDNISolone acetate 40 MG/ML Outcome: tolerated well, no immediate complications Procedure, treatment alternatives, risks and benefits explained, specific risks discussed. Consent was given by the patient. Immediately prior to procedure a time out was called to verify the correct patient, procedure, equipment, support staff and site/side marked as required. Patient was prepped and draped in the usual sterile fashion.       Clinical Data: No additional findings.   Subjective: Chief Complaint  Patient presents with  . Left Knee - Pain  . Right Knee - Pain    HPI Linda Linda Odom is  a 37 year old female comes in today with bilateral knee pain.  No known injury.  She states she underwent foot surgery and was told to elevate her legs that she had fluid buildup behind her left knee.  Feels sensation of bone rubbing on bone both knees particularly with squats.  Notes that her knees give out at times.  She cannot ride in a car for too long due to pain in both knees.  She is tried Mobic and tramadol for the pain.  She has difficulty going up and down stairs due to pain in the knees.  She has been doing a lot of exercise and that is been running for at least an hour on a treadmill several times a week and has lost 30 pounds doing this.  Review of Systems No recent fevers or chills.  Objective: Vital Signs: Ht 5\' 4"  (1.626 m)   Wt 236 lb (107 kg)   LMP 07/03/2018   BMI 40.51 kg/m   Physical Exam  Constitutional: She is oriented to person, place, and time. She appears well-developed and well-nourished. No distress.  Pulmonary/Chest: Effort normal.  Neurological: She is alert and oriented to person, place, and time.  Skin: She is not diaphoretic.  Psychiatric: She has a normal mood and affect.    Ortho Exam Bilateral knees she is crepitus patellofemoral region with passive range of motion.  Tenderness  over the medial lateral joint line right knee tenderness over the medial joint line of the left knee.  Slight hyperextension of both knees and slight valgus deformities of both knees.  Osmond 's positive bilaterally.  Negative anterior drawer.  No instability valgus varus stress bilateral knees.  Specialty Comments:  No specialty comments available.  Imaging: Xr Knee 1-2 Views Left  Result Date: 07/29/2018 Left knee 3 views: No acute fractures.  Mild narrowing medial joint line.  Mild to moderate patellofemoral changes.  Slight lateralization of patella.  Xr Knee 3 View Right  Result Date: 07/29/2018 Right knee 3 views: No acute fracture.  Mild narrowing medial joint  line patellofemoral joint mild changes.  Slight lateralization of patella.    PMFS History: Patient Active Problem List   Diagnosis Date Noted  . Acute appendicitis 11/13/2013  . Acute appendicitis, unspecified acute appendicitis type 11/13/2013   Past Medical History:  Diagnosis Date  . Breast nodule   . Gastric ulcer   . No pertinent past medical history     Family History  Problem Relation Age of Onset  . Hypertension Mother   . Diabetes Mother   . Hypertension Maternal Grandmother   . Other Neg Hx     Past Surgical History:  Procedure Laterality Date  . CHOLECYSTECTOMY    . LAPAROSCOPIC APPENDECTOMY N/A 11/13/2013   Procedure: LAP ASSISTED PARTIAL CECECTOMY;  Surgeon: Linda Hollingshead, MD;  Location: [facility];  Service: General;  Laterality: N/A;  . leg sugery  1994  . TUBAL LIGATION  2006   Social History   Occupational History  . Not on file  Tobacco Use  . Smoking status: Current Some Day Smoker    Packs/day: 17.00  . Smokeless tobacco: Never Used  Substance and Sexual Activity  . Alcohol use: Yes    Comment: occ  . Drug use: No  . Sexual activity: Yes    Birth control/protection: Surgical

## 2018-08-10 DIAGNOSIS — M79676 Pain in unspecified toe(s): Secondary | ICD-10-CM

## 2018-08-12 ENCOUNTER — Ambulatory Visit (INDEPENDENT_AMBULATORY_CARE_PROVIDER_SITE_OTHER): Payer: Medicaid Other | Admitting: Physician Assistant

## 2018-08-12 ENCOUNTER — Encounter (INDEPENDENT_AMBULATORY_CARE_PROVIDER_SITE_OTHER): Payer: Self-pay | Admitting: Physician Assistant

## 2018-08-12 DIAGNOSIS — M2242 Chondromalacia patellae, left knee: Secondary | ICD-10-CM | POA: Diagnosis not present

## 2018-08-12 DIAGNOSIS — M25562 Pain in left knee: Secondary | ICD-10-CM | POA: Diagnosis not present

## 2018-08-12 DIAGNOSIS — M25561 Pain in right knee: Secondary | ICD-10-CM

## 2018-08-12 DIAGNOSIS — G8929 Other chronic pain: Secondary | ICD-10-CM | POA: Diagnosis not present

## 2018-08-12 DIAGNOSIS — M2241 Chondromalacia patellae, right knee: Secondary | ICD-10-CM | POA: Diagnosis not present

## 2018-08-12 MED ORDER — LIDOCAINE HCL 1 % IJ SOLN
3.0000 mL | INTRAMUSCULAR | Status: AC | PRN
  Administered 2018-08-12: 3 mL

## 2018-08-12 MED ORDER — DICLOFENAC SODIUM 75 MG PO TBEC: 75.0000 mg | DELAYED_RELEASE_TABLET | Freq: Two times a day (BID) | ORAL | 0 refills | Status: DC

## 2018-08-12 MED ORDER — METHYLPREDNISOLONE ACETATE 40 MG/ML IJ SUSP
40.0000 mg | INTRAMUSCULAR | Status: AC | PRN
  Administered 2018-08-12: 40 mg via INTRA_ARTICULAR

## 2018-08-12 NOTE — Progress Notes (Signed)
Office Visit Note   Patient: Linda Odom           Date of Birth: Dec 10, 1980           MRN: [id number] Visit Date: 08/12/2018              Requested by: [facility] [address] [place], [place] [address] PCP: [facility]   Assessment & Plan: Visit Diagnoses:  1. Chronic pain of both knees     Plan: We will discontinue her Mobic place her on diclofenac she is to take no other NSAIDs while on this and this discussed with her at length.  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  We did call physical therapy and they are able to get her in December 9 for therapy to work on quad strengthening home exercise program and modalities.  She will follow-up with [place] on as-needed basis pain persist or becomes worse.  Questions encouraged and answered.   Follow-Up Instructions: Return if symptoms worsen or fail to improve.   Orders:  No orders of the defined types were placed in this encounter.  Meds ordered this encounter  Medications  . diclofenac (VOLTAREN) 75 MG EC tablet    Sig: Take 1 tablet (75 mg total) by mouth 2 (two) times daily.    Dispense:  60 tablet    Refill:  0      Procedures: No procedures performed   Clinical Data: No additional findings.   Subjective: Chief Complaint  Patient presents with  . Right Knee - Pain  . Left Knee - Pain    HPI Mrs. Diener returns today follow-up of bilateral knees.  She underwent a left knee intra-articular injection with steroid she states that she no longer has any pain.  She is having some soreness down the lateral aspect of her left leg.  She said no new injury.  Been doing quad strengthening.  States both knees still pop but are not particularly painful.  She does not feel like the Mobic is helped with her knee pain.  She unfortunately has not been able to start physical therapy as of yet.  She continues to work on weight loss. Review of Systems See HPI  Objective: Vital Signs: There were no vitals  taken for this visit.  Physical Exam General: Well-developed well-nourished female no acute distress. Ortho Exam Bilateral knees: She has considerable patellofemoral crepitus.  No effusion abnormal warmth of either knee.  No instability of either knee.  Left proximal lower leg she has 2 small areas appear to be some type of insect bite there is no frank infection.  No ecchymosis. Specialty Comments:  No specialty comments available.  Imaging: No results found.   PMFS History: Patient Active Problem List   Diagnosis Date Noted  . Acute appendicitis 11/13/2013  . Acute appendicitis, unspecified acute appendicitis type 11/13/2013   Past Medical History:  Diagnosis Date  . Breast nodule   . Gastric ulcer   . No pertinent past medical history     Family History  Problem Relation Age of Onset  . Hypertension Mother   . Diabetes Mother   . Hypertension Maternal Grandmother   . Other Neg Hx     Past Surgical History:  Procedure Laterality Date  . CHOLECYSTECTOMY    . LAPAROSCOPIC APPENDECTOMY N/A 11/13/2013   Procedure: LAP ASSISTED PARTIAL CECECTOMY;  Surgeon: Odis Hollingshead, MD;  Location: [facility];  Service: General;  Laterality: N/A;  . leg sugery  [place]  2006   Social History   Occupational History  . Not on file  Tobacco Use  . Smoking status: Current Some Day Smoker    Packs/day: 17.00  . Smokeless tobacco: Never Used  Substance and Sexual Activity  . Alcohol use: Yes    Comment: occ  . Drug use: No  . Sexual activity: Yes    Birth control/protection: Surgical

## 2018-08-13 ENCOUNTER — Ambulatory Visit: Payer: Medicaid Other | Admitting: Allergy

## 2018-08-17 ENCOUNTER — Ambulatory Visit: Payer: Medicaid Other | Admitting: Physical Therapy

## 2019-06-18 ENCOUNTER — Ambulatory Visit (HOSPITAL_COMMUNITY)
Admission: EM | Admit: 2019-06-18 | Discharge: 2019-06-18 | Disposition: A | Discharge status: Home / Self Care | Payer: Medicaid Other | Attending: Emergency Medicine | Admitting: Emergency Medicine

## 2019-06-18 ENCOUNTER — Other Ambulatory Visit: Payer: Self-pay

## 2019-06-18 ENCOUNTER — Encounter (HOSPITAL_COMMUNITY): Payer: Self-pay

## 2019-06-18 DIAGNOSIS — J22 Unspecified acute lower respiratory infection: Secondary | ICD-10-CM | POA: Diagnosis not present

## 2019-06-18 DIAGNOSIS — F17200 Nicotine dependence, unspecified, uncomplicated: Secondary | ICD-10-CM

## 2019-06-18 DIAGNOSIS — Z20828 Contact with and (suspected) exposure to other viral communicable diseases: Secondary | ICD-10-CM | POA: Diagnosis not present

## 2019-06-18 DIAGNOSIS — Z1159 Encounter for screening for other viral diseases: Secondary | ICD-10-CM | POA: Diagnosis not present

## 2019-06-18 DIAGNOSIS — R8290 Unspecified abnormal findings in urine: Secondary | ICD-10-CM

## 2019-06-18 HISTORY — DX: Unspecified osteoarthritis, unspecified site: M19.90

## 2019-06-18 LAB — POCT URINALYSIS DIP (DEVICE)
Bilirubin Urine: NEGATIVE
Glucose, UA: NEGATIVE mg/dL
Hgb urine dipstick: NEGATIVE
Ketones, ur: NEGATIVE mg/dL
Leukocytes,Ua: NEGATIVE
Nitrite: NEGATIVE
Protein, ur: NEGATIVE mg/dL
Specific Gravity, Urine: 1.025 (ref 1.005–1.030)
Urobilinogen, UA: 0.2 mg/dL (ref 0.0–1.0)
pH: 6.5 (ref 5.0–8.0)

## 2019-06-18 MED ORDER — ALBUTEROL SULFATE HFA 108 (90 BASE) MCG/ACT IN AERS: 1.0000 | INHALATION_SPRAY | Freq: Four times a day (QID) | RESPIRATORY_TRACT | 0 refills | Status: AC | PRN

## 2019-06-18 MED ORDER — CETIRIZINE HCL 10 MG PO CAPS: 10.0000 mg | ORAL_CAPSULE | Freq: Every day | ORAL | 0 refills | Status: AC

## 2019-06-18 MED ORDER — PREDNISONE 50 MG PO TABS: 50.0000 mg | ORAL_TABLET | Freq: Every day | ORAL | 0 refills | Status: AC

## 2019-06-18 MED ORDER — AZITHROMYCIN 250 MG PO TABS: 250.0000 mg | ORAL_TABLET | Freq: Every day | ORAL | 0 refills | Status: DC

## 2019-06-18 MED ORDER — BENZONATATE 200 MG PO CAPS: 200.0000 mg | ORAL_CAPSULE | Freq: Three times a day (TID) | ORAL | 0 refills | Status: AC | PRN

## 2019-06-18 NOTE — Discharge Instructions (Addendum)
Your urine was completely normal, no signs of infection. Please drink plenty of fluids  Please begin daily cetirizine to help with nasal congestion/drainage Tessalon as needed for cough every 8 hours Albuterol inhaler every 4-6 hours for shortness of breath, chest tightness Daily prednisone to help decrease inflammation congestion within sinuses and chest, take with food and in the morning if you are able Begin azithromycin-2 tablets today, 1 tablet for the following 4 days  Please continue to rest and drink plenty of fluids We will call if COVID swab positive Follow-up if symptoms changing, worsening, developing increased shortness of breath, difficulty breathing, chest pain, persistent symptoms

## 2019-06-18 NOTE — ED Provider Notes (Signed)
[facility]    CSN: [id number] Arrival date & time: 06/18/19  [id number]      History   Chief Complaint Chief Complaint  Patient presents with  . Cough  . Urinary Tract Infection  . Nasal Congestion    HPI Linda Odom is a 38 y.o. female history of previous tubal ligation, previous appendectomy, presenting today for evaluation of possible UTI and cough and congestion.  Patient states that over the past week she has had nasal congestion as well as a nonproductive cough.  At times she has had chest tightness.  She has tried using her friend's breathing treatment which provided relief temporarily.  Patient denies any fevers chills or body aches.  She has tried over-the-counter Mucinex and Delsym without relief.  She has not seen any improvement in her symptoms over the past week.  She does endorse tobacco use, approximately 3 cigarettes/day.  Denies known exposure to COVID.  She is also concerned about UTI she has had a foul urinary odor.  She denies dysuria.  She does have urinary frequency, but also notes that she is on a fluid pill.  She notes that she often drinks teas and sodas and believes this is the trigger.  She tried over-the-counter Azo.  Denies abnormal discharge, itching or irritation.  HPI  Past Medical History:  Diagnosis Date  . Arthritis   . Breast nodule   . Gastric ulcer   . No pertinent past medical history     Patient Active Problem List   Diagnosis Date Noted  . Acute appendicitis 11/13/2013  . Acute appendicitis, unspecified acute appendicitis type 11/13/2013    Past Surgical History:  Procedure Laterality Date  . CHOLECYSTECTOMY    . LAPAROSCOPIC APPENDECTOMY N/A 11/13/2013   Procedure: LAP ASSISTED PARTIAL CECECTOMY;  Surgeon: Odis Hollingshead, MD;  Location: [facility];  Service: General;  Laterality: N/A;  . leg sugery  1994  . TUBAL LIGATION  2006    OB History    Gravida  5   Para  3   Term  3   Preterm      AB  2   Living  3    SAB  2   TAB      Ectopic      Multiple      Live Births               Home Medications    Prior to Admission medications   Medication Sig Start Date End Date Taking? Authorizing Provider  albuterol (VENTOLIN HFA) 108 (90 Base) MCG/ACT inhaler Inhale 1-2 puffs into the lungs every 6 (six) hours as needed for wheezing or shortness of breath. 06/18/19   Niamh Rada C, PA-C  azithromycin (ZITHROMAX) 250 MG tablet Take 1 tablet (250 mg total) by mouth daily. Take first 2 tablets together, then 1 every day until finished. 06/18/19   Ohn Bostic C, PA-C  benzonatate (TESSALON) 200 MG capsule Take 1 capsule (200 mg total) by mouth 3 (three) times daily as needed for up to 7 days for cough. 06/18/19 06/25/19  Braedyn Kauk C, PA-C  Cetirizine HCl 10 MG CAPS Take 1 capsule (10 mg total) by mouth daily. 06/18/19   Jacee Enerson C, PA-C  diclofenac (VOLTAREN) 75 MG EC tablet Take 1 tablet (75 mg total) by mouth 2 (two) times daily. 08/12/18   Pete Pelt, PA-C  furosemide (LASIX) 40 MG tablet Take 40 mg by mouth.    [provider]  predniSONE (DELTASONE) 50 MG tablet Take 1 tablet (50 mg total) by mouth daily for 5 days. 06/18/19 06/23/19  Romey Mathieson C, PA-C  traMADol (ULTRAM) 50 MG tablet TAKE 1 TABLET BY MOUTH EVERY 6 HOURS AS NEEDED FOR SEVERE PAIN 08/04/18   [provider]    Family History Family History  Problem Relation Age of Onset  . Hypertension Mother   . Diabetes Mother   . Hypertension Maternal Grandmother   . Other Neg Hx     Social History Social History   Tobacco Use  . Smoking status: Current Some Day Smoker    Packs/day: 17.00  . Smokeless tobacco: Never Used  Substance Use Topics  . Alcohol use: Yes    Comment: occ  . Drug use: No     Allergies   Patient has no known allergies.   Review of Systems Review of Systems  Constitutional: Negative for activity change, appetite change, chills, fatigue and fever.  HENT:  Positive for congestion and rhinorrhea. Negative for ear pain, sinus pressure, sore throat and trouble swallowing.   Eyes: Negative for discharge and redness.  Respiratory: Positive for cough and chest tightness. Negative for shortness of breath.   Cardiovascular: Negative for chest pain.  Gastrointestinal: Negative for abdominal pain, diarrhea, nausea and vomiting.  Genitourinary: Positive for frequency. Negative for dysuria, flank pain, genital sores, hematuria, menstrual problem, vaginal bleeding, vaginal discharge and vaginal pain.  Musculoskeletal: Negative for back pain and myalgias.  Skin: Negative for rash.  Neurological: Negative for dizziness, light-headedness and headaches.     Physical Exam Triage Vital Signs ED Triage Vitals  Enc Vitals Group     BP 06/18/19 0934 (!) 148/87     Pulse Rate 06/18/19 0934 86     Resp 06/18/19 0934 16     Temp 06/18/19 0934 97.8 F (36.6 C)     Temp Source 06/18/19 0934 Temporal     SpO2 06/18/19 0934 98 %     Weight --      Height --      Head Circumference --      Peak Flow --      Pain Score 06/18/19 0937 5     Pain Loc --      Pain Edu? --      Excl. in [place]? --    No data found.  Updated Vital Signs BP (!) 148/87 (BP Location: Left Arm)   Pulse 86   Temp 97.8 F (36.6 C) (Temporal)   Resp 16   LMP 06/02/2019   SpO2 98%   Visual Acuity Right Eye Distance:   Left Eye Distance:   Bilateral Distance:    Right Eye Near:   Left Eye Near:    Bilateral Near:     Physical Exam Vitals signs and nursing note reviewed.  Constitutional:      General: She is not in acute distress.    Appearance: She is well-developed.  HENT:     Head: Normocephalic and atraumatic.     Ears:     Comments: Bilateral ears without tenderness to palpation of external auricle, tragus and mastoid, EAC's without erythema or swelling, TM's with good bony landmarks and cone of light. Non erythematous.     Nose:     Comments: Nasal mucosa pink,  mildly swollen turbinates, no rhinorrhea    Mouth/Throat:     Comments: Oral mucosa pink and moist, no tonsillar enlargement or exudate. Posterior pharynx patent and nonerythematous, no uvula deviation or  swelling. Normal phonation.  Eyes:     Conjunctiva/sclera: Conjunctivae normal.  Neck:     Musculoskeletal: Neck supple.  Cardiovascular:     Rate and Rhythm: Normal rate and regular rhythm.     Heart sounds: No murmur.  Pulmonary:     Effort: Pulmonary effort is normal. No respiratory distress.     Breath sounds: Normal breath sounds.     Comments: Breathing comfortably at rest, CTABL, no wheezing, rales or other adventitious sounds auscultated Abdominal:     Palpations: Abdomen is soft.     Tenderness: There is no abdominal tenderness.     Comments: Soft, nondistended, nontender to light and evaluation throughout entire abdomen, no CVA tenderness  Skin:    General: Skin is warm and dry.  Neurological:     Mental Status: She is alert.      UC Treatments / Results  Labs (all labs ordered are listed, but only abnormal results are displayed) Labs Reviewed  NOVEL CORONAVIRUS, NAA (HOSP ORDER, SEND-OUT TO REF LAB; TAT 18-24 HRS)  POCT URINALYSIS DIP (DEVICE)    EKG   Radiology No results found.  Procedures Procedures (including critical care time)  Medications Ordered in UC Medications - No data to display  Initial Impression / Assessment and Plan / UC Course  I have reviewed the triage vital signs and the nursing notes.  Pertinent labs & imaging results that were available during my care of the patient were reviewed by me and considered in my medical decision making (see chart for details).     UA unremarkable, recommending to drink and push fluids, likely concentration contributing to odor.  Cough x1 week.  Given persistence of symptoms without improvement, not improving with OTC meds will go ahead and cover for bronchitis given patient's reported symptoms.   Providing azithromycin, prednisone and albuterol.  Also providing Tessalon and cetirizine.  May also be viral and may need more time, COVID swab pending.  Discussed strict return precautions. Patient verbalized understanding and is agreeable with plan.  Final Clinical Impressions(s) / UC Diagnoses   Final diagnoses:  Lower resp. tract infection  Abnormal urine odor     Discharge Instructions     Your urine was completely normal, no signs of infection. Please drink plenty of fluids  Please begin daily cetirizine to help with nasal congestion/drainage Tessalon as needed for cough every 8 hours Albuterol inhaler every 4-6 hours for shortness of breath, chest tightness Daily prednisone to help decrease inflammation congestion within sinuses and chest, take with food and in the morning if you are able Begin azithromycin-2 tablets today, 1 tablet for the following 4 days  Please continue to rest and drink plenty of fluids We will call if COVID swab positive Follow-up if symptoms changing, worsening, developing increased shortness of breath, difficulty breathing, chest pain, persistent symptoms   ED Prescriptions    Medication Sig Dispense Auth. Provider   predniSONE (DELTASONE) 50 MG tablet Take 1 tablet (50 mg total) by mouth daily for 5 days. 5 tablet Laelyn Blumenthal C, PA-C   albuterol (VENTOLIN HFA) 108 (90 Base) MCG/ACT inhaler Inhale 1-2 puffs into the lungs every 6 (six) hours as needed for wheezing or shortness of breath. 8 g Jermani Eberlein C, PA-C   azithromycin (ZITHROMAX) 250 MG tablet Take 1 tablet (250 mg total) by mouth daily. Take first 2 tablets together, then 1 every day until finished. 6 tablet Sipriano Fendley C, PA-C   benzonatate (TESSALON) 200 MG capsule Take 1  capsule (200 mg total) by mouth 3 (three) times daily as needed for up to 7 days for cough. 28 capsule Aislyn Hayse C, PA-C   Cetirizine HCl 10 MG CAPS Take 1 capsule (10 mg total) by mouth daily. 15  capsule Kasra Melvin, [place] C, PA-C     PDMP not reviewed this encounter.   Sachi Boulay, [place] C, PA-C 06/18/19 1021

## 2019-06-18 NOTE — ED Triage Notes (Signed)
Pt presents with non productive cough, nasal congestion X 7 days; pt also presents with urinary tract symptoms; foul odor and discomfort.

## 2019-06-20 LAB — NOVEL CORONAVIRUS, NAA (HOSP ORDER, SEND-OUT TO REF LAB; TAT 18-24 HRS): SARS-CoV-2, NAA: NOT DETECTED

## 2019-10-14 ENCOUNTER — Other Ambulatory Visit: Payer: Self-pay

## 2019-10-14 ENCOUNTER — Encounter (HOSPITAL_COMMUNITY): Payer: Self-pay | Admitting: Emergency Medicine

## 2019-10-14 ENCOUNTER — Emergency Department (HOSPITAL_COMMUNITY): Payer: Medicaid Other

## 2019-10-14 ENCOUNTER — Emergency Department (HOSPITAL_COMMUNITY)
Admission: EM | Admit: 2019-10-14 | Discharge: 2019-10-14 | Disposition: A | Discharge status: Home / Self Care | Payer: Medicaid Other | Attending: Emergency Medicine | Admitting: Emergency Medicine

## 2019-10-14 DIAGNOSIS — F17200 Nicotine dependence, unspecified, uncomplicated: Secondary | ICD-10-CM | POA: Diagnosis not present

## 2019-10-14 DIAGNOSIS — Z20822 Contact with and (suspected) exposure to covid-19: Secondary | ICD-10-CM | POA: Insufficient documentation

## 2019-10-14 DIAGNOSIS — J069 Acute upper respiratory infection, unspecified: Secondary | ICD-10-CM | POA: Diagnosis not present

## 2019-10-14 DIAGNOSIS — R05 Cough: Secondary | ICD-10-CM | POA: Diagnosis present

## 2019-10-14 LAB — CBC WITH DIFFERENTIAL/PLATELET
Abs Immature Granulocytes: 0.02 10*3/uL (ref 0.00–0.07)
Basophils Absolute: 0 10*3/uL (ref 0.0–0.1)
Basophils Relative: 0 %
Eosinophils Absolute: 0 10*3/uL (ref 0.0–0.5)
Eosinophils Relative: 0 %
HCT: 37.2 % (ref 36.0–46.0)
Hemoglobin: 11.9 g/dL — ABNORMAL LOW (ref 12.0–15.0)
Immature Granulocytes: 0 %
Lymphocytes Relative: 35 %
Lymphs Abs: 2.1 10*3/uL (ref 0.7–4.0)
MCH: 26.6 pg (ref 26.0–34.0)
MCHC: 32 g/dL (ref 30.0–36.0)
MCV: 83 fL (ref 80.0–100.0)
Monocytes Absolute: 0.4 10*3/uL (ref 0.1–1.0)
Monocytes Relative: 7 %
Neutro Abs: 3.4 10*3/uL (ref 1.7–7.7)
Neutrophils Relative %: 58 %
Platelets: 235 10*3/uL (ref 150–400)
RBC: 4.48 MIL/uL (ref 3.87–5.11)
RDW: 14.2 % (ref 11.5–15.5)
WBC: 5.9 10*3/uL (ref 4.0–10.5)
nRBC: 0 % (ref 0.0–0.2)

## 2019-10-14 LAB — SARS CORONAVIRUS 2 (TAT 6-24 HRS): SARS Coronavirus 2: NEGATIVE

## 2019-10-14 LAB — URINALYSIS, ROUTINE W REFLEX MICROSCOPIC
Bilirubin Urine: NEGATIVE
Glucose, UA: NEGATIVE mg/dL
Hgb urine dipstick: NEGATIVE
Ketones, ur: 20 mg/dL — AB
Leukocytes,Ua: NEGATIVE
Nitrite: NEGATIVE
Protein, ur: NEGATIVE mg/dL
Specific Gravity, Urine: 1.014 (ref 1.005–1.030)
pH: 6 (ref 5.0–8.0)

## 2019-10-14 LAB — COMPREHENSIVE METABOLIC PANEL
ALT: 23 U/L (ref 0–44)
AST: 23 U/L (ref 15–41)
Albumin: 3.4 g/dL — ABNORMAL LOW (ref 3.5–5.0)
Alkaline Phosphatase: 64 U/L (ref 38–126)
Anion gap: 10 (ref 5–15)
BUN: 6 mg/dL (ref 6–20)
CO2: 26 mmol/L (ref 22–32)
Calcium: 8.7 mg/dL — ABNORMAL LOW (ref 8.9–10.3)
Chloride: 105 mmol/L (ref 98–111)
Creatinine, Ser: 0.74 mg/dL (ref 0.44–1.00)
GFR calc Af Amer: >60 mL/min (ref >60)
GFR calc non Af Amer: >60 mL/min (ref >60)
Glucose, Bld: 91 mg/dL (ref 70–99)
Potassium: 3.4 mmol/L — ABNORMAL LOW (ref 3.5–5.1)
Sodium: 141 mmol/L (ref 135–145)
Total Bilirubin: 0.5 mg/dL (ref 0.3–1.2)
Total Protein: 7.7 g/dL (ref 6.5–8.1)

## 2019-10-14 LAB — I-STAT BETA HCG BLOOD, ED (MC, WL, AP ONLY): I-stat hCG, quantitative: <5 m[IU]/mL (ref <5)

## 2019-10-14 MED ORDER — ACETAMINOPHEN 500 MG PO TABS: 500.0000 mg | ORAL_TABLET | Freq: Four times a day (QID) | ORAL | 0 refills | Status: DC | PRN

## 2019-10-14 MED ORDER — SODIUM CHLORIDE 0.9 % IV BOLUS
500.0000 mL | Freq: Once | INTRAVENOUS | Status: AC
  Administered 2019-10-14: 15:00:00 500 mL via INTRAVENOUS

## 2019-10-14 MED ORDER — BENZONATATE 100 MG PO CAPS
200.0000 mg | ORAL_CAPSULE | Freq: Once | ORAL | Status: AC
  Administered 2019-10-14: 200 mg via ORAL
  Filled 2019-10-14: qty 2

## 2019-10-14 MED ORDER — NAPROXEN 500 MG PO TABS: 500.0000 mg | ORAL_TABLET | Freq: Two times a day (BID) | ORAL | 0 refills | Status: DC | PRN

## 2019-10-14 MED ORDER — ACETAMINOPHEN 325 MG PO TABS
650.0000 mg | ORAL_TABLET | Freq: Once | ORAL | Status: AC
  Administered 2019-10-14: 650 mg via ORAL
  Filled 2019-10-14: qty 2

## 2019-10-14 MED ORDER — BENZONATATE 100 MG PO CAPS: 100.0000 mg | ORAL_CAPSULE | Freq: Three times a day (TID) | ORAL | 0 refills | Status: AC | PRN

## 2019-10-14 MED ORDER — ONDANSETRON 4 MG PO TBDP: 4.0000 mg | ORAL_TABLET | Freq: Three times a day (TID) | ORAL | 0 refills | Status: AC | PRN

## 2019-10-14 MED ORDER — ONDANSETRON HCL 4 MG/2ML IJ SOLN
4.0000 mg | Freq: Once | INTRAMUSCULAR | Status: AC
  Administered 2019-10-14: 15:00:00 4 mg via INTRAVENOUS
  Filled 2019-10-14: qty 2

## 2019-10-14 MED ORDER — POTASSIUM CHLORIDE CRYS ER 20 MEQ PO TBCR
20.0000 meq | EXTENDED_RELEASE_TABLET | Freq: Once | ORAL | Status: AC
  Administered 2019-10-14: 20 meq via ORAL
  Filled 2019-10-14: qty 1

## 2019-10-14 NOTE — ED Provider Notes (Signed)
[facility] DEPT Provider Note   CSN: [id number] Arrival date & time: 10/14/19  1215     History Chief Complaint  Patient presents with  . Cough  . Generalized Body Aches    Linda Odom is a 39 y.o. female with no significant PMH presents to the ED with a 10-day history of progressively worsening flu-like symptoms.  Patient reports subjective fevers and chills, generalized body aches, headache, cough, dyspnea on exertion, chest pain with cough, loose stools, intermittent nausea and nonbloody emesis, diminished appetite, and loss of smell/taste.  She denies any dizziness, difficulty breathing, numbness or tingling, constant chest pain, abdominal pain, blurred vision, or other neurologic symptoms.  She states that she was tested for COVID-19 by her primary care provider at [facility] and was negative.  She also reports that she had a chest x-ray obtained, however is unclear of the findings and is concerned for pneumonia.  HPI     Past Medical History:  Diagnosis Date  . Arthritis   . Breast nodule   . Gastric ulcer   . No pertinent past medical history     Patient Active Problem List   Diagnosis Date Noted  . Acute appendicitis 11/13/2013  . Acute appendicitis, unspecified acute appendicitis type 11/13/2013    Past Surgical History:  Procedure Laterality Date  . CHOLECYSTECTOMY    . LAPAROSCOPIC APPENDECTOMY N/A 11/13/2013   Procedure: LAP ASSISTED PARTIAL CECECTOMY;  Surgeon: Odis Hollingshead, MD;  Location: [facility];  Service: General;  Laterality: N/A;  . leg sugery  1994  . TUBAL LIGATION  2006     OB History    Gravida  5   Para  3   Term  3   Preterm      AB  2   Living  3     SAB  2   TAB      Ectopic      Multiple      Live Births              Family History  Problem Relation Age of Onset  . Hypertension Mother   . Diabetes Mother   . Hypertension Maternal Grandmother   . Other Neg Hx      Social History   Tobacco Use  . Smoking status: Current Some Day Smoker    Packs/day: 17.00  . Smokeless tobacco: Never Used  Substance Use Topics  . Alcohol use: Yes    Comment: occ  . Drug use: No    Home Medications Prior to Admission medications   Medication Sig Start Date End Date Taking? Authorizing Provider  albuterol (VENTOLIN HFA) 108 (90 Base) MCG/ACT inhaler Inhale 1-2 puffs into the lungs every 6 (six) hours as needed for wheezing or shortness of breath. 06/18/19  Yes Wieters, Hallie C, PA-C  furosemide (LASIX) 20 MG tablet Take 20 mg by mouth daily as needed for fluid.  09/21/19  Yes [provider]  levocetirizine (XYZAL) 5 MG tablet Take 5 mg by mouth at bedtime. 10/06/19  Yes [provider]  omeprazole (PRILOSEC) 20 MG capsule Take 20 mg by mouth 2 (two) times daily. 09/21/19  Yes [provider]  phentermine (ADIPEX-P) 37.5 MG tablet Take 37.5 mg by mouth daily before breakfast.  05/04/19  Yes [provider]  traMADol (ULTRAM) 50 MG tablet Take 50 mg by mouth every 6 (six) hours as needed for moderate pain.  08/04/18  Yes [provider]  Vitamin D, Ergocalciferol, (DRISDOL) 1.25 MG (50000 UNIT) CAPS capsule Take 50,000 Units by mouth once a week. 10/06/19  Yes [provider]  acetaminophen (TYLENOL) 500 MG tablet Take 1 tablet (500 mg total) by mouth every 6 (six) hours as needed. 10/14/19   Corena Herter, PA-C  azithromycin (ZITHROMAX) 250 MG tablet Take 1 tablet (250 mg total) by mouth daily. Take first 2 tablets together, then 1 every day until finished. Patient not taking: Reported on 10/14/2019 06/18/19   Wieters, Hallie C, PA-C  benzonatate (TESSALON) 100 MG capsule Take 1 capsule (100 mg total) by mouth 3 (three) times daily as needed for cough. 10/14/19   Corena Herter, PA-C  Cetirizine HCl 10 MG CAPS Take 1 capsule (10 mg total) by mouth daily. Patient not taking: Reported on 10/14/2019 06/18/19   Wieters, Madelynn Done C,  PA-C  diclofenac (VOLTAREN) 75 MG EC tablet Take 1 tablet (75 mg total) by mouth 2 (two) times daily. Patient not taking: Reported on 10/14/2019 08/12/18   Pete Pelt, PA-C  naproxen (NAPROSYN) 500 MG tablet Take 1 tablet (500 mg total) by mouth 2 (two) times daily between meals as needed (fevers, chills, body aches). 10/14/19   Corena Herter, PA-C  ondansetron (ZOFRAN ODT) 4 MG disintegrating tablet Take 1 tablet (4 mg total) by mouth every 8 (eight) hours as needed for nausea or vomiting. 10/14/19   Corena Herter, PA-C    Allergies    Patient has no known allergies.  Review of Systems   Review of Systems  All other systems reviewed and are negative.    Physical Exam Updated Vital Signs BP 95/61   Pulse 92   Temp 99.9 F (37.7 C) (Oral)   Resp 18   LMP 10/02/2019   SpO2 97%   Physical Exam Vitals and nursing note reviewed. Exam conducted with a chaperone present.  Constitutional:      Appearance: Normal appearance.  HENT:     Head: Normocephalic and atraumatic.  Eyes:     General: No scleral icterus.    Conjunctiva/sclera: Conjunctivae normal.  Cardiovascular:     Rate and Rhythm: Normal rate and regular rhythm.     Pulses: Normal pulses.     Heart sounds: Normal heart sounds.  Pulmonary:     Comments: No increased work of breathing.  Breath sounds intact bilaterally.  No accessory muscle use.  Normal breath sounds. Abdominal:     General: Abdomen is flat. There is no distension.     Palpations: Abdomen is soft.     Tenderness: There is no abdominal tenderness. There is no guarding.  Musculoskeletal:     Cervical back: Normal range of motion and neck supple. No rigidity.  Skin:    General: Skin is dry.     Capillary Refill: Capillary refill takes less than 2 seconds.  Neurological:     General: No focal deficit present.     Mental Status: She is alert and oriented to person, place, and time.     GCS: GCS eye subscore is 4. GCS verbal subscore is 5. GCS motor  subscore is 6.  Psychiatric:        Mood and Affect: Mood normal.        Behavior: Behavior normal.        Thought Content: Thought content normal.     ED Results / Procedures / Treatments   Labs (all labs ordered are listed, but only abnormal results are displayed) Labs Reviewed  CBC WITH DIFFERENTIAL/PLATELET - Abnormal; Notable for the following components:      Result Value   Hemoglobin 11.9 (*)    All other components within normal limits  COMPREHENSIVE METABOLIC PANEL - Abnormal; Notable for the following components:   Potassium 3.4 (*)    Calcium 8.7 (*)    Albumin 3.4 (*)    All other components within normal limits  URINALYSIS, ROUTINE W REFLEX MICROSCOPIC - Abnormal; Notable for the following components:   Ketones, ur 20 (*)    All other components within normal limits  SARS CORONAVIRUS 2 (TAT 6-24 HRS)  I-STAT BETA HCG BLOOD, ED ([facility], [facility], [facility] ONLY)    EKG None  Radiology DG Chest Portable 1 View  Result Date: 10/14/2019 CLINICAL DATA:  Cough, shortness of breath, and body aches for the past 3 days. EXAM: PORTABLE CHEST 1 VIEW COMPARISON:  Chest x-ray dated February 09, 2017. FINDINGS: The heart size and mediastinal contours are within normal limits. Normal pulmonary vascularity. Mild patchy opacity at the right greater than left lung bases and peripheral right mid lung. No pleural effusion or pneumothorax. No acute osseous abnormality. IMPRESSION: Mild bilateral peripheral and basilar predominant patchy airspace disease, nonspecific, but concerning for atypical infection/viral pneumonia given clinical history. Electronically Signed   By: Titus Dubin M.D.   On: 10/14/2019 15:01    Procedures Procedures (including critical care time)  Medications Ordered in ED Medications  sodium chloride 0.9 % bolus 500 mL (0 mLs Intravenous Stopped 10/14/19 1647)  ondansetron (ZOFRAN) injection 4 mg (4 mg Intravenous Given 10/14/19 1521)  acetaminophen (TYLENOL) tablet 650 mg (650 mg  Oral Given 10/14/19 1528)  potassium chloride SA (KLOR-CON) CR tablet 20 mEq (20 mEq Oral Given 10/14/19 1736)    ED Course  I have reviewed the triage vital signs and the nursing notes.  Pertinent labs & imaging results that were available during my care of the patient were reviewed by me and considered in my medical decision making (see chart for details).    MDM Rules/Calculators/A&P                      DG chest demonstrates mild bilateral patchy airspace disease concerning for follow pneumonia.  Recommend patient follow isolation precautions while COVID-19 testing pending.  Despite reported inability to drink and 10 pound weight loss, her electrolytes and lab work is reassuring.  Patient is only mildly hypokalemic to 3.4, encouraging patient to consume potatoes and bananas.  She will need to have her labs rechecked in few days to ensure correction of her mild electrolyte derangement.  P.o. challenged patient with 20 mEq potassium replacement.  She was able to tolerate without issue.  Given patient's reported symptoms, I have low suspicion for a bacterial pneumonia and do not feel as though antibiotics are warranted at this time.  Discussed with patient my suspicion for COVID-19.  She is in agreement.  Will prescribe Tessalon Perles, naproxen, Tylenol, and ODT Zofran for symptomatic relief at home.  Return precautions discussed.  All of the evaluation and work-up results were discussed with the patient and any family at bedside. They were provided opportunity to ask any additional questions and have none at this time. They have expressed understanding of verbal discharge instructions as well as return precautions and are agreeable to the plan.    Final Clinical Impression(s) / ED Diagnoses Final diagnoses:  Viral URI with cough    Rx / DC Orders ED Discharge Orders  Ordered    acetaminophen (TYLENOL) 500 MG tablet  Every 6 hours PRN     10/14/19 1702    naproxen (NAPROSYN) 500 MG  tablet  2 times daily between meals PRN     10/14/19 1702    ondansetron (ZOFRAN ODT) 4 MG disintegrating tablet  Every 8 hours PRN     10/14/19 1702    benzonatate (TESSALON) 100 MG capsule  3 times daily PRN     10/14/19 1702           Corena Herter, PA-C 10/14/19 1739    Carmin Muskrat, MD 10/20/19 [phone]

## 2019-10-14 NOTE — Discharge Instructions (Addendum)
Please take your medications, as prescribed.  Do not take any other NSAIDs in addition to the naproxen that I prescribed you.    Please continue with your other conservative therapies for relief of your flu-like symptoms.  Please continue to eat regular meals and increase your oral hydration to avoid dehydration.  Please read the attachment on COVID-19.  It is imperative that you continue to follow isolation precautions pending results of your COVID-19 testing.  Please return to the ED or seek immediate medical attention for any new or worsening symptoms.

## 2019-10-14 NOTE — ED Notes (Signed)
Urine sample sent to lab.  Due to very small amount of urine, culture was not sent down.

## 2019-10-14 NOTE — ED Triage Notes (Signed)
Pt reports since last week has cough, congestion, body aches, fever. Had covid test which was negative. Saw pcp Monday and had chest xray. Was dx with URI but still not feeling well.

## 2019-10-14 NOTE — ED Notes (Signed)
Pt ambulatory to restroom

## 2020-03-21 ENCOUNTER — Ambulatory Visit: Payer: Medicaid Other | Admitting: Sports Medicine

## 2020-12-07 LAB — LIPID PANEL
Cholesterol: 204 — AB (ref 0–200)
HDL: 46 (ref 35–70)
LDL Cholesterol: 142
LDl/HDL Ratio: 3.1
Triglycerides: 76 (ref 40–160)

## 2020-12-07 LAB — COMPREHENSIVE METABOLIC PANEL
Albumin: 4.1 (ref 3.5–5.0)
Calcium: 9.4 (ref 8.7–10.7)

## 2020-12-07 LAB — CBC: RBC: 4.41 (ref 3.87–5.11)

## 2020-12-07 LAB — CBC AND DIFFERENTIAL
HCT: 38 (ref 36–46)
Hemoglobin: 12.5 (ref 12.0–16.0)
Platelets: 252 10*3/uL (ref 150–400)
WBC: 5.4

## 2020-12-07 LAB — TSH: TSH: 1.21 (ref 0.41–5.90)

## 2020-12-07 LAB — BASIC METABOLIC PANEL
BUN: 12 (ref 4–21)
CO2: 21 (ref 13–22)
Chloride: 104 (ref 99–108)
Creatinine: 0.8 (ref 0.5–1.1)
Glucose: 109
Potassium: 4.6 mEq/L (ref 3.5–5.1)
Sodium: 140 (ref 137–147)

## 2020-12-07 LAB — HEPATIC FUNCTION PANEL
ALT: 20 U/L (ref 7–35)
AST: 19 (ref 13–35)
Alkaline Phosphatase: 70 (ref 25–125)
Bilirubin, Total: 0.4

## 2020-12-07 LAB — VITAMIN D 25 HYDROXY (VIT D DEFICIENCY, FRACTURES): Vit D, 25-Hydroxy: 20.7

## 2021-12-19 ENCOUNTER — Ambulatory Visit: Payer: Medicaid Other | Admitting: Podiatry

## 2021-12-27 ENCOUNTER — Ambulatory Visit: Payer: Medicaid Other | Admitting: Sports Medicine

## 2021-12-27 ENCOUNTER — Ambulatory Visit (INDEPENDENT_AMBULATORY_CARE_PROVIDER_SITE_OTHER): Payer: Medicaid Other | Admitting: Bariatrics

## 2021-12-27 ENCOUNTER — Encounter (INDEPENDENT_AMBULATORY_CARE_PROVIDER_SITE_OTHER): Payer: Self-pay | Admitting: Bariatrics

## 2021-12-27 VITALS — BP 113/78 | HR 67 | Temp 98.0°F | Ht 64.0 in | Wt 223.0 lb

## 2021-12-27 DIAGNOSIS — E559 Vitamin D deficiency, unspecified: Secondary | ICD-10-CM

## 2021-12-27 DIAGNOSIS — Z1331 Encounter for screening for depression: Secondary | ICD-10-CM | POA: Diagnosis not present

## 2021-12-27 DIAGNOSIS — E6609 Other obesity due to excess calories: Secondary | ICD-10-CM

## 2021-12-27 DIAGNOSIS — Z0000 Encounter for general adult medical examination without abnormal findings: Secondary | ICD-10-CM

## 2021-12-27 DIAGNOSIS — M1990 Unspecified osteoarthritis, unspecified site: Secondary | ICD-10-CM

## 2021-12-27 DIAGNOSIS — R0602 Shortness of breath: Secondary | ICD-10-CM

## 2021-12-27 DIAGNOSIS — R5383 Other fatigue: Secondary | ICD-10-CM | POA: Diagnosis not present

## 2021-12-27 DIAGNOSIS — Z6838 Body mass index (BMI) 38.0-38.9, adult: Secondary | ICD-10-CM

## 2021-12-27 DIAGNOSIS — K219 Gastro-esophageal reflux disease without esophagitis: Secondary | ICD-10-CM

## 2021-12-27 DIAGNOSIS — E66812 Obesity, class 2: Secondary | ICD-10-CM

## 2021-12-28 LAB — COMPREHENSIVE METABOLIC PANEL
ALT: 19 IU/L (ref 0–32)
AST: 15 IU/L (ref 0–40)
Albumin/Globulin Ratio: 1.2 (ref 1.2–2.2)
Albumin: 3.9 g/dL (ref 3.8–4.8)
Alkaline Phosphatase: 67 IU/L (ref 44–121)
BUN/Creatinine Ratio: 12 (ref 9–23)
BUN: 10 mg/dL (ref 6–24)
Bilirubin Total: <0.2 mg/dL (ref 0.0–1.2)
CO2: 22 mmol/L (ref 20–29)
Calcium: 9.4 mg/dL (ref 8.7–10.2)
Chloride: 108 mmol/L — ABNORMAL HIGH (ref 96–106)
Creatinine, Ser: 0.84 mg/dL (ref 0.57–1.00)
Globulin, Total: 3.2 g/dL (ref 1.5–4.5)
Glucose: 83 mg/dL (ref 70–99)
Potassium: 4.7 mmol/L (ref 3.5–5.2)
Sodium: 142 mmol/L (ref 134–144)
Total Protein: 7.1 g/dL (ref 6.0–8.5)
eGFR: 89 mL/min/{1.73_m2} (ref >59)

## 2021-12-28 LAB — VITAMIN D 25 HYDROXY (VIT D DEFICIENCY, FRACTURES): Vit D, 25-Hydroxy: 49.1 ng/mL (ref 30.0–100.0)

## 2021-12-28 LAB — TSH+T4F+T3FREE
Free T4: 1.09 ng/dL (ref 0.82–1.77)
T3, Free: 2.5 pg/mL (ref 2.0–4.4)
TSH: 0.943 u[IU]/mL (ref 0.450–4.500)

## 2021-12-28 LAB — LIPID PANEL WITH LDL/HDL RATIO
Cholesterol, Total: 212 mg/dL — ABNORMAL HIGH (ref 100–199)
HDL: 56 mg/dL (ref >39)
LDL Chol Calc (NIH): 146 mg/dL — ABNORMAL HIGH (ref 0–99)
LDL/HDL Ratio: 2.6 ratio (ref 0.0–3.2)
Triglycerides: 55 mg/dL (ref 0–149)
VLDL Cholesterol Cal: 10 mg/dL (ref 5–40)

## 2021-12-28 LAB — INSULIN, RANDOM: INSULIN: 13.6 u[IU]/mL (ref 2.6–24.9)

## 2021-12-28 LAB — HEMOGLOBIN A1C
Est. average glucose Bld gHb Est-mCnc: 100 mg/dL
Hgb A1c MFr Bld: 5.1 % (ref 4.8–5.6)

## 2021-12-31 ENCOUNTER — Encounter (INDEPENDENT_AMBULATORY_CARE_PROVIDER_SITE_OTHER): Payer: Self-pay | Admitting: Bariatrics

## 2021-12-31 DIAGNOSIS — E7800 Pure hypercholesterolemia, unspecified: Secondary | ICD-10-CM | POA: Insufficient documentation

## 2022-01-09 ENCOUNTER — Encounter (INDEPENDENT_AMBULATORY_CARE_PROVIDER_SITE_OTHER): Payer: Self-pay | Admitting: Bariatrics

## 2022-01-09 DIAGNOSIS — E6609 Other obesity due to excess calories: Secondary | ICD-10-CM | POA: Insufficient documentation

## 2022-01-09 NOTE — Progress Notes (Signed)
? ? ? ?Chief Complaint:  ? ?OBESITY ?Linda Odom (MR# [id number]) is a 41 y.o. female who presents for evaluation and treatment of obesity and related comorbidities. Current BMI is Body mass index is 38.28 kg/m?Marland Kitchen Linda Odom has been struggling with her weight for many years and has been unsuccessful in either losing weight, maintaining weight loss, or reaching her healthy weight goal. ? ?Linda Odom skips meals at times. She craves carbohydrates doing her cycle.  ? ?Linda Odom is currently in the action stage of change and ready to dedicate time achieving and maintaining a healthier weight. Linda Odom is interested in becoming our patient and working on intensive lifestyle modifications including (but not limited to) diet and exercise for weight loss. ? ?Linda Odom's habits were reviewed today and are as follows: Her family eats meals together, she thinks her family will eat healthier with her, her desired weight loss is 63 pounds, she has been heavy most of her life, she started gaining weight at the age of 20, her heaviest weight ever was 271 pounds, she has significant food cravings issues, she snacks frequently in the evenings, she wakes up frequently in the middle of the night to eat, she skips meals frequently, she is trying to follow a vegetarian diet, she is trying to follow a vegan diet, she is frequently drinking liquids with calories, she frequently makes poor food choices, she has problems with excessive hunger, she frequently eats larger portions than normal, and she struggles with emotional eating. ? ?Depression Screen ?Linda Odom's Food and Mood (modified PHQ-9) score was 7. ? ? ?  12/27/2021  ?  7:57 AM  ?Depression screen PHQ 2/9  ?Decreased Interest 0  ?Down, Depressed, Hopeless 1  ?PHQ - 2 Score 1  ?Altered sleeping 1  ?Tired, decreased energy 1  ?Change in appetite 1  ?Feeling bad or failure about yourself  1  ?Trouble concentrating 1  ?Moving slowly or fidgety/restless 1  ?Suicidal thoughts 0  ?PHQ-9 Score 7   ?Difficult doing work/chores Somewhat difficult  ? ?Subjective:  ? ?1. Other fatigue ?Linda Odom admits to daytime somnolence and admits to waking up still tired. Patient has a history of symptoms of daytime fatigue, morning fatigue, and morning headache. Linda Odom generally gets 5 or 6 hours of sleep per night, and states that she has difficulty falling back asleep if awakened and generally restful sleep. Snoring is present. Apneic episodes are not present. Epworth Sleepiness Score is 12.  Linda Odom will continue activities.  ? ?2. SOB (shortness of breath) on exertion ?Linda Odom notes increasing shortness of breath with exercising and seems to be worsening over time with weight gain. She notes getting out of breath sooner with activity than she used to. This has not gotten worse recently. Linda Odom denies shortness of breath at rest or orthopnea.  ? ?3. Vitamin D deficiency ?Linda Odom is taking high dose Vitamin D.  ? ?4. Other type of osteoarthritis, unspecified site ?Linda Odom notes osteoarthritis in both knees bilateral.  ? ?5. Gastroesophageal reflux disease without esophagitis ?Linda Odom is taking Prilosec currently.  ? ?6. Healthcare maintenance ?We will do labs.  ? ?Assessment/Plan:  ? ?1. Other fatigue ?Linda Odom does feel that her weight is causing her energy to be lower than it should be. Fatigue may be related to obesity, depression or many other causes. Labs will be ordered, and in the meanwhile, Linda Odom will focus on self care including making healthy food choices, increasing physical activity and focusing on stress reduction. Linda Odom will gradually increase activities and exercise. We will check  TSH and review EKG today.  ? ?- EKG 12-Lead ?- TSH+T4F+T3Free ? ?2. SOB (shortness of breath) on exertion  ?Linda Odom does feel that she gets out of breath more easily that she used to when she exercises. Linda Odom's shortness of breath appears to be obesity related and exercise induced. She has agreed to work on weight loss and  gradually increase exercise to treat her exercise induced shortness of breath. Will continue to monitor closely.  ? ?- TSH+T4F+T3Free ? ?3. Vitamin D deficiency ?Low Vitamin D level contributes to fatigue and are associated with obesity, breast, and colon cancer. We will check Vitamin D today and Linda Odom will follow-up for routine testing of Vitamin D, at least 2-3 times per year to avoid over-replacement. ? ?- VITAMIN D 25 Hydroxy (Vit-D Deficiency, Fractures) ? ?4. Other type of osteoarthritis, unspecified site ?Linda Odom will gradually increase activities and exercise.  ? ?5. Gastroesophageal reflux disease without esophagitis ?Intensive lifestyle modifications are the first line treatment for this issue. Linda Odom will continue taking Prilosec. We discussed several lifestyle modifications today and she will continue to work on diet, exercise and weight loss efforts. Orders and follow up as documented in patient record.  ? ?Counseling ?If a person has gastroesophageal reflux disease (GERD), food and stomach acid move back up into the esophagus and cause symptoms or problems such as damage to the esophagus. ?Anti-reflux measures include: raising the head of the bed, avoiding tight clothing or belts, avoiding eating late at night, not lying down shortly after mealtime, and achieving weight loss. ?Avoid ASA, NSAID's, caffeine, alcohol, and tobacco.  ?OTC Pepcid and/or Tums are often very helpful for as needed use.  ?However, for persisting chronic or daily symptoms, stronger medications like Omeprazole may be needed. ?You may need to avoid foods and drinks such as: ?Coffee and tea (with or without caffeine). ?Drinks that contain alcohol. ?Energy drinks and sports drinks. ?Bubbly (carbonated) drinks or sodas. ?Chocolate and cocoa. ?Peppermint and mint flavorings. ?Garlic and onions. ?Horseradish. ?Spicy and acidic foods. These include peppers, chili powder, curry powder, vinegar, hot sauces, and BBQ sauce. ?Citrus fruit  juices and citrus fruits, such as oranges, lemons, and limes. ?Tomato-based foods. These include red sauce, chili, salsa, and pizza with red sauce. ?Fried and fatty foods. These include donuts, french fries, potato chips, and high-fat dressings. ?High-fat meats. These include hot dogs, rib eye steak, sausage, ham, and bacon.  ? ?6. Healthcare maintenance ?We will check lipid panel and Vitamin D today.  ? ?- Lipid Panel With LDL/HDL Ratio ?- Insulin, random ?- Hemoglobin A1c ?- Comprehensive metabolic panel ? ?7. Depression screen ?Linda Odom had a positive depression screening. Depression is commonly associated with obesity and often results in emotional eating behaviors. We will monitor this closely and work on CBT to help improve the non-hunger eating patterns. Referral to Psychology may be required if no improvement is seen as she continues in our clinic.  ? ?8. Class 2 severe obesity with serious comorbidity and body mass index (BMI) of 38.0 to 38.9 in adult, unspecified obesity type ([place]) ?Linda Odom is currently in the action stage of change and her goal is to continue with weight loss efforts. I recommend Linda Odom begin the structured treatment plan as follows: ? ?She has agreed to the Category 2 Plan. ? ?Linda Odom will continue eating and she will be mindful eating.  ? ?Exercise goals:  Linda Odom will start doing YouTube exercise.    ? ?Behavioral modification strategies: increasing lean protein intake, decreasing simple carbohydrates, increasing vegetables,  increasing water intake, decreasing eating out, no skipping meals, meal planning and cooking strategies, keeping healthy foods in the home, and planning for success. ? ?She was informed of the importance of frequent follow-up visits to maximize her success with intensive lifestyle modifications for her multiple health conditions. She was informed we would discuss her lab results at her next visit unless there is a critical issue that needs to be addressed sooner.  Linda Odom agreed to keep her next visit at the agreed upon time to discuss these results. ? ?Objective:  ? ?Blood pressure 113/78, pulse 67, temperature 98 ?F (36.7 ?C), height '5\' 4"'$  (1.626 m), weight 223 lb

## 2022-01-10 ENCOUNTER — Ambulatory Visit (INDEPENDENT_AMBULATORY_CARE_PROVIDER_SITE_OTHER): Payer: Medicaid Other | Admitting: Bariatrics

## 2022-01-10 ENCOUNTER — Encounter (INDEPENDENT_AMBULATORY_CARE_PROVIDER_SITE_OTHER): Payer: Self-pay | Admitting: Bariatrics

## 2022-01-10 VITALS — BP 114/80 | HR 64 | Temp 97.9°F | Ht 64.0 in | Wt 223.0 lb

## 2022-01-10 DIAGNOSIS — E8881 Metabolic syndrome: Secondary | ICD-10-CM | POA: Diagnosis not present

## 2022-01-10 DIAGNOSIS — F5089 Other specified eating disorder: Secondary | ICD-10-CM

## 2022-01-10 DIAGNOSIS — E66812 Obesity, class 2: Secondary | ICD-10-CM

## 2022-01-10 DIAGNOSIS — Z6838 Body mass index (BMI) 38.0-38.9, adult: Secondary | ICD-10-CM

## 2022-01-10 DIAGNOSIS — E669 Obesity, unspecified: Secondary | ICD-10-CM | POA: Diagnosis not present

## 2022-01-10 DIAGNOSIS — E7800 Pure hypercholesterolemia, unspecified: Secondary | ICD-10-CM | POA: Diagnosis not present

## 2022-01-10 DIAGNOSIS — E88819 Insulin resistance, unspecified: Secondary | ICD-10-CM

## 2022-01-10 MED ORDER — BUPROPION HCL ER (SR) 150 MG PO TB12: 150.0000 mg | ORAL_TABLET | Freq: Every day | ORAL | 0 refills | Status: DC

## 2022-01-16 NOTE — Progress Notes (Signed)
Chief Complaint:   OBESITY Linda Odom is here to discuss her progress with her obesity treatment plan along with follow-up of her obesity related diagnoses. Linda Odom is on the Category 2 Plan and states she is following her eating plan approximately 50% of the time. Linda Odom states she is walking and going to the gym for 60 minutes 3 times per week.  Today's visit was #: 2 Starting weight: 223 lbs Starting date: 12/27/2021 Today's weight: 223 lbs Today's date: 01/10/2022 Total lbs lost to date: 0 Total lbs lost since last in-office visit: 0  Interim History: Dazha's weight remains the same as her first visit. She has struggled with cravings for sweets.   Subjective:   1. Insulin resistance Linda Odom is not on medications currently. Her last A1C was 5.1 and  insulin was 13.6.   2. Elevated cholesterol Linda Odom total cholesterol 21.2. Her LDL was 146.  3. Other disorder of eating Linda Odom notes cravings all day.   Assessment/Plan:   1. Insulin resistance Linda Odom will decrease all carbohydrates (sugar and starches). She will continue to work on weight loss, exercise, and decreasing simple carbohydrates to help decrease the risk of diabetes. Linda Odom agreed to follow-up with Linda Odom as directed to closely monitor her progress.  2. Elevated cholesterol Cardiovascular risk and specific lipid/LDL goals reviewed.  Linda Odom will have no trans fats. She will minimize saturated fats to dairy and unprocessed meats. We discussed several lifestyle modifications today and Linda Odom will continue to work on diet, exercise and weight loss efforts. Orders and follow up as documented in patient record.   Counseling Intensive lifestyle modifications are the first line treatment for this issue. Dietary changes: Increase soluble fiber. Decrease simple carbohydrates. Exercise changes: Moderate to vigorous-intensity aerobic activity 150 minutes per week if tolerated. Lipid-lowering medications: see documented in  medical record.  3. Other disorder of eating Linda Odom will continue Linda Odom 150 mg for 1 month with no refills. She will continue to work on weight loss, exercise, and decreasing simple carbohydrates to help decrease the risk of diabetes.   - Linda Odom (Linda Odom SR) 150 MG 12 hr tablet; Take 1 tablet (150 mg total) by mouth daily.  Dispense: 30 tablet; Refill: 0  4. Obesity, Current BMI 38.3 Linda Odom is currently in the action stage of change. As such, her goal is to continue with weight loss efforts. She has agreed to the Category 2 Plan and keeping a food journal and adhering to recommended goals of 1200 calories and 80 grams of protein.   Linda Odom will continue meal planning. We reviewed labs from 01/02/2022 CMP, Lipid, Vitamin D, A1C, insulin, and thyroid panel. She will increase protein.   Exercise goals:  Linda Odom will continue walking at the park.  Behavioral modification strategies: increasing lean protein intake, decreasing simple carbohydrates, increasing vegetables, increasing water intake, decreasing eating out, no skipping meals, meal planning and cooking strategies, keeping healthy foods in the home, and planning for success.  Linda Odom has agreed to follow-up with our clinic in 2-3 weeks with nurse practitioner and 6 weeks with myself. She was informed of the importance of frequent follow-up visits to maximize her success with intensive lifestyle modifications for her multiple health conditions.   Objective:   Blood pressure 114/80, pulse 64, temperature 97.9 F (36.6 C), height '5\' 4"'$  (1.626 m), SpO2 97 %. Body mass index is 38.28 kg/m.  General: Cooperative, alert, well developed, in no acute distress. HEENT: Conjunctivae and lids unremarkable. Cardiovascular: Regular rhythm.  Lungs: Normal work of breathing.  Neurologic: No focal deficits.   Lab Results  Component Value Date   CREATININE 0.84 12/27/2021   BUN 10 12/27/2021   NA 142 12/27/2021   K 4.7 12/27/2021   CL  108 (H) 12/27/2021   CO2 22 12/27/2021   Lab Results  Component Value Date   ALT 19 12/27/2021   AST 15 12/27/2021   ALKPHOS 67 12/27/2021   BILITOT <0.2 12/27/2021   Lab Results  Component Value Date   HGBA1C 5.1 12/27/2021   Lab Results  Component Value Date   INSULIN 13.6 12/27/2021   Lab Results  Component Value Date   TSH 0.943 12/27/2021   Lab Results  Component Value Date   CHOL 212 (H) 12/27/2021   HDL 56 12/27/2021   LDLCALC 146 (H) 12/27/2021   TRIG 55 12/27/2021   Lab Results  Component Value Date   VD25OH 49.1 12/27/2021   Lab Results  Component Value Date   WBC 5.9 10/14/2019   HGB 11.9 (L) 10/14/2019   HCT 37.2 10/14/2019   MCV 83.0 10/14/2019   PLT 235 10/14/2019   No results found for: IRON, TIBC, FERRITIN  Attestation Statements:   Reviewed by clinician on day of visit: allergies, medications, problem list, medical history, surgical history, family history, social history, and previous encounter notes.  I, Lizbeth Bark, RMA, am acting as Location manager for [employer], DO.  I have reviewed the above documentation for accuracy and completeness, and I agree with the above. Jearld Lesch, DO

## 2022-01-30 ENCOUNTER — Ambulatory Visit (INDEPENDENT_AMBULATORY_CARE_PROVIDER_SITE_OTHER): Payer: Medicaid Other | Admitting: Nurse Practitioner

## 2022-01-30 ENCOUNTER — Encounter (INDEPENDENT_AMBULATORY_CARE_PROVIDER_SITE_OTHER): Payer: Self-pay | Admitting: Bariatrics

## 2022-01-30 ENCOUNTER — Encounter (INDEPENDENT_AMBULATORY_CARE_PROVIDER_SITE_OTHER): Payer: Self-pay | Admitting: Nurse Practitioner

## 2022-01-30 VITALS — BP 106/69 | HR 75 | Temp 97.4°F | Ht 64.0 in | Wt 220.0 lb

## 2022-01-30 DIAGNOSIS — E669 Obesity, unspecified: Secondary | ICD-10-CM

## 2022-01-30 DIAGNOSIS — E7800 Pure hypercholesterolemia, unspecified: Secondary | ICD-10-CM

## 2022-01-30 DIAGNOSIS — Z6837 Body mass index (BMI) 37.0-37.9, adult: Secondary | ICD-10-CM

## 2022-01-30 DIAGNOSIS — F5089 Other specified eating disorder: Secondary | ICD-10-CM | POA: Diagnosis not present

## 2022-01-30 MED ORDER — BUPROPION HCL ER (SR) 200 MG PO TB12: 200.0000 mg | ORAL_TABLET | Freq: Every day | ORAL | 0 refills | Status: AC

## 2022-01-30 NOTE — Patient Instructions (Signed)
The 10-year ASCVD risk score (Arnett DK, et al., 2019) is: 0.6%   Values used to calculate the score:     Age: 41 years     Sex: Female     Is Non-Hispanic African American: Yes     Diabetic: No     Tobacco smoker: Yes     Systolic Blood Pressure: 341 mmHg     Is BP treated: No     HDL Cholesterol: 56 mg/dL     Total Cholesterol: 212 mg/dL

## 2022-02-07 NOTE — Progress Notes (Signed)
Chief Complaint:   OBESITY Linda Odom is here to discuss her progress with her obesity treatment plan along with follow-up of her obesity related diagnoses. Linda Odom is on the Category 2 Plan and states she is following her eating plan approximately 65-75% of the time. Linda Odom states she is exercising 60 minutes 3-4 times per week.  Today's visit was #: 3 Starting weight: 223 lbs Starting date: 12/27/2021 Today's weight: 220 lbs Today's date: 01/30/2022 Total lbs lost to date: 3 lbs Total lbs lost since last in-office visit: 3  Interim History: Linda Odom has done well with wt loss since last visit. She celebrated Mother's Day since last visit. She started 2 new jobs last week and is walking more with her new jobs and feels that this has helped with her weight loss. She is drinking water and occasional lemonade. Plans to stop smoking today. She reports hunger and cravings.  Subjective:   1. Elevated cholesterol Linda Odom has never been on medication for cholesterol.  2. Other disorder of eating Linda Odom is currently taking Wellbutrin SR 150 mg. Denies side effects. She feels that it has been helpful with cravings but still struggling especially in the evenings.   Assessment/Plan:   1. Elevated cholesterol Cardiovascular risk and specific lipid/LDL goals reviewed.  We discussed several lifestyle modifications today and Linda Odom will continue to work on diet, exercise and weight loss efforts. Orders and follow up as documented in patient record.   The 10-year ASCVD risk score (Arnett DK, et al., 2019) is: 0.6%   Values used to calculate the score:     Age: 41 years     Sex: Female     Is Non-Hispanic African American: Yes     Diabetic: No     Tobacco smoker: Yes     Systolic Blood Pressure: 546 mmHg     Is BP treated: No     HDL Cholesterol: 56 mg/dL     Total Cholesterol: 212 mg/dL  Counseling Intensive lifestyle modifications are the first line treatment for this issue. Dietary  changes: Increase soluble fiber. Decrease simple carbohydrates. Exercise changes: Moderate to vigorous-intensity aerobic activity 150 minutes per week if tolerated. Lipid-lowering medications: see documented in medical record.  Reviewed ASCVD with patient.  2. Other disorder of eating We will INCREASE and fill Bupropion 200 mg by mouth daily for 1 month with 0 refills. Side effects discussed. Hopefully will help with cravings and smoking cessation.  -INCREASE buPROPion (WELLBUTRIN SR) 200 MG 12 hr tablet; Take 1 tablet (200 mg total) by mouth daily.  Dispense: 30 tablet; Refill: 0  3. Obesity, Current BMI 37.8 Linda Odom is currently in the action stage of change. As such, her goal is to continue with weight loss efforts. She has agreed to the Category 2 Plan.   Exercise goals: As is.  Behavioral modification strategies: no skipping meals, meal planning and cooking strategies, and planning for success.  Linda Odom has agreed to follow-up with our clinic in 3 weeks. She was informed of the importance of frequent follow-up visits to maximize her success with intensive lifestyle modifications for her multiple health conditions.   Objective:   Blood pressure 106/69, pulse 75, temperature (!) 97.4 F (36.3 C), height '5\' 4"'$  (1.626 m), weight 220 lb (99.8 kg), SpO2 95 %. Body mass index is 37.76 kg/m.  General: Cooperative, alert, well developed, in no acute distress. HEENT: Conjunctivae and lids unremarkable. Cardiovascular: Regular rhythm.  Lungs: Normal work of breathing. Neurologic: No focal deficits.  Lab Results  Component Value Date   CREATININE 0.84 12/27/2021   BUN 10 12/27/2021   NA 142 12/27/2021   K 4.7 12/27/2021   CL 108 (H) 12/27/2021   CO2 22 12/27/2021   Lab Results  Component Value Date   ALT 19 12/27/2021   AST 15 12/27/2021   ALKPHOS 67 12/27/2021   BILITOT <0.2 12/27/2021   Lab Results  Component Value Date   HGBA1C 5.1 12/27/2021   Lab Results   Component Value Date   INSULIN 13.6 12/27/2021   Lab Results  Component Value Date   TSH 0.943 12/27/2021   Lab Results  Component Value Date   CHOL 212 (H) 12/27/2021   HDL 56 12/27/2021   LDLCALC 146 (H) 12/27/2021   TRIG 55 12/27/2021   Lab Results  Component Value Date   VD25OH 49.1 12/27/2021   Lab Results  Component Value Date   WBC 5.9 10/14/2019   HGB 11.9 (L) 10/14/2019   HCT 37.2 10/14/2019   MCV 83.0 10/14/2019   PLT 235 10/14/2019   No results found for: IRON, TIBC, FERRITIN  Attestation Statements:   Reviewed by clinician on day of visit: allergies, medications, problem list, medical history, surgical history, family history, social history, and previous encounter notes.  I, Brendell Tyus, RMA, am acting as transcriptionist for Everardo Pacific, FNP. .  I have reviewed the above documentation for accuracy and completeness, and I agree with the above. Everardo Pacific, FNP

## 2022-02-19 ENCOUNTER — Ambulatory Visit (INDEPENDENT_AMBULATORY_CARE_PROVIDER_SITE_OTHER): Payer: Medicaid Other | Admitting: Family Medicine

## 2022-02-19 ENCOUNTER — Encounter (INDEPENDENT_AMBULATORY_CARE_PROVIDER_SITE_OTHER): Payer: Self-pay | Admitting: Family Medicine

## 2022-02-19 VITALS — BP 115/71 | HR 66 | Temp 98.1°F | Ht 64.0 in | Wt 223.0 lb

## 2022-02-19 DIAGNOSIS — Z6838 Body mass index (BMI) 38.0-38.9, adult: Secondary | ICD-10-CM

## 2022-02-19 DIAGNOSIS — E669 Obesity, unspecified: Secondary | ICD-10-CM

## 2022-02-19 DIAGNOSIS — E559 Vitamin D deficiency, unspecified: Secondary | ICD-10-CM

## 2022-02-19 MED ORDER — VITAMIN D (ERGOCALCIFEROL) 1.25 MG (50000 UNIT) PO CAPS: 50000.0000 [IU] | ORAL_CAPSULE | ORAL | 0 refills | Status: DC

## 2022-02-19 NOTE — Progress Notes (Unsigned)
Chief Complaint:   OBESITY Linda Odom is here to discuss her progress with her obesity treatment plan along with follow-up of her obesity related diagnoses. Linda Odom is on the Category 2 Plan and states she is following her eating plan approximately 75-80% of the time. Linda Odom states she is doing upper body, and walking for 60-90 minutes 2-3 times per week.  Today's visit was #: 4 Starting weight: 223 lbs Starting date: 12/27/2021 Today's weight: 223 lbs Today's date: 02/19/2022 Total lbs lost to date: 0 Total lbs lost since last in-office visit: 0  Interim History: Linda Odom has gained some weight, but she notes her clothes are fitting looser.  She is retaining some water weight today.  She is working on increasing her water intake and getting more exercise in with an active job.  Subjective:   1. Vitamin D deficiency Linda Odom has been out of vitamin D, and her last level was not yet at goal.  Assessment/Plan:   1. Vitamin D deficiency We will refill prescription Vitamin D for 1 month.  Linda Odom Will follow-up for routine testing of Vitamin D, at least 2-3 times per year to avoid over-replacement.  - Vitamin D, Ergocalciferol, (DRISDOL) 1.25 MG (50000 UNIT) CAPS capsule; Take 1 capsule (50,000 Units total) by mouth once a week.  Dispense: 5 capsule; Refill: 0  2. Obesity, Current BMI 38.3 Linda Odom is currently in the action stage of change. As such, her goal is to continue with weight loss efforts. She has agreed to keeping a food journal and adhering to recommended goals of 1100-1300 calories and 80+ grams of protein daily.   Exercise goals: As is.   Behavioral modification strategies: increasing lean protein intake, increasing vegetables, decreasing eating out, meal planning and cooking strategies, and keeping a strict food journal.  Linda Odom has agreed to follow-up with our clinic in 2 weeks. She was informed of the importance of frequent follow-up visits to maximize her success  with intensive lifestyle modifications for her multiple health conditions.   Objective:   Blood pressure 115/71, pulse 66, temperature 98.1 F (36.7 C), height '5\' 4"'$  (1.626 m), weight 223 lb (101.2 kg), SpO2 100 %. Body mass index is 38.28 kg/m.  General: Cooperative, alert, well developed, in no acute distress. HEENT: Conjunctivae and lids unremarkable. Cardiovascular: Regular rhythm.  Lungs: Normal work of breathing. Neurologic: No focal deficits.   Lab Results  Component Value Date   CREATININE 0.84 12/27/2021   BUN 10 12/27/2021   NA 142 12/27/2021   K 4.7 12/27/2021   CL 108 (H) 12/27/2021   CO2 22 12/27/2021   Lab Results  Component Value Date   ALT 19 12/27/2021   AST 15 12/27/2021   ALKPHOS 67 12/27/2021   BILITOT <0.2 12/27/2021   Lab Results  Component Value Date   HGBA1C 5.1 12/27/2021   Lab Results  Component Value Date   INSULIN 13.6 12/27/2021   Lab Results  Component Value Date   TSH 0.943 12/27/2021   Lab Results  Component Value Date   CHOL 212 (H) 12/27/2021   HDL 56 12/27/2021   LDLCALC 146 (H) 12/27/2021   TRIG 55 12/27/2021   Lab Results  Component Value Date   VD25OH 49.1 12/27/2021   Lab Results  Component Value Date   WBC 5.9 10/14/2019   HGB 11.9 (L) 10/14/2019   HCT 37.2 10/14/2019   MCV 83.0 10/14/2019   PLT 235 10/14/2019   No results found for: "IRON", "TIBC", "FERRITIN"  Attestation  Statements:   Reviewed by clinician on day of visit: allergies, medications, problem list, medical history, surgical history, family history, social history, and previous encounter notes.  Time spent on visit including pre-visit chart review and post-visit care and charting was 30 minutes.   I, Trixie Dredge, am acting as transcriptionist for Dennard Nip, MD.  I have reviewed the above documentation for accuracy and completeness, and I agree with the above. -  Dennard Nip, MD

## 2022-03-06 ENCOUNTER — Ambulatory Visit (INDEPENDENT_AMBULATORY_CARE_PROVIDER_SITE_OTHER): Payer: Medicaid Other | Admitting: Family Medicine

## 2022-03-20 ENCOUNTER — Other Ambulatory Visit (INDEPENDENT_AMBULATORY_CARE_PROVIDER_SITE_OTHER): Payer: Self-pay | Admitting: Nurse Practitioner

## 2022-03-20 DIAGNOSIS — F5089 Other specified eating disorder: Secondary | ICD-10-CM

## 2022-04-17 ENCOUNTER — Encounter (INDEPENDENT_AMBULATORY_CARE_PROVIDER_SITE_OTHER): Payer: Self-pay

## 2022-04-19 ENCOUNTER — Emergency Department (HOSPITAL_BASED_OUTPATIENT_CLINIC_OR_DEPARTMENT_OTHER)
Admission: EM | Admit: 2022-04-19 | Discharge: 2022-04-19 | Disposition: A | Discharge status: Home / Self Care | Payer: Medicaid Other | Attending: Emergency Medicine | Admitting: Emergency Medicine

## 2022-04-19 ENCOUNTER — Other Ambulatory Visit: Payer: Self-pay

## 2022-04-19 ENCOUNTER — Encounter (HOSPITAL_BASED_OUTPATIENT_CLINIC_OR_DEPARTMENT_OTHER): Payer: Self-pay

## 2022-04-19 DIAGNOSIS — M545 Low back pain, unspecified: Secondary | ICD-10-CM | POA: Diagnosis present

## 2022-04-19 DIAGNOSIS — F17210 Nicotine dependence, cigarettes, uncomplicated: Secondary | ICD-10-CM | POA: Diagnosis not present

## 2022-04-19 DIAGNOSIS — M461 Sacroiliitis, not elsewhere classified: Secondary | ICD-10-CM | POA: Diagnosis not present

## 2022-04-19 MED ORDER — HYDROCODONE-ACETAMINOPHEN 5-325 MG PO TABS
1.0000 | ORAL_TABLET | Freq: Once | ORAL | Status: AC
  Administered 2022-04-19: 1 via ORAL
  Filled 2022-04-19: qty 1

## 2022-04-19 MED ORDER — MELOXICAM 15 MG PO TABS: ORAL_TABLET | ORAL | 0 refills | Status: DC

## 2022-04-19 MED ORDER — HYDROCODONE-ACETAMINOPHEN 5-325 MG PO TABS: 1.0000 | ORAL_TABLET | Freq: Four times a day (QID) | ORAL | 0 refills | Status: DC | PRN

## 2022-04-19 NOTE — ED Provider Notes (Signed)
[facility] Provider Note: Georgena Spurling, MD, FACEP  CSN: [id number] MRN: [id number] ARRIVAL: 04/19/22 at [facility]: [id number]   CHIEF COMPLAINT  Back Pain   HISTORY OF PRESENT ILLNESS  04/19/22 5:57 AM Linda Odom is a 41 y.o. female with right lower back pain that has been persistent for 1 week.  She has taken tylenol, ibuprofen, and other prescribed pain medications without relief. Denies any traumas or falls, states pain came out of nowhere.  Pain is better when she walks and sits up versus lying down.  She rates her pain as a 10 out of 10, aching and pressure-like in nature.  There is no numbness or weakness.  The pain does not radiate to her groin.  Past Medical History:  Diagnosis Date   Anxiety    Arthritis    Bipolar 1 disorder ([place])    Breast nodule    Depression    Gastric ulcer    No pertinent past medical history    Osteoarthritis    Vitamin D deficiency     Past Surgical History:  Procedure Laterality Date   CHOLECYSTECTOMY     LAPAROSCOPIC APPENDECTOMY N/A 11/13/2013   Procedure: LAP ASSISTED PARTIAL CECECTOMY;  Surgeon: Odis Hollingshead, MD;  Location: [facility];  Service: General;  Laterality: N/A;   leg sugery  1994   TUBAL LIGATION  2006    Family History  Problem Relation Age of Onset   Hypertension Mother    Diabetes Mother    High Cholesterol Mother    Sudden death Mother    Stroke Mother    Depression Mother    Anxiety disorder Mother    Hypertension Maternal Grandmother    Other Neg Hx     Social History   Tobacco Use   Smoking status: Some Days    Packs/day: 17.00    Types: Cigarettes   Smokeless tobacco: Never  Substance Use Topics   Alcohol use: Yes    Comment: occ   Drug use: Yes    Types: Marijuana    Prior to Admission medications   Medication Sig Start Date End Date Taking? Authorizing Provider  HYDROcodone-acetaminophen (NORCO) 5-325 MG tablet Take 1 tablet by mouth every 6 (six) hours as needed for severe  pain. 04/19/22  Yes Krisandra Bueno, MD  meloxicam (MOBIC) 15 MG tablet Take 1 tablet daily for back pain. 04/19/22  Yes Marquest Gunkel, MD  albuterol (VENTOLIN HFA) 108 (90 Base) MCG/ACT inhaler Inhale 1-2 puffs into the lungs every 6 (six) hours as needed for wheezing or shortness of breath. 06/18/19   Wieters, Hallie C, PA-C  benzonatate (TESSALON) 100 MG capsule Take 1 capsule (100 mg total) by mouth 3 (three) times daily as needed for cough. 10/14/19   Corena Herter, PA-C  buPROPion (WELLBUTRIN SR) 200 MG 12 hr tablet Take 1 tablet (200 mg total) by mouth daily. 01/30/22   Everardo Pacific, FNP  Cetirizine HCl 10 MG CAPS Take 1 capsule (10 mg total) by mouth daily. 06/18/19   Wieters, Hallie C, PA-C  fluticasone (FLONASE) 50 MCG/ACT nasal spray Place into both nostrils. 01/11/22   [provider]  furosemide (LASIX) 20 MG tablet Take 20 mg by mouth daily as needed for fluid.  09/21/19   [provider]  levocetirizine (XYZAL) 5 MG tablet Take 5 mg by mouth at bedtime. 10/06/19   [provider]  omeprazole (PRILOSEC) 20 MG capsule Take 20 mg by mouth 2 (two) times daily. 09/21/19  [provider]  ondansetron (ZOFRAN ODT) 4 MG disintegrating tablet Take 1 tablet (4 mg total) by mouth every 8 (eight) hours as needed for nausea or vomiting. 10/14/19   Corena Herter, PA-C  Vitamin D, Ergocalciferol, (DRISDOL) 1.25 MG (50000 UNIT) CAPS capsule Take 1 capsule (50,000 Units total) by mouth once a week. 02/19/22   Starlyn Skeans, MD    Allergies Patient has no known allergies.   REVIEW OF SYSTEMS  Negative except as noted here or in the History of Present Illness.   PHYSICAL EXAMINATION  Initial Vital Signs Blood pressure (!) 106/58, pulse 67, temperature 98.7 F (37.1 C), temperature source Oral, resp. rate 18, height '5\' 2"'$  (1.575 m), weight 97.5 kg, SpO2 99 %.  Examination General: Well-developed, well-nourished female in no acute distress; appearance  consistent with age of record HENT: normocephalic; atraumatic Eyes: Normal appearance Neck: supple Heart: regular rate and rhythm Lungs: clear to auscultation bilaterally Abdomen: soft; nondistended; nontender; bowel sounds present Back: Right SI tenderness Extremities: No deformity; full range of motion Neurologic: Awake, alert and oriented; motor function intact in all extremities and symmetric; no facial droop Skin: Warm and dry Psychiatric: Normal mood and affect   RESULTS  Summary of this visit's results, reviewed and interpreted by myself:   EKG Interpretation  Date/Time:    Ventricular Rate:    PR Interval:    QRS Duration:   QT Interval:    QTC Calculation:   R Axis:     Text Interpretation:         Laboratory Studies: No results found for this or any previous visit (from the past 24 hour(s)). Imaging Studies: No results found.  ED COURSE and MDM  Nursing notes, initial and subsequent vitals signs, including pulse oximetry, reviewed and interpreted by myself.  Vitals:   04/19/22 0555 04/19/22 0556  BP: (!) 106/58   Pulse: 67   Resp: 18   Temp: 98.7 F (37.1 C)   TempSrc: Oral   SpO2: 99%   Weight:  97.5 kg  Height:  '5\' 2"'$  (1.575 m)   Medications  HYDROcodone-acetaminophen (NORCO/VICODIN) 5-325 MG per tablet 1 tablet (has no administration in time range)    The location of the pain is consistent with sacroiliitis.  Sacroiliitis often radiates to the right groin but not in every case.  This patient is not having radiation to the groin.  We will treat her with Mobic and a brief course of narcotic pain medication and refer her to orthopedics for further evaluation and treatment.  PROCEDURES  Procedures   ED DIAGNOSES     ICD-10-CM   1. Sacroiliitis ([place])  M46.1          Ellaree Gear, Jenny Reichmann, MD 04/19/22 [phone]

## 2022-04-19 NOTE — ED Triage Notes (Signed)
Pt. AAxO4, ambulatory, states her back pain feels better when she walks, states she would prefer to sit in bed versus lay down. States she has had back pain for about 1 week without relief, denies trauma, falls, or a cause. Denies any other s/s.

## 2022-04-25 ENCOUNTER — Ambulatory Visit (INDEPENDENT_AMBULATORY_CARE_PROVIDER_SITE_OTHER): Payer: Medicaid Other | Admitting: Physician Assistant

## 2022-04-25 ENCOUNTER — Encounter: Payer: Self-pay | Admitting: Physician Assistant

## 2022-04-25 ENCOUNTER — Ambulatory Visit (INDEPENDENT_AMBULATORY_CARE_PROVIDER_SITE_OTHER): Payer: Medicaid Other

## 2022-04-25 DIAGNOSIS — M5459 Other low back pain: Secondary | ICD-10-CM

## 2022-04-25 DIAGNOSIS — M545 Low back pain, unspecified: Secondary | ICD-10-CM

## 2022-04-25 MED ORDER — METHOCARBAMOL 500 MG PO TABS: 500.0000 mg | ORAL_TABLET | Freq: Two times a day (BID) | ORAL | 2 refills | Status: DC | PRN

## 2022-04-25 MED ORDER — HYDROCODONE-ACETAMINOPHEN 5-325 MG PO TABS: 1.0000 | ORAL_TABLET | Freq: Two times a day (BID) | ORAL | 0 refills | Status: AC | PRN

## 2022-04-25 MED ORDER — PREDNISONE 10 MG (21) PO TBPK: ORAL_TABLET | ORAL | 0 refills | Status: DC

## 2022-04-25 NOTE — Progress Notes (Signed)
Office Visit Note   Patient: Linda Odom           Date of Birth: [birth year]           MRN: [id number] Visit Date: 04/25/2022              Requested by: Nolene Ebbs, MD [address] [place],  [place] [id number] PCP: Nolene Ebbs, MD   Assessment & Plan: Visit Diagnoses:  1. Acute low back pain, unspecified back pain laterality, unspecified whether sciatica present     Plan: Right-sided low back pain.  At this point, I would like to start the patient on a steroid pack and muscle relaxer.  Also like to start a course of formal physical therapy.  If her symptoms do not improve over the next 6 to 8 weeks she will let us know.  She will call with concerns or questions in the meantime.  Follow-Up Instructions: Return if symptoms worsen or fail to improve.   Orders:  Orders Placed This Encounter  Procedures   XR Lumbar Spine 2-3 Views   Meds ordered this encounter  Medications   predniSONE (STERAPRED UNI-PAK 21 TAB) 10 MG (21) TBPK tablet    Sig: Take as directed with food    Dispense:  21 tablet    Refill:  0   methocarbamol (ROBAXIN) 500 MG tablet    Sig: Take 1 tablet (500 mg total) by mouth 2 (two) times daily as needed for muscle spasms.    Dispense:  20 tablet    Refill:  2   HYDROcodone-acetaminophen (NORCO) 5-325 MG tablet    Sig: Take 1 tablet by mouth 2 (two) times daily as needed.    Dispense:  10 tablet    Refill:  0      Procedures: No procedures performed   Clinical Data: No additional findings.   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  Her symptoms been ongoing for the past 2 weeks.  She denies any injury or change in activity.  The pain is described as a constant throbbing pressure worse with lumbar flexion and right-sided rotation.  She was seen at [place]  where she was prescribed Norco and Mobic.  She denies much relief from these medications.  She has been doing yoga without relief.  She denies a bowel bladder change or saddle paresthesias.  Review of Systems as detailed in HPI.  All others reviewed and are negative.   Objective: Vital Signs: There were no vitals taken for this visit.  Physical Exam well-developed well-nourished female no acute distress.  Alert and oriented x3.  Ortho Exam lumbar spine exam she has no spinous or paraspinous tenderness.  Is tenderness to the lateral hip.  She does have markedly positive straight leg raise on the right.  No focal weakness.  She is neurovascular tact distally.  Specialty Comments:  No specialty comments available.  Imaging: XR Lumbar Spine 2-3 Views  Result Date: 04/25/2022 X-rays demonstrate mild degenerative changes L5-S1    PMFS History: Patient Active Problem List   Diagnosis Date Noted   Class 2 obesity due to excess calories with body mass index (BMI) of 38.0 to 38.9 in adult 01/09/2022   Elevated cholesterol 12/31/2021   Acute appendicitis 11/13/2013   Acute appendicitis, unspecified acute appendicitis  type 11/13/2013   Past Medical History:  Diagnosis Date   Anxiety    Arthritis    Bipolar 1 disorder ([place])    Breast nodule    Depression    Gastric ulcer    No pertinent past medical history    Osteoarthritis    Vitamin D deficiency     Family History  Problem Relation Age of Onset   Hypertension Mother    Diabetes Mother    High Cholesterol Mother    Sudden death Mother    Stroke Mother    Depression Mother    Anxiety disorder Mother    Hypertension Maternal Grandmother    Other Neg Hx     Past Surgical History:  Procedure Laterality Date   CHOLECYSTECTOMY     LAPAROSCOPIC APPENDECTOMY N/A 11/13/2013   Procedure: LAP ASSISTED PARTIAL CECECTOMY;  Surgeon: Odis Hollingshead, MD;  Location: [facility];  Service: General;  Laterality: N/A;   leg sugery  1994    TUBAL LIGATION  2006   Social History   Occupational History   Not on file  Tobacco Use   Smoking status: Some Days    Packs/day: 17.00    Types: Cigarettes   Smokeless tobacco: Never  Substance and Sexual Activity   Alcohol use: Yes    Comment: occ   Drug use: Yes    Types: Marijuana   Sexual activity: Yes    Birth control/protection: Surgical

## 2022-05-09 ENCOUNTER — Ambulatory Visit: Payer: Medicaid Other | Attending: Physician Assistant | Admitting: Physical Therapy

## 2022-05-20 ENCOUNTER — Ambulatory Visit: Payer: Medicaid Other | Admitting: Physical Therapy

## 2022-05-25 ENCOUNTER — Ambulatory Visit: Payer: Medicaid Other | Attending: Physician Assistant

## 2022-09-24 ENCOUNTER — Other Ambulatory Visit: Payer: Self-pay | Admitting: Nurse Practitioner

## 2022-09-24 DIAGNOSIS — E0789 Other specified disorders of thyroid: Secondary | ICD-10-CM

## 2022-10-02 ENCOUNTER — Inpatient Hospital Stay: Admission: RE | Admit: 2022-10-02 | Payer: Medicaid Other | Source: Ambulatory Visit

## 2022-10-15 ENCOUNTER — Other Ambulatory Visit: Payer: Medicaid Other

## 2022-10-16 ENCOUNTER — Other Ambulatory Visit: Payer: Medicaid Other

## 2022-11-08 ENCOUNTER — Other Ambulatory Visit: Payer: Medicaid Other

## 2022-11-27 ENCOUNTER — Other Ambulatory Visit: Payer: Medicaid Other

## 2022-12-25 ENCOUNTER — Other Ambulatory Visit: Payer: Medicaid Other

## 2023-03-25 NOTE — Progress Notes (Unsigned)
New Patient Office Visit  Subjective:   Linda Odom [birth year] 03/26/2023  No chief complaint on file.   HPI: [name] presents today to establish care at [facility] at [facility] Introduced to Publishing rights manager role and practice setting.  All questions answered.   Last PCP: *** Last annual physical: *** Concerns: See below    The following portions of the patient's history were reviewed and updated as appropriate: past medical history, past surgical history, family history, social history, allergies, medications, and problem list.   Patient Active Problem List   Diagnosis Date Noted   Class 2 obesity due to excess calories with body mass index (BMI) of 38.0 to 38.9 in adult 01/09/2022   Elevated cholesterol 12/31/2021   Acute appendicitis 11/13/2013   Acute appendicitis, unspecified acute appendicitis type 11/13/2013   Past Medical History:  Diagnosis Date   Anxiety    Arthritis    Bipolar 1 disorder ([facility])    Breast nodule    Depression    Gastric ulcer    No pertinent past medical history    Osteoarthritis    Vitamin D deficiency    Past Surgical History:  Procedure Laterality Date   CHOLECYSTECTOMY     LAPAROSCOPIC APPENDECTOMY N/A 11/13/2013   Procedure: LAP ASSISTED PARTIAL CECECTOMY;  Surgeon: Adolph Pollack, MD;  Location: [facility];  Service: General;  Laterality: N/A;   leg sugery  1994   TUBAL LIGATION  2006   Family History  Problem Relation Age of Onset   Hypertension Mother    Diabetes Mother    High Cholesterol Mother    Sudden death Mother    Stroke Mother    Depression Mother    Anxiety disorder Mother    Hypertension Maternal Grandmother    Other Neg Hx    Social History   Socioeconomic History   Marital status: Single    Spouse name: Not on file   Number of children: Not on file   Years of education: Not on file   Highest education level: Not on file  Occupational History   Not on  file  Tobacco Use   Smoking status: Some Days    Current packs/day: 17.00    Types: Cigarettes   Smokeless tobacco: Never  Substance and Sexual Activity   Alcohol use: Yes    Comment: occ   Drug use: Yes    Types: Marijuana   Sexual activity: Yes    Birth control/protection: Surgical  Other Topics Concern   Not on file  Social History Narrative   Not on file   Social Determinants of Health   Financial Resource Strain: Not on file  Food Insecurity: Not on file  Transportation Needs: Not on file  Physical Activity: Not on file  Stress: Not on file  Social Connections: Not on file  Intimate Partner Violence: Not on file   Outpatient Medications Prior to Visit  Medication Sig Dispense Refill   albuterol (VENTOLIN HFA) 108 (90 Base) MCG/ACT inhaler Inhale 1-2 puffs into the lungs every 6 (six) hours as needed for wheezing or shortness of breath. 8 g 0   benzonatate (TESSALON) 100 MG capsule Take 1 capsule (100 mg total) by mouth 3 (three) times daily as needed for cough. 21 capsule 0   buPROPion (WELLBUTRIN SR) 200 MG 12 hr tablet Take 1 tablet (200 mg total) by mouth daily. 30 tablet 0   Cetirizine HCl 10 MG CAPS Take 1 capsule (  10 mg total) by mouth daily. 15 capsule 0   fluticasone (FLONASE) 50 MCG/ACT nasal spray Place into both nostrils.     furosemide (LASIX) 20 MG tablet Take 20 mg by mouth daily as needed for fluid.      HYDROcodone-acetaminophen (NORCO) 5-325 MG tablet Take 1 tablet by mouth 2 (two) times daily as needed. 10 tablet 0   levocetirizine (XYZAL) 5 MG tablet Take 5 mg by mouth at bedtime.     meloxicam (MOBIC) 15 MG tablet Take 1 tablet daily for back pain. 15 tablet 0   methocarbamol (ROBAXIN) 500 MG tablet Take 1 tablet (500 mg total) by mouth 2 (two) times daily as needed for muscle spasms. 20 tablet 2   omeprazole (PRILOSEC) 20 MG capsule Take 20 mg by mouth 2 (two) times daily.     ondansetron (ZOFRAN ODT) 4 MG disintegrating tablet Take 1 tablet (4 mg  total) by mouth every 8 (eight) hours as needed for nausea or vomiting. 20 tablet 0   predniSONE (STERAPRED UNI-PAK 21 TAB) 10 MG (21) TBPK tablet Take as directed with food 21 tablet 0   Vitamin D, Ergocalciferol, (DRISDOL) 1.25 MG (50000 UNIT) CAPS capsule Take 1 capsule (50,000 Units total) by mouth once a week. 5 capsule 0   No facility-administered medications prior to visit.   No Known Allergies  ROS: A complete ROS was performed with pertinent positives/negatives noted in the HPI. The remainder of the ROS are negative.   Objective:   There were no vitals filed for this visit.  GENERAL: Well-appearing, in NAD. Well nourished.  SKIN: Pink, warm and dry. No rash, lesion, ulceration, or ecchymoses.  Head: Normocephalic. NECK: Trachea midline. Full ROM w/o pain or tenderness. No lymphadenopathy.  EARS: Tympanic membranes are intact, translucent without bulging and without drainage. Appropriate landmarks visualized.  EYES: Conjunctiva clear without exudates. EOMI, PERRL, no drainage present.  THROAT: Uvula midline. Oropharynx clear. Tonsils non-inflamed without exudate. Mucous membranes pink and moist.  RESPIRATORY: Chest wall symmetrical. Respirations even and non-labored. Breath sounds clear to auscultation bilaterally.  CARDIAC: S1, S2 present, regular rate and rhythm without murmur or gallops. Peripheral pulses 2+ bilaterally.  MSK: Muscle tone and strength appropriate for age. Joints w/o tenderness, redness, or swelling.  EXTREMITIES: Without clubbing, cyanosis, or edema.  NEUROLOGIC: No motor or sensory deficits. Steady, even gait. C2-C12 intact.  PSYCH/MENTAL STATUS: Alert, oriented x 3. Cooperative, appropriate mood and affect.    Health Maintenance Due  Topic Date Due   HIV Screening  Never done   Hepatitis C Screening  Never done   DTaP/Tdap/Td (1 - Tdap) Never done   PAP SMEAR-Modifier  06/14/2017   COVID-19 Vaccine (1 - 2023-24 season) Never done    No results found  for any visits on 03/26/23.     Assessment & Plan:  There are no diagnoses linked to this encounter.    Patient to reach out to office if new, worrisome, or unresolved symptoms arise or if no improvement in patient's condition. Patient verbalized understanding and is agreeable to treatment plan. All questions answered to patient's satisfaction.    No follow-ups on file.   Of note, portions of this note may have been created with voice recognition software Physicist, medical). While this note has been edited for accuracy, occasional wrong-word or 'sound-a-like' substitutions may have occurred due to the inherent limitations of voice recognition software.  Yolanda Manges, FNP

## 2023-03-26 ENCOUNTER — Encounter (HOSPITAL_BASED_OUTPATIENT_CLINIC_OR_DEPARTMENT_OTHER): Payer: Self-pay | Admitting: Family Medicine

## 2023-03-26 ENCOUNTER — Ambulatory Visit (HOSPITAL_BASED_OUTPATIENT_CLINIC_OR_DEPARTMENT_OTHER): Payer: Medicaid Other | Admitting: Family Medicine

## 2023-03-26 VITALS — BP 111/78 | HR 94 | Ht 62.0 in | Wt 219.5 lb

## 2023-03-26 DIAGNOSIS — E559 Vitamin D deficiency, unspecified: Secondary | ICD-10-CM | POA: Diagnosis not present

## 2023-03-26 DIAGNOSIS — M545 Low back pain, unspecified: Secondary | ICD-10-CM | POA: Diagnosis not present

## 2023-03-26 DIAGNOSIS — G8929 Other chronic pain: Secondary | ICD-10-CM | POA: Insufficient documentation

## 2023-03-26 DIAGNOSIS — Z7689 Persons encountering health services in other specified circumstances: Secondary | ICD-10-CM

## 2023-03-26 DIAGNOSIS — Z1231 Encounter for screening mammogram for malignant neoplasm of breast: Secondary | ICD-10-CM

## 2023-03-26 MED ORDER — MELOXICAM 15 MG PO TABS: 15.0000 mg | ORAL_TABLET | Freq: Every day | ORAL | 0 refills | Status: DC | PRN

## 2023-03-26 NOTE — Therapy (Signed)
OUTPATIENT PHYSICAL THERAPY THORACOLUMBAR EVALUATION   Patient Name: Linda Odom MRN: [id number] DOB:[birth year], 42 y.o., female Today's Date: 03/27/2023  END OF SESSION:  PT End of Session - 03/27/23 1659     Visit Number 1    Date for PT Re-Evaluation 05/22/23    Authorization Type [employer]    Authorization - Visit Number 1    Authorization - Number of Visits 27    PT Start Time 0434    PT Stop Time 0458    PT Time Calculation (min) 24 min    Activity Tolerance Patient tolerated treatment well    Behavior During Therapy WFL for tasks assessed/performed             Past Medical History:  Diagnosis Date   Anxiety    Arthritis    Bipolar 1 disorder ([facility])    Breast nodule    Depression    Gastric ulcer    No pertinent past medical history    Osteoarthritis    Vitamin D deficiency    Past Surgical History:  Procedure Laterality Date   CHOLECYSTECTOMY     LAPAROSCOPIC APPENDECTOMY N/A 11/13/2013   Procedure: LAP ASSISTED PARTIAL CECECTOMY;  Surgeon: Adolph Pollack, MD;  Location: [facility];  Service: General;  Laterality: N/A;   leg sugery  1994   TUBAL LIGATION  2006   Patient Active Problem List   Diagnosis Date Noted   Class 3 severe obesity due to excess calories with serious comorbidity and body mass index (BMI) of 40.0 to 44.9 in adult ([facility]CC) 03/26/2023   Vitamin D deficiency 03/26/2023   Chronic low back pain without sciatica 03/26/2023   Class 2 obesity due to excess calories with body mass index (BMI) of 38.0 to 38.9 in adult 01/09/2022   Elevated cholesterol 12/31/2021   Acute appendicitis 11/13/2013   Acute appendicitis, unspecified acute appendicitis type 11/13/2013    PCP: Hilbert Bible, FNP   REFERRING PROVIDER: Hilbert Bible, *   REFERRING DIAG: [phone] (ICD-10-CM) - Chronic low back pain without sciatica, unspecified back pain laterality   Rationale for Evaluation and Treatment: Rehabilitation  THERAPY DIAG:   Other low back pain  Muscle weakness (generalized)  Cramp and spasm  ONSET DATE: one month ago, but has had pain for 6 months or more  SUBJECTIVE:                                                                                                                                                                                           SUBJECTIVE STATEMENT: Patient reports stiffness in low back x one month. 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 Was working at [employer] job but couldn't tolerate the standing.  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 Yes: NPRS scale: 7-8/10 Pain location: low back sometimes in to hips Pain description: pinching Aggravating factors: pulling and lifting Relieving factors: ice, meds, stretching  PRECAUTIONS: None  RED FLAGS: None   WEIGHT BEARING RESTRICTIONS: No  FALLS:  Has patient fallen in last 6 months? No  LIVING ENVIRONMENT: Lives with: lives alone Lives in: House/apartment Stairs: No Has following equipment at home: None  OCCUPATION: nurse  PLOF: Independent  PATIENT GOALS: reduce her pain  NEXT MD VISIT: 2 weeks  OBJECTIVE:   DIAGNOSTIC FINDINGS:  XR mild degenerative changes L5-S1   COGNITION: Overall cognitive status: Within functional limits for tasks assessed .Marland Kitchen  PATIENT SURVEYS: Modified Oswestry 13 / 50 = 26.0 %      SENSATION: WFL  MUSCLE LENGTH: Marked B HS, quads and gluteal tightness L>R  POSTURE: increased lumbar lordosis  PALPATION: TTP in B gluteals along iliac crest; B lumbar Spinal mobility WNL  LUMBAR ROM: WNL   LOWER EXTREMITY ROM:   Restricted by tight muscles (see above)  Active  Right eval Left eval  Hip flexion    Hip extension    Hip abduction    Hip adduction    Hip internal rotation    Hip external rotation    Knee flexion    Knee extension    Ankle dorsiflexion    Ankle  plantarflexion    Ankle inversion    Ankle eversion     (Blank rows = not tested)  LOWER EXTREMITY MMT:    MMT Right eval Left eval  Hip flexion 5 4+  Hip extension 5 4-  Hip abduction 5 4-  Hip adduction 5 4-  Hip internal rotation    Hip external rotation    Knee flexion 4+ 4+  Knee extension 5 5  Ankle dorsiflexion 5 5  Ankle plantarflexion    Ankle inversion    Ankle eversion     (Blank rows = not tested)  LUMBAR SPECIAL TESTS:  Straight leg raise test: Negative  FUNCTIONAL TESTS:  5 times sit to stand: 18.38   TODAY'S TREATMENT:                                                                                                                              DATE:   03/27/23 EVAL ONLY  PATIENT EDUCATION:  Education details: PT eval findings and anticipated POC  Person educated: Patient Education method: Explanation Education comprehension: verbalized understanding  HOME EXERCISE PROGRAM: TBD  ASSESSMENT:  CLINICAL IMPRESSION: Patient is a 42 y.o. female who was seen today for physical therapy evaluation and treatment for chronic LBP beginning 6 months ago but worsening in the past month. 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 She has full lumbar ROM but has marked flexibility deficits in B LE. She has recently lost significant weight and works out regularly with a Psychologist, educational. Her L LE is significantly weaker than the R and she notices she has trouble balancing on the left when working out. She will benefit from skilled PT to address these deficits.    OBJECTIVE IMPAIRMENTS: decreased activity tolerance, decreased balance, decreased ROM, decreased strength, increased muscle spasms, impaired flexibility, postural dysfunction, obesity, and pain.   ACTIVITY LIMITATIONS: standing and caring for others  PARTICIPATION LIMITATIONS: occupation  PERSONAL FACTORS: Fitness and 1-2 comorbidities: knee OA  are also affecting  patient's functional outcome.   REHAB POTENTIAL: Excellent  CLINICAL DECISION MAKING: Stable/uncomplicated  EVALUATION COMPLEXITY: Low   GOALS: Goals reviewed with patient? Yes  SHORT TERM GOALS: Target date: 04/25/2023   Patient will be independent with initial HEP.  Baseline: no HEP Goal status: INITIAL   LONG TERM GOALS: Target date: 05/23/2023   Patient will be independent with advanced/ongoing HEP to improve outcomes and carryover.  Baseline: initial HEP Goal status: INITIAL  2.  Patient will report 75% improvement in low back pain to improve QOL.  Baseline: 7-8/10 pain Goal status: INITIAL  3.  Patient will report 7/50 on lumbar Modified Oswetry to demonstrate improved functional ability.   Baseline: 13 / 50 = 26.0 % Goal status: INITIAL  4.  Patient will demonstrate improved strength to >= 4+/5 to normalize balance. Baseline: see objective chart Goal status: INITIAL  PLAN:  PT FREQUENCY: 2x/week  PT DURATION: 8 weeks  PLANNED INTERVENTIONS: Therapeutic exercises, Therapeutic activity, Neuromuscular re-education, Balance training, Patient/Family education, Self Care, Joint mobilization, Aquatic Therapy, Dry Needling, Electrical stimulation, Spinal mobilization, Cryotherapy, Moist heat, Traction, Manual therapy, and Re-evaluation.  PLAN FOR NEXT SESSION: Focus on hip/leg flexibility, core and lumbar stab, L LE strength   Solon Palm, PT 03/27/2023, 5:05 PM

## 2023-03-27 ENCOUNTER — Encounter: Payer: Self-pay | Admitting: Physical Therapy

## 2023-03-27 ENCOUNTER — Ambulatory Visit: Payer: Medicaid Other | Attending: Family Medicine | Admitting: Physical Therapy

## 2023-03-27 ENCOUNTER — Other Ambulatory Visit: Payer: Self-pay

## 2023-03-27 DIAGNOSIS — M545 Low back pain, unspecified: Secondary | ICD-10-CM | POA: Insufficient documentation

## 2023-03-27 DIAGNOSIS — M6281 Muscle weakness (generalized): Secondary | ICD-10-CM | POA: Diagnosis present

## 2023-03-27 DIAGNOSIS — G8929 Other chronic pain: Secondary | ICD-10-CM | POA: Diagnosis not present

## 2023-03-27 DIAGNOSIS — M5459 Other low back pain: Secondary | ICD-10-CM | POA: Insufficient documentation

## 2023-03-27 DIAGNOSIS — R252 Cramp and spasm: Secondary | ICD-10-CM | POA: Diagnosis present

## 2023-04-03 ENCOUNTER — Encounter (HOSPITAL_BASED_OUTPATIENT_CLINIC_OR_DEPARTMENT_OTHER): Payer: Self-pay | Admitting: Family Medicine

## 2023-04-06 ENCOUNTER — Ambulatory Visit (HOSPITAL_BASED_OUTPATIENT_CLINIC_OR_DEPARTMENT_OTHER): Payer: Medicaid Other | Admitting: Radiology

## 2023-04-09 ENCOUNTER — Encounter (HOSPITAL_BASED_OUTPATIENT_CLINIC_OR_DEPARTMENT_OTHER): Payer: Self-pay | Admitting: Family Medicine

## 2023-04-09 ENCOUNTER — Other Ambulatory Visit (HOSPITAL_COMMUNITY)
Admission: RE | Admit: 2023-04-09 | Discharge: 2023-04-09 | Disposition: A | Discharge status: Home / Self Care | Payer: Medicaid Other | Source: Ambulatory Visit

## 2023-04-09 ENCOUNTER — Ambulatory Visit (INDEPENDENT_AMBULATORY_CARE_PROVIDER_SITE_OTHER): Payer: Medicaid Other | Admitting: Family Medicine

## 2023-04-09 VITALS — BP 115/75 | HR 62 | Ht 62.0 in | Wt 221.0 lb

## 2023-04-09 DIAGNOSIS — Z124 Encounter for screening for malignant neoplasm of cervix: Secondary | ICD-10-CM

## 2023-04-09 DIAGNOSIS — R1909 Other intra-abdominal and pelvic swelling, mass and lump: Secondary | ICD-10-CM

## 2023-04-09 DIAGNOSIS — E010 Iodine-deficiency related diffuse (endemic) goiter: Secondary | ICD-10-CM | POA: Diagnosis not present

## 2023-04-09 DIAGNOSIS — Z0000 Encounter for general adult medical examination without abnormal findings: Secondary | ICD-10-CM

## 2023-04-09 DIAGNOSIS — Z113 Encounter for screening for infections with a predominantly sexual mode of transmission: Secondary | ICD-10-CM | POA: Insufficient documentation

## 2023-04-09 DIAGNOSIS — G8929 Other chronic pain: Secondary | ICD-10-CM

## 2023-04-09 DIAGNOSIS — Z6841 Body Mass Index (BMI) 40.0 and over, adult: Secondary | ICD-10-CM

## 2023-04-09 DIAGNOSIS — E559 Vitamin D deficiency, unspecified: Secondary | ICD-10-CM

## 2023-04-09 DIAGNOSIS — E66813 Obesity, class 3: Secondary | ICD-10-CM

## 2023-04-09 DIAGNOSIS — M545 Low back pain, unspecified: Secondary | ICD-10-CM

## 2023-04-09 MED ORDER — IBUPROFEN 800 MG PO TABS: 800.0000 mg | ORAL_TABLET | Freq: Three times a day (TID) | ORAL | 1 refills | Status: DC | PRN

## 2023-04-09 MED ORDER — CYCLOBENZAPRINE HCL 10 MG PO TABS
10.0000 mg | ORAL_TABLET | Freq: Three times a day (TID) | ORAL | 1 refills | Status: DC | PRN
Start: 2023-04-09 — End: 2024-01-14

## 2023-04-09 NOTE — Progress Notes (Signed)
Subjective:   Linda Odom 1980/12/05  04/09/2023   CC: Chief Complaint  Patient presents with   Annual Exam    Pt is here for her physical as well as pap smear. Denies any concerns for today's visit.    HPI: Linda Odom is a 42 y.o. female who presents for a routine health maintenance exam.  Labs collected at time of visit.   CHRONIC BACK PAIN:  Pt had initial PT evaluation on 03/27/23 and is scheduled to start several weeks of PT. She states she would like a refill of her Ibuprofen. She is still having chronic moderate back pain to lower back and spasms. She does heavy lifting for her job daily and this exacerbates her pain. Denies numbness, tingling, paresthesias, incontinence of bowel or bladder.    THYROID CONCERN:  Patient states she has concern of thyroid nodules. She states she was ordered a thyroid US by previous provider but did not attend [place] appt. She would like to have this re-ordered. She has noticed fatigue, weight gain and feeling weak. She would like to have a referral to [facility] due to weight gain as well. Has tried Phentermine in the past without improvement in weight.   Health Concern:  Patient states she had "body contouring" done in the past year and technician noticed a "lump" to the right upper quadrant of her abdomen. She states she has not noticed a new mass, lump, pulsation and has no pain to area of concern.   HEALTH SCREENINGS: - Vision Screening:  Recommended  - Dental Visits: up to date - Pap smear: pap done - Breast Exam:  Declined - STD Screening: Ordered today - Mammogram (40+):  Ordered and Pt will Schedule    - Colonoscopy (45+): Not applicable  - Bone Density (65+ or under 65 with predisposing conditions): Not applicable  - Lung CA screening with low-dose CT:  Not applicable Adults age 78-80 who are current cigarette smokers or quit within the last 15 years. Must have 20 pack year history.   Depression and Anxiety  Screen done today and results listed below:     04/09/2023   10:44 AM 03/26/2023   10:42 AM 12/27/2021    7:57 AM  Depression screen PHQ 2/9  Decreased Interest 2 2 0  Down, Depressed, Hopeless 2 2 1   PHQ - 2 Score 4 4 1   Altered sleeping 2 1 1   Tired, decreased energy 2 1 1   Change in appetite 2 2 1   Feeling bad or failure about yourself  1 0 1  Trouble concentrating 1 0 1  Moving slowly or fidgety/restless 2 0 1  Suicidal thoughts 0 0 0  PHQ-9 Score 14 8 7   Difficult doing work/chores Somewhat difficult Somewhat difficult Somewhat difficult      04/09/2023   10:44 AM 03/26/2023   10:42 AM  GAD 7 : Generalized Anxiety Score  Nervous, Anxious, on Edge 1 2  Control/stop worrying 2 2  Worry too much - different things 2 2  Trouble relaxing 2 2  Restless 2 1  Easily annoyed or irritable 1 2  Afraid - awful might happen 1 2  Total GAD 7 Score 11 13  Anxiety Difficulty Somewhat difficult Somewhat difficult    IMMUNIZATIONS: - Tdap: Tetanus vaccination status reviewed: tetanus status unknown to the patient. Declined.  - HPV: Not applicable - Influenza: Postponed to flu season - Pneumovax: Not applicable - Prevnar 20: Not applicable - Zostavax (50+): Not applicable  Past medical history, surgical history, medications, allergies, family history and social history reviewed with patient today and changes made to appropriate areas of the chart.   Past Medical History:  Diagnosis Date   Anxiety    Arthritis    Bipolar 1 disorder (HCC)    Breast nodule    Depression    Gastric ulcer    No pertinent past medical history    Osteoarthritis    Vitamin D deficiency     Past Surgical History:  Procedure Laterality Date   CHOLECYSTECTOMY     LAPAROSCOPIC APPENDECTOMY N/A 11/13/2013   Procedure: LAP ASSISTED PARTIAL CECECTOMY;  Surgeon: Adolph Pollack, MD;  Location: [facility];  Service: General;  Laterality: N/A;   leg sugery  1994   TUBAL LIGATION  2006    Current  Outpatient Medications on File Prior to Visit  Medication Sig   albuterol (VENTOLIN HFA) 108 (90 Base) MCG/ACT inhaler Inhale 1-2 puffs into the lungs every 6 (six) hours as needed for wheezing or shortness of breath.   benzonatate (TESSALON) 100 MG capsule Take 1 capsule (100 mg total) by mouth 3 (three) times daily as needed for cough.   buPROPion (WELLBUTRIN SR) 200 MG 12 hr tablet Take 1 tablet (200 mg total) by mouth daily.   Cetirizine HCl 10 MG CAPS Take 1 capsule (10 mg total) by mouth daily.   fluticasone (FLONASE) 50 MCG/ACT nasal spray Place into both nostrils.   furosemide (LASIX) 20 MG tablet Take 20 mg by mouth daily as needed for fluid.    HYDROcodone-acetaminophen (NORCO) 5-325 MG tablet Take 1 tablet by mouth 2 (two) times daily as needed.   levocetirizine (XYZAL) 5 MG tablet Take 5 mg by mouth at bedtime.   omeprazole (PRILOSEC) 20 MG capsule Take 20 mg by mouth 2 (two) times daily.   ondansetron (ZOFRAN ODT) 4 MG disintegrating tablet Take 1 tablet (4 mg total) by mouth every 8 (eight) hours as needed for nausea or vomiting.   Vitamin D, Ergocalciferol, (DRISDOL) 1.25 MG (50000 UNIT) CAPS capsule Take 1 capsule (50,000 Units total) by mouth once a week.   No current facility-administered medications on file prior to visit.    No Known Allergies   Social History   Socioeconomic History   Marital status: Single    Spouse name: Not on file   Number of children: Not on file   Years of education: Not on file   Highest education level: Not on file  Occupational History   Not on file  Tobacco Use   Smoking status: Former    Current packs/day: 0.00    Types: Cigarettes    Quit date: 88    Years since quitting: 28.6   Smokeless tobacco: Never   Tobacco comments:    Currently smoking 2-3 cigs about 2-3 days a week as of 03/26/23 ep  Substance and Sexual Activity   Alcohol use: Yes    Comment: occ   Drug use: Yes    Types: Marijuana   Sexual activity: Yes     Birth control/protection: Surgical  Other Topics Concern   Not on file  Social History Narrative   Not on file   Social Determinants of Health   Financial Resource Strain: Not on file  Food Insecurity: Not on file  Transportation Needs: Not on file  Physical Activity: Not on file  Stress: Not on file  Social Connections: Not on file  Intimate Partner Violence: Not on file   Social  History   Tobacco Use  Smoking Status Former   Current packs/day: 0.00   Types: Cigarettes   Quit date: 1996   Years since quitting: 28.6  Smokeless Tobacco Never  Tobacco Comments   Currently smoking 2-3 cigs about 2-3 days a week as of 03/26/23 ep   Social History   Substance and Sexual Activity  Alcohol Use Yes   Comment: occ    Family History  Problem Relation Age of Onset   Hypertension Mother    Diabetes Mother    High Cholesterol Mother    Sudden death Mother    Stroke Mother    Depression Mother    Anxiety disorder Mother    Hypertension Father    Asthma Daughter    Asthma Son    Hypertension Maternal Grandmother    Other Neg Hx      ROS: Denies fever, fatigue, unexplained weight loss/gain, chest pain, SHOB, and palpitations. Denies neurological deficits, gastrointestinal or genitourinary complaints, and skin changes.   Objective:   Today's Vitals   04/09/23 1040  BP: 115/75  Pulse: 62  SpO2: 100%  Weight: 221 lb (100.2 kg)  Height: 5\' 2"  (1.575 m)    GENERAL APPEARANCE: Well-appearing, in NAD. Well nourished.  SKIN: Pink, warm and dry. Turgor normal. No rash, lesion, ulceration, or ecchymoses. Hair evenly distributed.  HEENT: HEAD: Normocephalic.  EYES: PERRLA. EOMI. Lids intact w/o defect. Sclera white, Conjunctiva pink w/o exudate.  EARS: External ear w/o redness, swelling, masses or lesions. EAC clear. TM's intact, translucent w/o bulging, appropriate landmarks visualized. Appropriate acuity to conversational tones.  NOSE: Septum midline w/o deformity. Nares  patent, mucosa pink and non-inflamed w/o drainage. No sinus tenderness.  THROAT: Uvula midline. Oropharynx clear. Tonsils non-inflamed w/o exudate. Oral mucosa pink and moist.  NECK: Supple, Trachea midline. Full ROM w/o pain or tenderness. No lymphadenopathy. Thyroid non-tender w/ significant bilateral enlargement. No palpable masses.  BREASTS: Exam declined by patient.  RESPIRATORY: Chest wall symmetrical w/o masses. Respirations even and non-labored. Breath sounds clear to auscultation bilaterally. No wheezes, rales, rhonchi, or crackles. CARDIAC: S1, S2 present, regular rate and rhythm. No gallops, murmurs, rubs, or clicks. PMI w/o lifts, heaves, or thrills. No carotid bruits. Capillary refill <2 seconds. Peripheral pulses 2+ bilaterally. GI: Abdomen soft w/o distention. Normoactive bowel sounds. No palpable masses or tenderness. No palpable masses, lymphadenpathy, or tenderness to RUQ. No guarding or rebound tenderness. Liver and spleen w/o tenderness or enlargement. No CVA tenderness.  GU: External genitalia without erythema, lesions, or masses. No lymphadenopathy. Vaginal mucosa pink and moist without exudate, lesions, or ulcerations. Cervix pink without discharge. Cervical os closed. Uterus and adnexae palpable, not enlarged, and w/o tenderness. No palpable masses.  MSK: Muscle tone and strength appropriate for age, w/o atrophy or abnormal movement.  EXTREMITIES: Active ROM intact, w/o tenderness, crepitus, or contracture. No obvious joint deformities or effusions. No clubbing, edema, or cyanosis.  NEUROLOGIC: CN's II-XII intact. Motor strength symmetrical with no obvious weakness. No sensory deficits. DTR's 2+ symmetric bilaterally. Steady, even gait.  PSYCH/MENTAL STATUS: Alert, oriented x 3. Cooperative, appropriate mood and affect.   Chaperoned by Hendricks Milo, CMA    Assessment & Plan:  1. Annual physical exam Discussed healthy lifestyle, exercise, and will obtain fasting labs for  physical today.   - Hepatitis C antibody - HIV Antibody (routine testing w rflx) - TSH+T4F+T3Free - VITAMIN D 25 Hydroxy (Vit-D Deficiency, Fractures) - TSH - Hemoglobin A1c - CBC with Differential/Platelet - Comprehensive metabolic panel -  Lipid panel  2. Encounter for Papanicolaou smear for cervical cancer screening 3. Screening examination for STD (sexually transmitted disease) Pap completed today, will send for pap smear and STD testing at patient's request. HIV and Hep C with bloodwork per patient request.  She is asymptomatic.   - Cytology - PAP - Hepatitis C antibody - HIV Antibody (routine testing w rflx)  4. Vitamin D deficiency Will repeat Vitamin D level and provide replacement if needed.  - VITAMIN D 25 Hydroxy (Vit-D Deficiency, Fractures)  5. Class 3 severe obesity due to excess calories with serious comorbidity and body mass index (BMI) of 40.0 to 44.9 in adult (HCC) Will obtain TSH, A1C, Lipids with labs. Referral placed to [facility] per patient request.   - TSH - Hemoglobin A1c - Lipid panel - Amb Ref to [facility]  6. Thyromegaly Will check TSH and US Thyroid ordered. Pt will be called to scheduled.  - US THYROID; Future  7. Abdominal mass of other site No signs of abdominal mass, lump, lymphadenopathy, or subcutaneous changes. Pt has not been able to see or palpate any mass or change.  Pt to continue to monitor and return to PCP if site returns.   8. Chronic low back pain without sciatica, unspecified back pain laterality Ibuprofen 800mg  sent in for patient to use PRN with food. Will trial Flexeril for muscle spasm. Pt to use Flexeril only when at home and not driving or working. Discussed safe use of medication with patient and she verbalized understanding. Pt to continue with PT as directed    Orders Placed This Encounter  Procedures   US THYROID    Standing Status:   Future    Standing Expiration Date:   04/08/2024     Order Specific Question:   Reason for Exam (SYMPTOM  OR DIAGNOSIS REQUIRED)    Answer:   Bilateral Thyromegaly, Fatigue, Weight Gain    Order Specific Question:   Preferred imaging location?    Answer:   [facility]    Order Specific Question:   Call Results- Best Contact Number?    Answer:   [phone]   Hepatitis C antibody   HIV Antibody (routine testing w rflx)   TSH+T4F+T3Free   Amb Ref to [facility]    Referral Priority:   Routine    Referral Type:   Consultation    Number of Visits Requested:   1    PATIENT COUNSELING:  - Encouraged a healthy well-balanced diet. Patient may adjust caloric intake to maintain or achieve ideal body weight. May reduce intake of dietary saturated fat and total fat and have adequate dietary potassium and calcium preferably from fresh fruits, vegetables, and low-fat dairy products.   - Advised to avoid cigarette smoking. - Discussed with the patient that most people either abstain from alcohol or drink within safe limits (<=14/week and <=4 drinks/occasion for males, <=7/weeks and <= 3 drinks/occasion for females) and that the risk for alcohol disorders and other health effects rises proportionally with the number of drinks per week and how often a drinker exceeds daily limits. - Discussed cessation/primary prevention of drug use and availability of treatment for abuse.  - Discussed sexually transmitted diseases, avoidance of unintended pregnancy and contraceptive alternatives.  - Stressed the importance of regular exercise - Injury prevention: Discussed safety belts, safety helmets, smoke detector, smoking near bedding or upholstery.  - Dental health: Discussed importance of regular tooth brushing, flossing, and dental visits.  NEXT PREVENTATIVE PHYSICAL DUE IN 1 YEAR.  Return in about 1 year (around 04/08/2024) for ANNUAL PHYSICAL (fasting labs prior) .  Patient to reach out to office if new, worrisome, or unresolved  symptoms arise or if no improvement in patient's condition. Patient verbalized understanding and is agreeable to treatment plan. All questions answered to patient's satisfaction.    Yolanda Manges, FNP

## 2023-04-10 ENCOUNTER — Other Ambulatory Visit (HOSPITAL_BASED_OUTPATIENT_CLINIC_OR_DEPARTMENT_OTHER): Payer: Self-pay | Admitting: Family Medicine

## 2023-04-10 ENCOUNTER — Encounter (HOSPITAL_BASED_OUTPATIENT_CLINIC_OR_DEPARTMENT_OTHER): Payer: Self-pay | Admitting: Family Medicine

## 2023-04-10 ENCOUNTER — Encounter (INDEPENDENT_AMBULATORY_CARE_PROVIDER_SITE_OTHER): Payer: Medicaid Other | Admitting: Family Medicine

## 2023-04-10 DIAGNOSIS — E559 Vitamin D deficiency, unspecified: Secondary | ICD-10-CM

## 2023-04-10 MED ORDER — VITAMIN D (ERGOCALCIFEROL) 1.25 MG (50000 UNIT) PO CAPS
50000.0000 [IU] | ORAL_CAPSULE | ORAL | 3 refills | Status: DC
Start: 2023-04-10 — End: 2024-05-11

## 2023-04-10 NOTE — Progress Notes (Signed)
Patient's TSH, CBC, CMP, A1c are within normal limits. Elevated cholesterol - avoid greasy/fatty foods. Adhere to a diet with lean proteins, vegetables, fruits, and low carbohydrates. Drink plenty of water. Regular exercise.   Follow up as discussed per visit. I have refilled her Vitamin D.

## 2023-04-14 ENCOUNTER — Telehealth (HOSPITAL_BASED_OUTPATIENT_CLINIC_OR_DEPARTMENT_OTHER): Payer: Self-pay | Admitting: Family Medicine

## 2023-04-14 NOTE — Telephone Encounter (Signed)
Hilbert Bible, FNP  Anniebelle Devore, Farley Ly, CMA Please let pt know her PAP does need to be redone as there was not an adequate enough sample to test the area of the cervix that has changes most often. She can schedule this for any time.    **When pt calls back, we need to let her know about lab results and also about the info in regards to her PAP.**

## 2023-04-14 NOTE — Progress Notes (Signed)
Please let pt know her PAP does need to be redone as there was not an adequate enough sample to test the area of the cervix that has changes most often. She can schedule this for any time.

## 2023-04-14 NOTE — Telephone Encounter (Signed)
Caudle, Shelton Silvas, FNP  [facility]   Patient's TSH, CBC, CMP, A1c are within normal limits. Elevated cholesterol - avoid greasy/fatty foods. Adhere to a diet with lean proteins, vegetables, fruits, and low carbohydrates. Drink plenty of water. Regular exercise.  Follow up as discussed per visit. I have refilled her Vitamin D.    Attempted to call pt to go over the above lab results but unable to reach. Left message for her to return call.

## 2023-04-14 NOTE — Telephone Encounter (Signed)
Patient states she had a message to call in for lab results. Please return call to go over recent labs.

## 2023-04-16 ENCOUNTER — Encounter (HOSPITAL_BASED_OUTPATIENT_CLINIC_OR_DEPARTMENT_OTHER): Payer: Self-pay | Admitting: Family Medicine

## 2023-04-16 DIAGNOSIS — R600 Localized edema: Secondary | ICD-10-CM | POA: Insufficient documentation

## 2023-04-16 MED ORDER — FUROSEMIDE 20 MG PO TABS: 20.0000 mg | ORAL_TABLET | Freq: Every day | ORAL | 1 refills | Status: DC | PRN

## 2023-04-16 MED ORDER — OMEPRAZOLE 20 MG PO CPDR: 20.0000 mg | DELAYED_RELEASE_CAPSULE | Freq: Two times a day (BID) | ORAL | 1 refills | Status: DC

## 2023-04-16 NOTE — Addendum Note (Signed)
Addended by: Wyvonne Lenz on: 04/16/2023 10:05 AM   Modules accepted: Orders

## 2023-04-16 NOTE — Therapy (Incomplete)
OUTPATIENT PHYSICAL THERAPY THORACOLUMBAR TREATMENT   Patient Name: Linda Odom MRN: [id number] DOB:Nov 13, 1980, 42 y.o., female Today's Date: 04/16/2023  END OF SESSION:    Past Medical History:  Diagnosis Date   Anxiety    Arthritis    Bipolar 1 disorder (HCC)    Breast nodule    Depression    Gastric ulcer    No pertinent past medical history    Osteoarthritis    Vitamin D deficiency    Past Surgical History:  Procedure Laterality Date   CHOLECYSTECTOMY     LAPAROSCOPIC APPENDECTOMY N/A 11/13/2013   Procedure: LAP ASSISTED PARTIAL CECECTOMY;  Surgeon: Adolph Pollack, MD;  Location: [facility];  Service: General;  Laterality: N/A;   leg sugery  1994   TUBAL LIGATION  2006   Patient Active Problem List   Diagnosis Date Noted   Bilateral lower extremity edema 04/16/2023   Thyromegaly 04/09/2023   Class 3 severe obesity due to excess calories with serious comorbidity and body mass index (BMI) of 40.0 to 44.9 in adult (HCC) 03/26/2023   Vitamin D deficiency 03/26/2023   Chronic low back pain without sciatica 03/26/2023   Elevated cholesterol 12/31/2021   Acute appendicitis 11/13/2013   Acute appendicitis, unspecified acute appendicitis type 11/13/2013    PCP: Hilbert Bible, FNP   REFERRING PROVIDER: Hilbert Bible, *   REFERRING DIAG: [id number] (ICD-10-CM) - Chronic low back pain without sciatica, unspecified back pain laterality   Rationale for Evaluation and Treatment: Rehabilitation  THERAPY DIAG:  No diagnosis found.  ONSET DATE: one month ago, but has had pain for 6 months or more  SUBJECTIVE:                                                                                                                                                                                           SUBJECTIVE STATEMENT: ***  Eval: Patient reports stiffness in low back x one month. She is a Engineer, civil (consulting) for mentally challenged adults x 4 months, so doing a lot of  pulling and lifting. Was working at [employer] job but couldn't tolerate the standing.  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 Yes: NPRS scale: 7-8/10 Pain location: low back sometimes in to hips Pain description: pinching Aggravating factors: pulling and lifting Relieving factors: ice, meds, stretching  PRECAUTIONS: None  RED FLAGS: None   WEIGHT BEARING RESTRICTIONS: No  FALLS:  Has patient fallen in last 6 months? No  LIVING ENVIRONMENT: Lives with: lives alone Lives in: House/apartment Stairs: No Has following equipment at home: None  OCCUPATION: nurse  PLOF: Independent  PATIENT GOALS: reduce her pain  NEXT MD VISIT: 2 weeks  OBJECTIVE:   DIAGNOSTIC FINDINGS:  XR mild degenerative changes L5-S1   COGNITION: Overall cognitive status: Within functional limits for tasks assessed .Marland Kitchen  PATIENT SURVEYS: Modified Oswestry 13 / 50 = 26.0 %      SENSATION: WFL  MUSCLE LENGTH: Marked B HS, quads and gluteal tightness L>R  POSTURE: increased lumbar lordosis  PALPATION: TTP in B gluteals along iliac crest; B lumbar Spinal mobility WNL  LUMBAR ROM: WNL   LOWER EXTREMITY ROM:   Restricted by tight muscles (see above)  Active  Right eval Left eval  Hip flexion    Hip extension    Hip abduction    Hip adduction    Hip internal rotation    Hip external rotation    Knee flexion    Knee extension    Ankle dorsiflexion    Ankle plantarflexion    Ankle inversion    Ankle eversion     (Blank rows = not tested)  LOWER EXTREMITY MMT:    MMT Right eval Left eval  Hip flexion 5 4+  Hip extension 5 4-  Hip abduction 5 4-  Hip adduction 5 4-  Hip internal rotation    Hip external rotation    Knee flexion 4+ 4+  Knee extension 5 5  Ankle dorsiflexion 5 5  Ankle plantarflexion    Ankle inversion    Ankle eversion     (Blank rows = not tested)  LUMBAR SPECIAL TESTS:  Straight leg raise test:  Negative  FUNCTIONAL TESTS:  5 times sit to stand: 18.38   TODAY'S TREATMENT:                                                                                                                              DATE:   04/17/23   03/27/23 EVAL ONLY  PATIENT EDUCATION:  Education details: PT eval findings and anticipated POC  Person educated: Patient Education method: Explanation Education comprehension: verbalized understanding  HOME EXERCISE PROGRAM: TBD  ASSESSMENT:  CLINICAL IMPRESSION: ***   OBJECTIVE IMPAIRMENTS: decreased activity tolerance, decreased balance, decreased ROM, decreased strength, increased muscle spasms, impaired flexibility, postural dysfunction, obesity, and pain.   ACTIVITY LIMITATIONS: standing and caring for others  PARTICIPATION LIMITATIONS: occupation  PERSONAL FACTORS: Fitness and 1-2 comorbidities: knee OA  are also affecting patient's functional outcome.   REHAB POTENTIAL: Excellent  CLINICAL DECISION MAKING: Stable/uncomplicated  EVALUATION COMPLEXITY: Low   GOALS: Goals reviewed with patient? Yes  SHORT TERM GOALS: Target date: 04/25/2023   Patient will be independent with initial HEP.  Baseline: no HEP Goal status: INITIAL   LONG TERM GOALS: Target date: 05/23/2023   Patient will be independent with advanced/ongoing HEP to improve outcomes and carryover.  Baseline: initial HEP Goal status: INITIAL  2.  Patient will report 75% improvement in low back pain to improve QOL.  Baseline: 7-8/10 pain Goal  status: INITIAL  3.  Patient will report 7/50 on lumbar Modified Oswetry to demonstrate improved functional ability.   Baseline: 13 / 50 = 26.0 % Goal status: INITIAL  4.  Patient will demonstrate improved strength to >= 4+/5 to normalize balance. Baseline: see objective chart Goal status: INITIAL  PLAN:  PT FREQUENCY: 2x/week  PT DURATION: 8 weeks  PLANNED INTERVENTIONS: Therapeutic exercises, Therapeutic activity,  Neuromuscular re-education, Balance training, Patient/Family education, Self Care, Joint mobilization, Aquatic Therapy, Dry Needling, Electrical stimulation, Spinal mobilization, Cryotherapy, Moist heat, Traction, Manual therapy, and Re-evaluation.  PLAN FOR NEXT SESSION: Focus on hip/leg flexibility, core and lumbar stab, L LE strength   Solon Palm, PT 04/16/2023, 9:32 PM

## 2023-04-16 NOTE — Telephone Encounter (Signed)
Called and spoke with pt about phentermine and let her know recommendations per Jon Gills and she verbalized understanding. Nothing further needed.

## 2023-04-16 NOTE — Telephone Encounter (Signed)
Pt called the office. Gave her the results of recent bloodwork and told her that Vitamin D Rx was sent to pharmacy for her. Pt also needed to have her furosemide and omeprazole refilled so per okay by Jon Gills, meds have also been filled.   Stated to pt that she will need to have PAP repeated due to not having adequate sample and pt said she would call back to reschedule.  While speaking with pt, she said she was needing to have Rx for phentermine sent to the pharmacy. Looked at pt's prior med history and saw that pt used to be on phentermine 37.5mg .  Alexis, please advise on this med.

## 2023-04-17 ENCOUNTER — Telehealth (HOSPITAL_BASED_OUTPATIENT_CLINIC_OR_DEPARTMENT_OTHER): Payer: Self-pay | Admitting: Family Medicine

## 2023-04-17 ENCOUNTER — Ambulatory Visit: Payer: Medicaid Other | Admitting: Physical Therapy

## 2023-04-17 NOTE — Telephone Encounter (Signed)
Called and spoke with pt letting her know the other place she could all for weight management and she verbalized understanding. Nothing further needed.

## 2023-04-17 NOTE — Telephone Encounter (Addendum)
Alexis, please advise if you know of somewhere else patient can be referred to for weight management.

## 2023-04-17 NOTE — Telephone Encounter (Signed)
PATIENT CALLING SAYING SHE CANNOT GET INTO pt UNTIL SEPTEMBER AND CAN YOU REFER SOMEWHERE ELSE.

## 2023-07-21 ENCOUNTER — Other Ambulatory Visit (HOSPITAL_BASED_OUTPATIENT_CLINIC_OR_DEPARTMENT_OTHER): Payer: Self-pay | Admitting: Family Medicine

## 2023-08-03 ENCOUNTER — Other Ambulatory Visit (HOSPITAL_BASED_OUTPATIENT_CLINIC_OR_DEPARTMENT_OTHER): Payer: Self-pay | Admitting: Family Medicine

## 2023-10-13 ENCOUNTER — Encounter (INDEPENDENT_AMBULATORY_CARE_PROVIDER_SITE_OTHER): Payer: Self-pay

## 2023-10-13 ENCOUNTER — Ambulatory Visit (INDEPENDENT_AMBULATORY_CARE_PROVIDER_SITE_OTHER): Payer: Medicaid Other | Admitting: Family Medicine

## 2023-10-13 DIAGNOSIS — Z91199 Patient's noncompliance with other medical treatment and regimen due to unspecified reason: Secondary | ICD-10-CM

## 2023-10-13 HISTORY — DX: Patient's noncompliance with other medical treatment and regimen due to unspecified reason: Z91.199

## 2023-10-13 NOTE — Progress Notes (Signed)
No-show [facility]

## 2023-12-13 ENCOUNTER — Emergency Department (HOSPITAL_COMMUNITY)
Admission: EM | Admit: 2023-12-13 | Discharge: 2023-12-13 | Disposition: A | Discharge status: Home / Self Care | Attending: Emergency Medicine | Admitting: Emergency Medicine

## 2023-12-13 ENCOUNTER — Other Ambulatory Visit: Payer: Self-pay

## 2023-12-13 DIAGNOSIS — M792 Neuralgia and neuritis, unspecified: Secondary | ICD-10-CM | POA: Insufficient documentation

## 2023-12-13 DIAGNOSIS — R799 Abnormal finding of blood chemistry, unspecified: Secondary | ICD-10-CM | POA: Diagnosis not present

## 2023-12-13 DIAGNOSIS — G5793 Unspecified mononeuropathy of bilateral lower limbs: Secondary | ICD-10-CM

## 2023-12-13 DIAGNOSIS — M79604 Pain in right leg: Secondary | ICD-10-CM | POA: Diagnosis present

## 2023-12-13 LAB — CBC WITH DIFFERENTIAL/PLATELET
Abs Immature Granulocytes: 0.01 K/uL (ref 0.00–0.07)
Basophils Absolute: 0 K/uL (ref 0.0–0.1)
Basophils Relative: 1 %
Eosinophils Absolute: 0.1 K/uL (ref 0.0–0.5)
Eosinophils Relative: 2 %
HCT: 38 % (ref 36.0–46.0)
Hemoglobin: 11.7 g/dL — ABNORMAL LOW (ref 12.0–15.0)
Immature Granulocytes: 0 %
Lymphocytes Relative: 42 %
Lymphs Abs: 2.5 K/uL (ref 0.7–4.0)
MCH: 27 pg (ref 26.0–34.0)
MCHC: 30.8 g/dL (ref 30.0–36.0)
MCV: 87.6 fL (ref 80.0–100.0)
Monocytes Absolute: 0.4 K/uL (ref 0.1–1.0)
Monocytes Relative: 7 %
Neutro Abs: 3 K/uL (ref 1.7–7.7)
Neutrophils Relative %: 48 %
Platelets: 220 K/uL (ref 150–400)
RBC: 4.34 MIL/uL (ref 3.87–5.11)
RDW: 15.7 % — ABNORMAL HIGH (ref 11.5–15.5)
WBC: 6 K/uL (ref 4.0–10.5)
nRBC: 0 % (ref 0.0–0.2)

## 2023-12-13 LAB — COMPREHENSIVE METABOLIC PANEL WITH GFR
ALT: 20 U/L (ref 0–44)
AST: 17 U/L (ref 15–41)
Albumin: 3.4 g/dL — ABNORMAL LOW (ref 3.5–5.0)
Alkaline Phosphatase: 52 U/L (ref 38–126)
Anion gap: 6 (ref 5–15)
BUN: 12 mg/dL (ref 6–20)
CO2: 23 mmol/L (ref 22–32)
Calcium: 9 mg/dL (ref 8.9–10.3)
Chloride: 110 mmol/L (ref 98–111)
Creatinine, Ser: 0.99 mg/dL (ref 0.44–1.00)
GFR, Estimated: >60 mL/min (ref >60)
Glucose, Bld: 108 mg/dL — ABNORMAL HIGH (ref 70–99)
Potassium: 3.9 mmol/L (ref 3.5–5.1)
Sodium: 139 mmol/L (ref 135–145)
Total Bilirubin: 0.5 mg/dL (ref 0.0–1.2)
Total Protein: 7.1 g/dL (ref 6.5–8.1)

## 2023-12-13 LAB — CBG MONITORING, ED: Glucose-Capillary: 106 mg/dL — ABNORMAL HIGH (ref 70–99)

## 2023-12-13 MED ORDER — GABAPENTIN 100 MG PO CAPS
100.0000 mg | ORAL_CAPSULE | Freq: Once | ORAL | Status: AC
  Administered 2023-12-13: 100 mg via ORAL
  Filled 2023-12-13: qty 1

## 2023-12-13 MED ORDER — GABAPENTIN 100 MG PO CAPS: 100.0000 mg | ORAL_CAPSULE | Freq: Three times a day (TID) | ORAL | 0 refills | Status: AC

## 2023-12-13 NOTE — Discharge Instructions (Addendum)
 You were seen in the ER today for concerns of burning pain in the legs. Your labs were thankfully normal today. Your symptoms appear to be most consistent with neuropathic pain given the generalized burning you are feeling in the absence of calf pain. I have sent a prescription for gabapentin which is for neuropathic pain. Please take this as prescribed and follow up with your primary care provider. For concerns of new or worsening symptoms, return to the ER.

## 2023-12-13 NOTE — ED Triage Notes (Signed)
 Pt arrived via POV. C/o bilat LE pain for 3x days. Pt states if feels like a burning feeling.

## 2023-12-13 NOTE — ED Provider Notes (Signed)
 [place] EMERGENCY DEPARTMENT AT [facility] Provider Note   CSN: [id number] Arrival date & time: 12/13/23  1710     History Chief Complaint  Patient presents with   Leg Pain    Linda Odom is a 43 y.o. female.  Patient with past history significant for elevated cholesterol, vitamin D deficiency, thyromegaly, bilateral lower extremity edema who presents to the emergency department with concerns of leg pain.  She reports experiencing bilateral lower extremity burning type sensation for the last 2 or 3 days.  She states that she has not sustained any injury recently.  Denies any history of diabetes.  Not currently on any medications.  Denies any notable leg swelling.   Leg Pain      Home Medications Prior to Admission medications   Medication Sig Start Date End Date Taking? Authorizing Provider  gabapentin (NEURONTIN) 100 MG capsule Take 1 capsule (100 mg total) by mouth 3 (three) times daily for 15 days. 12/13/23 12/28/23 Yes Smitty Knudsen, PA-C  albuterol (VENTOLIN HFA) 108 (90 Base) MCG/ACT inhaler Inhale 1-2 puffs into the lungs every 6 (six) hours as needed for wheezing or shortness of breath. 06/18/19   Wieters, Hallie C, PA-C  benzonatate (TESSALON) 100 MG capsule Take 1 capsule (100 mg total) by mouth 3 (three) times daily as needed for cough. 10/14/19   Lorelee New, PA-C  buPROPion (WELLBUTRIN SR) 200 MG 12 hr tablet Take 1 tablet (200 mg total) by mouth daily. 01/30/22   Irene Limbo, FNP  Cetirizine HCl 10 MG CAPS Take 1 capsule (10 mg total) by mouth daily. 06/18/19   Wieters, Hallie C, PA-C  cyclobenzaprine (FLEXERIL) 10 MG tablet Take 1 tablet (10 mg total) by mouth 3 (three) times daily as needed for muscle spasms. 04/09/23   Caudle, Shelton Silvas, FNP  fluticasone (FLONASE) 50 MCG/ACT nasal spray Place into both nostrils. 01/11/22   [provider]  furosemide (LASIX) 20 MG tablet TAKE 1 TABLET BY MOUTH EVERY DAY AS NEEDED FOR FLUID 08/04/23    Caudle, Shelton Silvas, FNP  HYDROcodone-acetaminophen (NORCO) 5-325 MG tablet Take 1 tablet by mouth 2 (two) times daily as needed. 04/25/22   Cristie Hem, PA-C  ibuprofen (ADVIL) 800 MG tablet TAKE 1 TABLET BY MOUTH EVERY 8 HOURS AS NEEDED 07/22/23   Caudle, Shelton Silvas, FNP  levocetirizine (XYZAL) 5 MG tablet Take 5 mg by mouth at bedtime. 10/06/19   [provider]  omeprazole (PRILOSEC) 20 MG capsule Take 1 capsule (20 mg total) by mouth 2 (two) times daily. 04/16/23   Caudle, Shelton Silvas, FNP  ondansetron (ZOFRAN ODT) 4 MG disintegrating tablet Take 1 tablet (4 mg total) by mouth every 8 (eight) hours as needed for nausea or vomiting. 10/14/19   Lorelee New, PA-C  Vitamin D, Ergocalciferol, (DRISDOL) 1.25 MG (50000 UNIT) CAPS capsule Take 1 capsule (50,000 Units total) by mouth once a week. 04/10/23   Hilbert Bible, FNP      Allergies    Patient has no known allergies.    Review of Systems   Review of Systems  Neurological:        Burning in legs  All other systems reviewed and are negative.   Physical Exam Updated Vital Signs BP 100/68   Pulse 82   Temp 98.4 F (36.9 C) (Oral)   Resp 18   Ht 5\' 4"  (1.626 m)   Wt 104.3 kg   SpO2 96%   BMI 39.48 kg/m  Physical Exam Vitals and nursing note reviewed.  Constitutional:      General: She is not in acute distress.    Appearance: She is well-developed.  HENT:     Head: Normocephalic and atraumatic.  Eyes:     Conjunctiva/sclera: Conjunctivae normal.  Cardiovascular:     Rate and Rhythm: Normal rate and regular rhythm.     Heart sounds: No murmur heard.    Comments: No appreciable lower extremity edema and no unilateral leg swelling. Pulmonary:     Effort: Pulmonary effort is normal. No respiratory distress.     Breath sounds: Normal breath sounds.  Abdominal:     Palpations: Abdomen is soft.     Tenderness: There is no abdominal tenderness.  Musculoskeletal:        General: No swelling.      Cervical back: Neck supple.     Right lower leg: No edema.     Left lower leg: No edema.  Skin:    General: Skin is warm and dry.     Capillary Refill: Capillary refill takes less than 2 seconds.  Neurological:     General: No focal deficit present.     Mental Status: She is alert and oriented to person, place, and time. Mental status is at baseline.     Cranial Nerves: No cranial nerve deficit.     Sensory: No sensory deficit.     Motor: No weakness.  Psychiatric:        Mood and Affect: Mood normal.     ED Results / Procedures / Treatments   Labs (all labs ordered are listed, but only abnormal results are displayed) Labs Reviewed  CBC WITH DIFFERENTIAL/PLATELET - Abnormal; Notable for the following components:      Result Value   Hemoglobin 11.7 (*)    RDW 15.7 (*)    All other components within normal limits  COMPREHENSIVE METABOLIC PANEL WITH GFR - Abnormal; Notable for the following components:   Glucose, Bld 108 (*)    Albumin 3.4 (*)    All other components within normal limits  CBG MONITORING, ED - Abnormal; Notable for the following components:   Glucose-Capillary 106 (*)    All other components within normal limits    EKG None  Radiology No results found.  Procedures Procedures    Medications Ordered in ED Medications  gabapentin (NEURONTIN) capsule 100 mg (has no administration in time range)    ED Course/ Medical Decision Making/ A&P                                 Medical Decision Making Amount and/or Complexity of Data Reviewed Labs: ordered.   This patient presents to the ED for concern of leg pain.  Differential diagnosis includes neuropathy, DVT, cellulitis, dehydration, muscle cramping   Lab Tests:  I Ordered, and personally interpreted labs.  The pertinent results include: CBG 106, CBC unremarkable, CMP unremarkable   Medicines ordered and prescription drug management:  I ordered medication including gabapentin for pain   Reevaluation of the patient after these medicines showed that the patient improved I have reviewed the patients home medicines and have made adjustments as needed   Problem List / ED Course:  Patient with past history significant for arthritis, osteoarthritis, vitamin D deficiency, anxiety, depression presents to the emergency department today with concerns of burning in legs.  Reports has been ongoing for the last 2 or 3  days.  Denies any inciting factors.  No history of diabetes.  She reports that she had sudden onset of the symptoms without any appreciable leg pain or leg swelling.  No prior history of PE or DVT.  Not on blood thinners. Physical exam is unremarkable.  No acute neurological deficits of the lower extremities.  No palpable tenderness to the calf in either lower extremity.  No pitting edema.  No unilateral leg swelling.  No erythema of the lower extremities.  Plus pulses bilaterally at the PT and DP sites.  Will proceed with basic labs for assessment of possible electrolyte deficiencies contributing to neuropathy type symptoms.  Unclear patient may have underlying diabetes that has been undiagnosed causing the symptoms. Basic labs unremarkable.  No significant electrolyte derangements.  Suspect this is likely neuropathic pain but unclear source.  Glucose levels are normal for random testing making undiagnosed diabetes unlikely.  Advised patient need for continued follow-up with primary care provider for further evaluation and testing. Prescription for low dose gabapentin sent to pharmacy after discussion of possible further testing. Given symptoms not indicative of DVT, no indication for ultrasound imaging. Patient will plan on close PCP follow up. Otherwise stable at this time for discharge home and outpatient follow up.   Final Clinical Impression(s) / ED Diagnoses Final diagnoses:  Neuropathic pain of both legs    Rx / DC Orders ED Discharge Orders          Ordered     gabapentin (NEURONTIN) 100 MG capsule  3 times daily        12/13/23 1906              Salomon Mast 12/13/23 1911    Ernie Avena, MD 12/13/23 [phone]

## 2023-12-16 ENCOUNTER — Ambulatory Visit (INDEPENDENT_AMBULATORY_CARE_PROVIDER_SITE_OTHER): Admitting: *Deleted

## 2023-12-16 ENCOUNTER — Ambulatory Visit: Payer: Self-pay

## 2023-12-16 VITALS — Ht 64.0 in | Wt 245.6 lb

## 2023-12-16 DIAGNOSIS — Z111 Encounter for screening for respiratory tuberculosis: Secondary | ICD-10-CM | POA: Diagnosis not present

## 2023-12-16 NOTE — Telephone Encounter (Signed)
 Chief Complaint: numbness and pain in legs Symptoms: numbness and pain in legs Frequency: x 1 week Pertinent Negatives: Patient denies chest pain Disposition: [] ED /[] Urgent Care (no appt availability in office) / [x] Appointment(In office/virtual)/ []  [facility]/ [] Home Care/ [] Refused Recommended Disposition /[] [place] [facility]/ []  Follow-up with PCP Additional Notes: Pt states that on last week maybe Wednesday she started experiencing like cramping pains in her legs. States that it got worse and by Saturday she could not walk and had to go to ED. States that she was given medication at ED for neuropathy but medication is not helping. States she is still having the numbness and pain in her legs and it is painful to walk.   Copied from CRM [id number]. Topic: Clinical - Red Word Triage >> Dec 16, 2023  8:14 AM Franchot Heidelberg wrote: Red Word that prompted transfer to Nurse Triage: Numbness in legs, possible nerve issues. Medications not helping Reason for Disposition  [1] Numbness or tingling on both sides of body AND [2] is a new symptom present > 24 hours  Answer Assessment - Initial Assessment Questions 1. SYMPTOM: "What is the main symptom you are concerned about?" (e.g., weakness, numbness)     Numbness and pain in legs 2. ONSET: "When did this start?" (minutes, hours, days; while sleeping)     wednesday 3. LAST NORMAL: "When was the last time you (the patient) were normal (no symptoms)?"     tuesday 4. PATTERN "Does this come and go, or has it been constant since it started?"  "Is it present now?"     constant 5. CARDIAC SYMPTOMS: "Have you had any of the following symptoms: chest pain, difficulty breathing, palpitations?"     no 6. NEUROLOGIC SYMPTOMS: "Have you had any of the following symptoms: headache, dizziness, vision loss, double vision, changes in speech, unsteady on your feet?"     no 7. OTHER SYMPTOMS: "Do you have any other symptoms?"     no  Protocols used:  Neurologic Deficit-A-AH

## 2023-12-19 ENCOUNTER — Ambulatory Visit (HOSPITAL_BASED_OUTPATIENT_CLINIC_OR_DEPARTMENT_OTHER)

## 2024-01-14 ENCOUNTER — Encounter (HOSPITAL_BASED_OUTPATIENT_CLINIC_OR_DEPARTMENT_OTHER): Payer: Self-pay | Admitting: Family Medicine

## 2024-01-14 ENCOUNTER — Ambulatory Visit (HOSPITAL_BASED_OUTPATIENT_CLINIC_OR_DEPARTMENT_OTHER): Admitting: Family Medicine

## 2024-01-14 VITALS — BP 114/72 | HR 71 | Ht 64.0 in | Wt 248.0 lb

## 2024-01-14 DIAGNOSIS — R5382 Chronic fatigue, unspecified: Secondary | ICD-10-CM

## 2024-01-14 DIAGNOSIS — R252 Cramp and spasm: Secondary | ICD-10-CM | POA: Diagnosis not present

## 2024-01-14 DIAGNOSIS — G8929 Other chronic pain: Secondary | ICD-10-CM | POA: Diagnosis not present

## 2024-01-14 DIAGNOSIS — M545 Low back pain, unspecified: Secondary | ICD-10-CM

## 2024-01-14 MED ORDER — CYCLOBENZAPRINE HCL 10 MG PO TABS: 10.0000 mg | ORAL_TABLET | Freq: Three times a day (TID) | ORAL | 0 refills | Status: DC | PRN

## 2024-01-14 NOTE — Progress Notes (Signed)
 Acute Care Office Visit  Subjective:   Linda Odom [birth year] 01/14/2024  Chief Complaint  Patient presents with   leg numbness    Patient has been having some numbness and pain in her legs. States she did go to the ED due to the symptoms. States the symptoms come and go.    HPI: Patient states she has been having numbness and tingling in her bilateral legs that is intermittent. She reports chronic back pain and 'stiffness". 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 She is exercising regularly. She went to ER on 4/5 for numbness and tingling and had work up with unremarkable labs. Her glucose levels were normal. No indication for DVT study seen on exam. She denies swelling to lower extremities. She was placed on Gabapentin  but is unable to take due to side effect of drowsiness.   She is concerned about worsening chronic fatigue that is beginning to impact her daily activities and energy. She reports hx of iron deficiency and has chronic vitamin D  insufficiency. She reports she has been taking OTC Vitamin D  Capsules for supplementation.   The following portions of the patient's history were reviewed and updated as appropriate: past medical history, past surgical history, family history, social history, allergies, medications, and problem list.   Patient Active Problem List   Diagnosis Date Noted   Bilateral lower extremity edema 04/16/2023   Thyromegaly 04/09/2023   Class 3 severe obesity due to excess calories with serious comorbidity and body mass index (BMI) of 40.0 to 44.9 in adult 03/26/2023   Vitamin D  deficiency 03/26/2023   Chronic low back pain without sciatica 03/26/2023   Elevated cholesterol 12/31/2021   Past Medical History:  Diagnosis Date   Acute appendicitis 11/13/2013   Acute appendicitis, unspecified acute appendicitis type 11/13/2013   Anxiety    Arthritis    Bipolar 1 disorder (HCC)    Breast nodule    Depression    Gastric  ulcer    No pertinent past medical history    No-show for appointment-healthy weight and wellness 10/13/2023   Osteoarthritis    Vitamin D  deficiency    Past Surgical History:  Procedure Laterality Date   CHOLECYSTECTOMY     LAPAROSCOPIC APPENDECTOMY N/A 11/13/2013   Procedure: LAP ASSISTED PARTIAL CECECTOMY;  Surgeon: Harlee Lichtenstein, MD;  Location: [facility];  Service: General;  Laterality: N/A;   leg sugery  1994   TUBAL LIGATION  2006   Family History  Problem Relation Age of Onset   Hypertension Mother    Diabetes Mother    High Cholesterol Mother    Sudden death Mother    Stroke Mother    Depression Mother    Anxiety disorder Mother    Hypertension Father    Asthma Daughter    Asthma Son    Hypertension Maternal Grandmother    Other Neg Hx    Outpatient Medications Prior to Visit  Medication Sig Dispense Refill   albuterol  (VENTOLIN  HFA) 108 (90 Base) MCG/ACT inhaler Inhale 1-2 puffs into the lungs every 6 (six) hours as needed for wheezing or shortness of breath. 8 g 0   benzonatate  (TESSALON ) 100 MG capsule Take 1 capsule (100 mg total) by mouth 3 (three) times daily as needed for cough. 21 capsule 0   buPROPion  (WELLBUTRIN  SR) 200 MG 12 hr tablet Take 1 tablet (200 mg total) by mouth daily. 30 tablet 0   Cetirizine  HCl 10 MG CAPS  Take 1 capsule (10 mg total) by mouth daily. 15 capsule 0   fluticasone  (FLONASE ) 50 MCG/ACT nasal spray Place into both nostrils.     furosemide  (LASIX ) 20 MG tablet TAKE 1 TABLET BY MOUTH EVERY DAY AS NEEDED FOR FLUID 90 tablet 1   HYDROcodone -acetaminophen  (NORCO) 5-325 MG tablet Take 1 tablet by mouth 2 (two) times daily as needed. 10 tablet 0   ibuprofen  (ADVIL ) 800 MG tablet TAKE 1 TABLET BY MOUTH EVERY 8 HOURS AS NEEDED 30 tablet 1   levocetirizine (XYZAL) 5 MG tablet Take 5 mg by mouth at bedtime.     omeprazole  (PRILOSEC ) 20 MG capsule Take 1 capsule (20 mg total) by mouth 2 (two) times daily. 180 capsule 1   ondansetron  (ZOFRAN  ODT) 4  MG disintegrating tablet Take 1 tablet (4 mg total) by mouth every 8 (eight) hours as needed for nausea or vomiting. 20 tablet 0   Vitamin D , Ergocalciferol , (DRISDOL ) 1.25 MG (50000 UNIT) CAPS capsule Take 1 capsule (50,000 Units total) by mouth once a week. 6 capsule 3   cyclobenzaprine  (FLEXERIL ) 10 MG tablet Take 1 tablet (10 mg total) by mouth 3 (three) times daily as needed for muscle spasms. 30 tablet 1   gabapentin  (NEURONTIN ) 100 MG capsule Take 1 capsule (100 mg total) by mouth 3 (three) times daily for 15 days. 45 capsule 0   No facility-administered medications prior to visit.   No Known Allergies   ROS: A complete ROS was performed with pertinent positives/negatives noted in the HPI. The remainder of the ROS are negative.    Objective:   Today's Vitals   01/14/24 1305  BP: 114/72  Pulse: 71  SpO2: 98%  Weight: 248 lb (112.5 kg)  Height: 5\' 4"  (1.626 m)    GENERAL: Well-appearing, in NAD. Well nourished.  SKIN: Pink, warm and dry.  Head: Normocephalic. NECK: Trachea midline. Full ROM w/o pain or tenderness.  RESPIRATORY: Chest wall symmetrical. Respirations even and non-labored. MSK: Muscle tone and strength appropriate for age. No step off, deformity or crepitus to spine. Mild paraspinal muscle tenderness present to right lumbar spine. Negative straight leg raise testing.  EXTREMITIES: Without clubbing, cyanosis, or edema.  NEUROLOGIC: No motor or sensory deficits. Steady, even gait. C2-C12 intact.  PSYCH/MENTAL STATUS: Alert, oriented x 3. Cooperative, appropriate mood and affect.    No results found for any visits on 01/14/24.    Assessment & Plan:  1. Chronic low back pain without sciatica, unspecified back pain laterality (Primary) Discussed likely degenerative changes that may be causing nerve impingement intermittently. Recommended updated lumbar spine xray, patient declined today. She would like to try PT, NSAID therapy , ice, heat, stretching first. Ref to  PT placed at [facility]. Flexeril  provided for patient to use PRN for muscle spasm.   - Ambulatory referral to Physical Therapy - cyclobenzaprine  (FLEXERIL ) 10 MG tablet; Take 1 tablet (10 mg total) by mouth 3 (three) times daily as needed for muscle spasms.  Dispense: 30 tablet; Refill: 0  2. Chronic fatigue Discussed possible causes including OSA, anemia, B12, and Vitamin D . Mild anemia present on last CBC on 12/13/23. Will obtain labs today to evaluate for possible cause and notify of results when available.  - VITAMIN D  25 Hydroxy (Vit-D Deficiency, Fractures) - Vitamin B12 - Iron, TIBC and Ferritin Panel   Meds ordered this encounter  Medications   cyclobenzaprine  (FLEXERIL ) 10 MG tablet    Sig: Take 1 tablet (10 mg total) by mouth  3 (three) times daily as needed for muscle spasms.    Dispense:  30 tablet    Refill:  0    Supervising Provider:   DE Peru, RAYMOND J [[id number]]   Lab Orders         VITAMIN D  25 Hydroxy (Vit-D Deficiency, Fractures)         Vitamin B12         Iron, TIBC and Ferritin Panel      Return if symptoms worsen or fail to improve.    Patient to reach out to office if new, worrisome, or unresolved symptoms arise or if no improvement in patient's condition. Patient verbalized understanding and is agreeable to treatment plan. All questions answered to patient's satisfaction.    Nonda Bays, [place]

## 2024-01-15 ENCOUNTER — Encounter (HOSPITAL_BASED_OUTPATIENT_CLINIC_OR_DEPARTMENT_OTHER): Payer: Self-pay | Admitting: Family Medicine

## 2024-01-15 LAB — IRON,TIBC AND FERRITIN PANEL
Ferritin: 17 ng/mL (ref 15–150)
Iron Saturation: 15 % (ref 15–55)
Iron: 53 ug/dL (ref 27–159)
Total Iron Binding Capacity: 353 ug/dL (ref 250–450)
UIBC: 300 ug/dL (ref 131–425)

## 2024-01-15 LAB — VITAMIN D 25 HYDROXY (VIT D DEFICIENCY, FRACTURES): Vit D, 25-Hydroxy: 26.2 ng/mL — ABNORMAL LOW (ref 30.0–100.0)

## 2024-01-15 LAB — VITAMIN B12: Vitamin B-12: 342 pg/mL (ref 232–1245)

## 2024-01-15 NOTE — Progress Notes (Signed)
 Hi Linda Odom,  Your vitamin D  is in the slightly insufficient category at 26.2.  I would recommend restarting your vitamin D  50,000 unit capsules once a week in addition to taking calcium 600 to 800 units daily (can be present in a multivitamin).  Your B12 was on the lower range of normal.  I would recommend starting a B12 oral supplement of 1000 mcg daily to help improve energy and fatigue.  Your iron levels were normal.  We can recheck these levels at your appointment in August as well.

## 2024-01-26 ENCOUNTER — Ambulatory Visit: Attending: Family Medicine | Admitting: Physical Therapy

## 2024-01-26 ENCOUNTER — Encounter: Payer: Self-pay | Admitting: Physical Therapy

## 2024-01-26 ENCOUNTER — Other Ambulatory Visit: Payer: Self-pay

## 2024-01-26 DIAGNOSIS — M545 Low back pain, unspecified: Secondary | ICD-10-CM | POA: Insufficient documentation

## 2024-01-26 DIAGNOSIS — G8929 Other chronic pain: Secondary | ICD-10-CM | POA: Diagnosis not present

## 2024-01-26 DIAGNOSIS — M5459 Other low back pain: Secondary | ICD-10-CM | POA: Diagnosis present

## 2024-01-26 DIAGNOSIS — M6281 Muscle weakness (generalized): Secondary | ICD-10-CM | POA: Diagnosis present

## 2024-01-26 DIAGNOSIS — M7989 Other specified soft tissue disorders: Secondary | ICD-10-CM | POA: Insufficient documentation

## 2024-01-26 NOTE — Therapy (Signed)
 OUTPATIENT PHYSICAL THERAPY THORACOLUMBAR EVALUATION   Patient Name: Linda Odom MRN: [id number] DOB:Jun 11, 1981, 43 y.o., female Today's Date: 01/26/2024  END OF SESSION:  PT End of Session - 01/26/24 1636     Visit Number 1    Number of Visits 13    Date for PT Re-Evaluation 03/08/24    PT Start Time 1532    PT Stop Time 1610    PT Time Calculation (min) 38 min    Activity Tolerance Patient tolerated treatment well    Behavior During Therapy [facility] for tasks assessed/performed             Past Medical History:  Diagnosis Date   Acute appendicitis 11/13/2013   Acute appendicitis, unspecified acute appendicitis type 11/13/2013   Anxiety    Arthritis    Bipolar 1 disorder (HCC)    Breast nodule    Depression    Gastric ulcer    No pertinent past medical history    No-show for appointment-healthy weight and wellness 10/13/2023   Osteoarthritis    Vitamin D  deficiency    Past Surgical History:  Procedure Laterality Date   CHOLECYSTECTOMY     LAPAROSCOPIC APPENDECTOMY N/A 11/13/2013   Procedure: LAP ASSISTED PARTIAL CECECTOMY;  Surgeon: Harlee Lichtenstein, MD;  Location: [facility];  Service: General;  Laterality: N/A;   leg sugery  1994   TUBAL LIGATION  2006   Patient Active Problem List   Diagnosis Date Noted   Bilateral lower extremity edema 04/16/2023   Thyromegaly 04/09/2023   Class 3 severe obesity due to excess calories with serious comorbidity and body mass index (BMI) of 40.0 to 44.9 in adult 03/26/2023   Vitamin D  deficiency 03/26/2023   Chronic low back pain without sciatica 03/26/2023   Elevated cholesterol 12/31/2021    PCP: Nonda Bays, FNP  REFERRING PROVIDER: Nonda Bays, FNP  REFERRING DIAG: M54.50,G89.29 (ICD-10-CM) - Chronic low back pain without sciatica, unspecified back pain laterality   Rationale for Evaluation and Treatment: Rehabilitation  THERAPY DIAG:  Other low back pain - Plan: PT plan of care  cert/re-cert  Muscle weakness (generalized) - Plan: PT plan of care cert/re-cert  Other specified soft tissue disorders - Plan: PT plan of care cert/re-cert  PERTINENT HISTORY: Thyromegaly,class 3 obesity.  WEIGHT BEARING RESTRICTIONS: No  FALLS:  Has patient fallen in last 6 months? 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  Has been medicinally managing with muscle relaxer prescribed from PCP. Feels like there is a lot of mental fatigue associated with her pain.  Has more pain when sitting.  N/T starts ups with an increase in pain, especially while sitting. Gets up to  7/10  RED FLAGS: None    PLOF: Independent  PATIENT GOALS: fix the pain  NEXT MD VISIT: need to schedule ---------------------------------------------------------------------------------------------  OBJECTIVE:  Note: Objective measures were completed at Evaluation unless otherwise noted.  DIAGNOSTIC FINDINGS:  04/25/2022 X-rays demonstrate mild degenerative changes L5-S1    PATIENT SURVEYS:  Modified Oswestry 31/50 (62%)   COGNITION: Overall cognitive status: Within functional limits for tasks assessed   PALPATION: Tenderness to R piriformis  Lumbar contraction pattern  L Multifidus:low quality contraction  R Multifidus: low quality contraction   SENSATION: WFL  MUSCLE  LENGTH: Thomas test: B limited 1&2 joint hip flexor  POSTURE: increased lumbar lordosis   LUMBAR ROM:   AROM eval  Flexion 90%  Extension 70%  Right lateral flexion   Left lateral flexion   Right rotation   Left rotation    (Blank rows = not tested)  ! Indicates pain with testing  LOWER EXTREMITY ROM:     Active  Right eval Left eval  Hip flexion    Hip extension    Hip abduction    Hip adduction    Hip internal rotation    Hip external rotation    Knee flexion    Knee extension    Ankle dorsiflexion    Ankle plantarflexion    Ankle inversion    Ankle eversion     (Blank rows = not tested)  ! Indicates pain with testing  LOWER EXTREMITY MMT:    MMT Right eval Left eval  Hip flexion 4 4  Hip extension 4 4  Hip abduction 4 4  Hip adduction    Hip internal rotation    Hip external rotation    Knee flexion 4 4-  Knee extension 4+ 4+  Ankle dorsiflexion    Ankle plantarflexion    Ankle inversion    Ankle eversion     (Blank rows = not tested)   ! Indicates pain with testing LUMBAR SPECIAL TESTS:  Prone instability test: Positive and Slump test: Positive  GAIT: Distance walked: 170ft Assistive device utilized: None Level of assistance: Complete Independence Comments: [facility]  [facility] Adult PT Treatment:                                                DATE: 01/26/2024  Self Care: Pt education\ POC discussion  PATIENT EDUCATION:  Education details: Pt received education regarding HEP performance, ADL performance, functional activity tolerance, impairment education, appropriate performance of therapeutic activities.  Person educated: Patient Education method: Explanation, Demonstration, Tactile cues, Verbal cues, and Handouts Education comprehension: verbalized understanding and returned demonstration  HOME EXERCISE  PROGRAM: Access Code: [email or web address] URL: [email or web address] Date: 01/26/2024 Prepared by: Albesa Huguenin  Exercises - Standing Hip Flexor Stretch  - 1-2 x daily - 5 x weekly - 2 sets - 1 reps - 57m hold - Supine Piriformis Stretch with Foot on Ground  - 1 x daily - 7 x weekly - 2 sets - 1 reps - 45m hold - Standing 3-Way Leg Reach with Resistance at Ankles and Counter Support  - 1 x daily - 7 x weekly - 3 sets - 10 reps - Supine Bridge with Resistance Band  - 1 x daily - 7 x weekly - 3 sets - 10 reps ---------------------------------------------------------------------------------------------  ASSESSMENT:  CLINICAL IMPRESSION: Eval impression (01/26/2024): Pt. attended today's physical therapy session for evaluation of low back pain. Pt has complaints of chronic LBP that worsens with sitting. Pt has notable deficits with global BLE strength, lumbar stability, hip flexor motility, and activity tolerance. Pt would benefit from therapeutic focus on lumbar stability, B hip flexor motility, and global BLE strengthening. Treatment performed today focused on pt education detailed in obj. Pt demonstrated great understanding of education provided. required minimal verbal/tactile cues and no physical assistance for appropriate performance with today's activities. Pt requires the intervention of skilled outpatient physical therapy to address the aforementioned deficits and progress towards a functional level in line with therapeutic goals.    OBJECTIVE IMPAIRMENTS: decreased activity tolerance, decreased knowledge of condition, decreased mobility, difficulty walking, decreased ROM, decreased strength, impaired flexibility, postural dysfunction, and pain.   ACTIVITY LIMITATIONS: bending, sitting, squatting, stairs, and transfers  PARTICIPATION LIMITATIONS: interpersonal relationship, driving, community activity, and occupation  PERSONAL FACTORS: Age, Behavior pattern, Fitness, Past/current  experiences, and Time since onset of injury/illness/exacerbation are also affecting patient's functional outcome.   REHAB POTENTIAL: Good  CLINICAL DECISION MAKING: Stable/uncomplicated  EVALUATION COMPLEXITY: Low   GOALS: Goals reviewed with patient? YES  SHORT TERM GOALS: Target date: 02/23/2024  Pt will be independent with administered HEP to demonstrate the competency necessary for long term managemnet of symptoms at home. Baseline: Goal status: INITIAL  LONG TERM GOALS: Target date: 03/08/2024  Pt. Will achieve a MODI score of 25/50(50%) as to demonstrate improvement in self-perceived functional ability with daily activities.  Baseline: 31/50 (62%) Goal status: INITIAL  2.  Pt will improve Global hip strength to a 4+/5 to demonstrate improvement in strength for quality of motion and activity performance.  Baseline:  Goal status: INITIAL  3.  Pt will improve Lumbar AROM to 90% of standardized norms with less than 2/10 pain to demonstrate necessary mobility for high quality and safe ADLs  Baseline:  Goal status: INITIAL  4.  Pt will report pain levels improving during ADLs to be less than or equal to 2/10 as to demonstrate improved tolerance with daily functional activities such as sitting during occupation, cleaning, and dressing.  Baseline: 7/10 Goal status: INITIAL ---------------------------------------------------------------------------------------------  PLAN:  PT FREQUENCY: 1-2x/week  PT DURATION: 6 weeks  PLANNED INTERVENTIONS: 97110-Therapeutic exercises, 97530- Therapeutic activity, 97112- Neuromuscular re-education, 97535- Self Care, [id number]- Manual therapy, [phone]- Gait training, [phone]- Aquatic Therapy, Balance training, Stair training, Taping, Dry Needling, Joint mobilization, and Spinal mobilization.  PLAN FOR NEXT SESSION: Review HEP, Begin POC as detailed in  assessment   Albesa Huguenin, PT, DPT 01/26/2024, 4:38 PM

## 2024-02-09 ENCOUNTER — Ambulatory Visit: Attending: Family Medicine | Admitting: Physical Therapy

## 2024-02-09 NOTE — Therapy (Incomplete)
 OUTPATIENT PHYSICAL THERAPY THORACOLUMBAR EVALUATION   Patient Name: Linda Odom MRN: [id number] DOB:[birth year], 43 y.o., female Today's Date: 02/09/2024  END OF SESSION:    Past Medical History:  Diagnosis Date   Acute appendicitis 11/13/2013   Acute appendicitis, unspecified acute appendicitis type 11/13/2013   Anxiety    Arthritis    Bipolar 1 disorder (HCC)    Breast nodule    Depression    Gastric ulcer    No pertinent past medical history    No-show for appointment-healthy weight and wellness 10/13/2023   Osteoarthritis    Vitamin D  deficiency    Past Surgical History:  Procedure Laterality Date   CHOLECYSTECTOMY     LAPAROSCOPIC APPENDECTOMY N/A 11/13/2013   Procedure: LAP ASSISTED PARTIAL CECECTOMY;  Surgeon: Harlee Lichtenstein, MD;  Location: [facility];  Service: General;  Laterality: N/A;   leg sugery  1994   TUBAL LIGATION  2006   Patient Active Problem List   Diagnosis Date Noted   Bilateral lower extremity edema 04/16/2023   Thyromegaly 04/09/2023   Class 3 severe obesity due to excess calories with serious comorbidity and body mass index (BMI) of 40.0 to 44.9 in adult 03/26/2023   Vitamin D  deficiency 03/26/2023   Chronic low back pain without sciatica 03/26/2023   Elevated cholesterol 12/31/2021    PCP: Nonda Bays, FNP  REFERRING PROVIDER: Nonda Bays, FNP  REFERRING DIAG: M54.50,G89.29 (ICD-10-CM) - Chronic low back pain without sciatica, unspecified back pain laterality   Rationale for Evaluation and Treatment: Rehabilitation  THERAPY DIAG:  No diagnosis found.  PERTINENT HISTORY: Thyromegaly,class 3 obesity.  WEIGHT BEARING RESTRICTIONS: No  FALLS:  Has patient fallen in last 6 months? 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  Has been medicinally managing with muscle relaxer prescribed from PCP. Feels like there is a lot of mental fatigue associated with her pain.  Has more pain when sitting.  N/T starts ups with an increase in pain, especially while sitting. Gets up to  7/10  RED FLAGS: None    PLOF: Independent  PATIENT GOALS: fix  the pain  NEXT MD VISIT: need to schedule ---------------------------------------------------------------------------------------------  OBJECTIVE:  Note: Objective measures were completed at Evaluation unless otherwise noted.  DIAGNOSTIC FINDINGS:  04/25/2022 X-rays demonstrate mild degenerative changes L5-S1    PATIENT SURVEYS:  Modified Oswestry 31/50 (62%)   COGNITION: Overall cognitive status: Within functional limits for tasks assessed   PALPATION: Tenderness to R piriformis  Lumbar contraction pattern  L Multifidus:low quality contraction  R Multifidus: low quality contraction   SENSATION: WFL  MUSCLE LENGTH: Thomas test: B limited 1&2 joint hip flexor  POSTURE: increased lumbar lordosis   LUMBAR ROM:   AROM eval  Flexion 90%  Extension 70%  Right lateral flexion   Left lateral flexion   Right rotation   Left rotation    (Blank rows = not tested)  ! Indicates pain with testing  LOWER EXTREMITY ROM:     Active  Right eval Left eval  Hip flexion    Hip extension    Hip abduction    Hip adduction    Hip internal rotation    Hip external rotation    Knee flexion    Knee extension    Ankle dorsiflexion     Ankle plantarflexion    Ankle inversion    Ankle eversion     (Blank rows = not tested)  ! Indicates pain with testing  LOWER EXTREMITY MMT:    MMT Right eval Left eval  Hip flexion 4 4  Hip extension 4 4  Hip abduction 4 4  Hip adduction    Hip internal rotation    Hip external rotation    Knee flexion 4 4-  Knee extension 4+ 4+  Ankle dorsiflexion    Ankle plantarflexion    Ankle inversion    Ankle eversion     (Blank rows = not tested)   ! Indicates pain with testing LUMBAR SPECIAL TESTS:  Prone instability test: Positive and Slump test: Positive  GAIT: Distance walked: 110ft Assistive device utilized: None Level of assistance: Complete Independence Comments: WFL  [facility] Adult PT Treatment:                                                DATE: 02/09/2024  Self Care: Pt education\ POC discussion                                                                                                                                PATIENT EDUCATION:  Education details: Pt received education regarding HEP performance, ADL performance, functional activity tolerance, impairment education, appropriate performance of therapeutic activities.  Person educated: Patient Education method: Explanation, Demonstration, Tactile cues, Verbal cues, and Handouts Education comprehension: verbalized understanding and returned demonstration  HOME EXERCISE PROGRAM: Access Code: [email or web address] URL: [email or web address] Date: 01/26/2024 Prepared by: Albesa Huguenin  Exercises - Standing  Hip Flexor Stretch  - 1-2 x daily - 5 x weekly - 2 sets - 1 reps - 6m hold - Supine Piriformis Stretch with Foot on Ground  - 1 x daily - 7 x weekly - 2 sets - 1 reps - 64m hold - Standing 3-Way Leg Reach with Resistance at Ankles and Counter Support  - 1 x daily - 7 x weekly - 3 sets - 10 reps - Supine Bridge with Resistance Band  - 1 x daily - 7 x weekly - 3 sets - 10  reps ---------------------------------------------------------------------------------------------  ASSESSMENT:  CLINICAL IMPRESSION: Eval impression (01/26/2024): Pt. attended today's physical therapy session for evaluation of low back pain. Pt has complaints of chronic LBP that worsens with sitting. Pt has notable deficits with global BLE strength, lumbar stability, hip flexor motility, and activity tolerance. Pt would benefit from therapeutic focus on lumbar stability, B hip flexor motility, and global BLE strengthening. Treatment performed today focused on pt education detailed in obj. Pt demonstrated great understanding of education provided. required minimal verbal/tactile cues and no physical assistance for appropriate performance with today's activities. Pt requires the intervention of skilled outpatient physical therapy to address the aforementioned deficits and progress towards a functional level in line with therapeutic goals.    OBJECTIVE IMPAIRMENTS: decreased activity tolerance, decreased knowledge of condition, decreased mobility, difficulty walking, decreased ROM, decreased strength, impaired flexibility, postural dysfunction, and pain.   ACTIVITY LIMITATIONS: bending, sitting, squatting, stairs, and transfers  PARTICIPATION LIMITATIONS: interpersonal relationship, driving, community activity, and occupation  PERSONAL FACTORS: Age, Behavior pattern, Fitness, Past/current experiences, and Time since onset of injury/illness/exacerbation are also affecting patient's functional outcome.   REHAB POTENTIAL: Good  CLINICAL DECISION MAKING: Stable/uncomplicated  EVALUATION COMPLEXITY: Low   GOALS: Goals reviewed with patient? YES  SHORT TERM GOALS: Target date: 02/23/2024  Pt will be independent with administered HEP to demonstrate the competency necessary for long term managemnet of symptoms at home. Baseline: Goal status: INITIAL  LONG TERM GOALS: Target date:  03/08/2024  Pt. Will achieve a MODI score of 25/50(50%) as to demonstrate improvement in self-perceived functional ability with daily activities.  Baseline: 31/50 (62%) Goal status: INITIAL  2.  Pt will improve Global hip strength to a 4+/5 to demonstrate improvement in strength for quality of motion and activity performance.  Baseline:  Goal status: INITIAL  3.  Pt will improve Lumbar AROM to 90% of standardized norms with less than 2/10 pain to demonstrate necessary mobility for high quality and safe ADLs  Baseline:  Goal status: INITIAL  4.  Pt will report pain levels improving during ADLs to be less than or equal to 2/10 as to demonstrate improved tolerance with daily functional activities such as sitting during occupation, cleaning, and dressing.  Baseline: 7/10 Goal status: INITIAL ---------------------------------------------------------------------------------------------  PLAN:  PT FREQUENCY: 1-2x/week  PT DURATION: 6 weeks  PLANNED INTERVENTIONS: 97110-Therapeutic exercises, 97530- Therapeutic activity, 97112- Neuromuscular re-education, 97535- Self Care, [id number]- Manual therapy, [phone]- Gait training, [phone]- Aquatic Therapy, Balance training, Stair training, Taping, Dry Needling, Joint mobilization, and Spinal mobilization.  PLAN FOR NEXT SESSION: Review HEP, Begin POC as detailed in assessment   Albesa Huguenin, PT, DPT 02/09/2024, 1:12 PM

## 2024-02-11 ENCOUNTER — Ambulatory Visit: Admitting: Physical Therapy

## 2024-02-16 ENCOUNTER — Ambulatory Visit: Admitting: Physical Therapy

## 2024-02-16 NOTE — Therapy (Incomplete)
 OUTPATIENT PHYSICAL THERAPY THORACOLUMBAR EVALUATION   Patient Name: Linda Odom MRN: [id number] DOB:Jul 05, 1981, 43 y.o., female Today's Date: 02/16/2024  END OF SESSION:    Past Medical History:  Diagnosis Date   Acute appendicitis 11/13/2013   Acute appendicitis, unspecified acute appendicitis type 11/13/2013   Anxiety    Arthritis    Bipolar 1 disorder (HCC)    Breast nodule    Depression    Gastric ulcer    No pertinent past medical history    No-show for appointment-healthy weight and wellness 10/13/2023   Osteoarthritis    Vitamin D  deficiency    Past Surgical History:  Procedure Laterality Date   CHOLECYSTECTOMY     LAPAROSCOPIC APPENDECTOMY N/A 11/13/2013   Procedure: LAP ASSISTED PARTIAL CECECTOMY;  Surgeon: Harlee Lichtenstein, MD;  Location: [facility];  Service: General;  Laterality: N/A;   leg sugery  1994   TUBAL LIGATION  2006   Patient Active Problem List   Diagnosis Date Noted   Bilateral lower extremity edema 04/16/2023   Thyromegaly 04/09/2023   Class 3 severe obesity due to excess calories with serious comorbidity and body mass index (BMI) of 40.0 to 44.9 in adult 03/26/2023   Vitamin D  deficiency 03/26/2023   Chronic low back pain without sciatica 03/26/2023   Elevated cholesterol 12/31/2021    PCP: Nonda Bays, FNP  REFERRING PROVIDER: Nonda Bays, FNP  REFERRING DIAG: M54.50,G89.29 (ICD-10-CM) - Chronic low back pain without sciatica, unspecified back pain laterality   Rationale for Evaluation and Treatment: Rehabilitation  THERAPY DIAG:  No diagnosis found.  PERTINENT HISTORY: Thyromegaly,class 3 obesity.  WEIGHT BEARING RESTRICTIONS: No  FALLS:  Has patient fallen in last 6 months? 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  Has been medicinally managing with muscle relaxer prescribed from PCP. Feels like there is a lot of mental fatigue associated with her pain.  Has more pain when sitting.  N/T starts ups with an increase in pain, especially while sitting. Gets up to  7/10  RED FLAGS: None    PLOF: Independent  PATIENT GOALS: fix  the pain  NEXT MD VISIT: need to schedule ---------------------------------------------------------------------------------------------  OBJECTIVE:  Note: Objective measures were completed at Evaluation unless otherwise noted.  DIAGNOSTIC FINDINGS:  04/25/2022 X-rays demonstrate mild degenerative changes L5-S1    PATIENT SURVEYS:  Modified Oswestry 31/50 (62%)   COGNITION: Overall cognitive status: Within functional limits for tasks assessed   PALPATION: Tenderness to R piriformis  Lumbar contraction pattern  L Multifidus:low quality contraction  R Multifidus: low quality contraction   SENSATION: WFL  MUSCLE LENGTH: Thomas test: B limited 1&2 joint hip flexor  POSTURE: increased lumbar lordosis   LUMBAR ROM:   AROM eval  Flexion 90%  Extension 70%  Right lateral flexion   Left lateral flexion   Right rotation   Left rotation    (Blank rows = not tested)  ! Indicates pain with testing  LOWER EXTREMITY ROM:     Active  Right eval Left eval  Hip flexion    Hip extension    Hip abduction    Hip adduction    Hip internal rotation    Hip external rotation    Knee flexion    Knee extension    Ankle dorsiflexion     Ankle plantarflexion    Ankle inversion    Ankle eversion     (Blank rows = not tested)  ! Indicates pain with testing  LOWER EXTREMITY MMT:    MMT Right eval Left eval  Hip flexion 4 4  Hip extension 4 4  Hip abduction 4 4  Hip adduction    Hip internal rotation    Hip external rotation    Knee flexion 4 4-  Knee extension 4+ 4+  Ankle dorsiflexion    Ankle plantarflexion    Ankle inversion    Ankle eversion     (Blank rows = not tested)   ! Indicates pain with testing LUMBAR SPECIAL TESTS:  Prone instability test: Positive and Slump test: Positive  GAIT: Distance walked: 1104ft Assistive device utilized: None Level of assistance: Complete Independence Comments: WFL  [facility] Adult PT Treatment:                                                DATE: 02/16/2024  Self Care: Pt education\ POC discussion                                                                                                                                PATIENT EDUCATION:  Education details: Pt received education regarding HEP performance, ADL performance, functional activity tolerance, impairment education, appropriate performance of therapeutic activities.  Person educated: Patient Education method: Explanation, Demonstration, Tactile cues, Verbal cues, and Handouts Education comprehension: verbalized understanding and returned demonstration  HOME EXERCISE PROGRAM: Access Code: [email or web address] URL: [email or web address] Date: 01/26/2024 Prepared by: Albesa Huguenin  Exercises - Standing  Hip Flexor Stretch  - 1-2 x daily - 5 x weekly - 2 sets - 1 reps - 90m hold - Supine Piriformis Stretch with Foot on Ground  - 1 x daily - 7 x weekly - 2 sets - 1 reps - 85m hold - Standing 3-Way Leg Reach with Resistance at Ankles and Counter Support  - 1 x daily - 7 x weekly - 3 sets - 10 reps - Supine Bridge with Resistance Band  - 1 x daily - 7 x weekly - 3 sets - 10  reps ---------------------------------------------------------------------------------------------  ASSESSMENT:  CLINICAL IMPRESSION:   Eval impression (01/26/2024): Pt. attended today's physical therapy session for evaluation of low back pain. Pt has complaints of chronic LBP that worsens with sitting. Pt has notable deficits with global BLE strength, lumbar stability, hip flexor motility, and activity tolerance. Pt would benefit from therapeutic focus on lumbar stability, B hip flexor motility, and global BLE strengthening. Treatment performed today focused on pt education detailed in obj. Pt demonstrated great understanding of education provided. required minimal verbal/tactile cues and no physical assistance for appropriate performance with today's activities. Pt requires the intervention of skilled outpatient physical therapy to address the aforementioned deficits and progress towards a functional level in line with therapeutic goals.    OBJECTIVE IMPAIRMENTS: decreased activity tolerance, decreased knowledge of condition, decreased mobility, difficulty walking, decreased ROM, decreased strength, impaired flexibility, postural dysfunction, and pain.   ACTIVITY LIMITATIONS: bending, sitting, squatting, stairs, and transfers  PARTICIPATION LIMITATIONS: interpersonal relationship, driving, community activity, and occupation  PERSONAL FACTORS: Age, Behavior pattern, Fitness, Past/current experiences, and Time since onset of injury/illness/exacerbation are also affecting patient's functional outcome.   REHAB POTENTIAL: Good  CLINICAL DECISION MAKING: Stable/uncomplicated  EVALUATION COMPLEXITY: Low   GOALS: Goals reviewed with patient? YES  SHORT TERM GOALS: Target date: 02/23/2024  Pt will be independent with administered HEP to demonstrate the competency necessary for long term managemnet of symptoms at home. Baseline: Goal status: INITIAL  LONG TERM GOALS: Target date:  03/08/2024  Pt. Will achieve a MODI score of 25/50(50%) as to demonstrate improvement in self-perceived functional ability with daily activities.  Baseline: 31/50 (62%) Goal status: INITIAL  2.  Pt will improve Global hip strength to a 4+/5 to demonstrate improvement in strength for quality of motion and activity performance.  Baseline:  Goal status: INITIAL  3.  Pt will improve Lumbar AROM to 90% of standardized norms with less than 2/10 pain to demonstrate necessary mobility for high quality and safe ADLs  Baseline:  Goal status: INITIAL  4.  Pt will report pain levels improving during ADLs to be less than or equal to 2/10 as to demonstrate improved tolerance with daily functional activities such as sitting during occupation, cleaning, and dressing.  Baseline: 7/10 Goal status: INITIAL ---------------------------------------------------------------------------------------------  PLAN:  PT FREQUENCY: 1-2x/week  PT DURATION: 6 weeks  PLANNED INTERVENTIONS: 97110-Therapeutic exercises, 97530- Therapeutic activity, 97112- Neuromuscular re-education, 97535- Self Care, 82956- Manual therapy, [phone]- Gait training, [phone]- Aquatic Therapy, Balance training, Stair training, Taping, Dry Needling, Joint mobilization, and Spinal mobilization.  PLAN FOR NEXT SESSION: Review HEP, Begin POC as detailed in assessment   Albesa Huguenin, PT, DPT 02/16/2024, 2:45 PM

## 2024-02-18 ENCOUNTER — Ambulatory Visit: Admitting: Physical Therapy

## 2024-02-23 ENCOUNTER — Encounter: Admitting: Physical Therapy

## 2024-02-25 ENCOUNTER — Encounter: Admitting: Physical Therapy

## 2024-04-09 ENCOUNTER — Encounter (HOSPITAL_BASED_OUTPATIENT_CLINIC_OR_DEPARTMENT_OTHER): Payer: Medicaid Other | Admitting: Family Medicine

## 2024-05-09 ENCOUNTER — Other Ambulatory Visit (HOSPITAL_BASED_OUTPATIENT_CLINIC_OR_DEPARTMENT_OTHER): Payer: Self-pay | Admitting: Family Medicine

## 2024-05-09 DIAGNOSIS — E559 Vitamin D deficiency, unspecified: Secondary | ICD-10-CM

## 2024-06-26 ENCOUNTER — Other Ambulatory Visit (HOSPITAL_BASED_OUTPATIENT_CLINIC_OR_DEPARTMENT_OTHER): Payer: Self-pay | Admitting: Family Medicine

## 2024-07-02 ENCOUNTER — Ambulatory Visit (HOSPITAL_BASED_OUTPATIENT_CLINIC_OR_DEPARTMENT_OTHER): Admitting: Family Medicine

## 2024-08-12 ENCOUNTER — Other Ambulatory Visit (HOSPITAL_BASED_OUTPATIENT_CLINIC_OR_DEPARTMENT_OTHER): Payer: Self-pay | Admitting: Family Medicine

## 2024-08-12 DIAGNOSIS — M545 Low back pain, unspecified: Secondary | ICD-10-CM

## 2024-08-31 ENCOUNTER — Other Ambulatory Visit (HOSPITAL_BASED_OUTPATIENT_CLINIC_OR_DEPARTMENT_OTHER): Payer: Self-pay | Admitting: Family Medicine

## 2024-08-31 DIAGNOSIS — E559 Vitamin D deficiency, unspecified: Secondary | ICD-10-CM

## 2024-08-31 NOTE — Telephone Encounter (Signed)
 Copied from CRM #[id number]. Topic: Clinical - Medication Refill >> Aug 31, 2024  3:26 PM Carla L wrote: Medication: Vitamin D , Ergocalciferol , (DRISDOL ) 1.25 MG (50000 UNIT) CAPS capsule  Has the patient contacted their pharmacy? Yes Told to contact office for further refills.   This is the patient's preferred pharmacy: [facility] [address], [place] - [address] [place] [address] Phone: [phone] Fax: [phone]  Is this the correct pharmacy for this prescription? Yes  Has the prescription been filled recently? No  Is the patient out of the medication? Yes  Has the patient been seen for an appointment in the last year OR does the patient have an upcoming appointment? Yes  Can we respond through MyChart? No  Agent: Please be advised that Rx refills may take up to 3 business days. We ask that you follow-up with your pharmacy.

## 2024-10-08 ENCOUNTER — Ambulatory Visit: Payer: Self-pay

## 2024-10-08 ENCOUNTER — Ambulatory Visit: Admitting: Family Medicine

## 2024-10-08 VITALS — BP 118/80 | Temp 98.1°F | Ht 64.0 in | Wt 247.6 lb

## 2024-10-08 DIAGNOSIS — W009XXA Unspecified fall due to ice and snow, initial encounter: Secondary | ICD-10-CM

## 2024-10-08 DIAGNOSIS — M62830 Muscle spasm of back: Secondary | ICD-10-CM

## 2024-10-08 DIAGNOSIS — M545 Low back pain, unspecified: Secondary | ICD-10-CM

## 2024-10-08 MED ORDER — TIZANIDINE HCL 4 MG PO TABS: 4.0000 mg | ORAL_TABLET | Freq: Four times a day (QID) | ORAL | 0 refills | Status: AC | PRN

## 2024-10-08 NOTE — Progress Notes (Signed)
 "  Acute Visit   Subjective  Patient ID: Linda Odom, female    DOB: July 21, 1981  Age: 44 y.o. MRN: [id number]  Chief Complaint  Patient presents with   Acute Visit    Patient came in today for  a fall the happen 2 days ago, patient has been having right knee pain and right lower back pain, rate of pain 8 out of 10,   Pt's school aged granddaughters present.  Pt is a 44 yo female followed by Thersia Stark, FNP who is seen for concern.  Pt endorses slipping on ice 2 days ago.  Landed on R knee.  Endorses R sided low back pain,  8/10.  No loss of bowel or bladder, weakness in bilateral LE extremities, pain in posterior LEs, paresthesias.  Knee pain improving, area of R lateral tenderness.  Tried Biofreeze, Ace bandage.  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    Patient Active Problem List   Diagnosis Date Noted   Bilateral lower extremity edema 04/16/2023   Thyromegaly 04/09/2023   Class 3 severe obesity due to excess calories with serious comorbidity and body mass index (BMI) of 40.0 to 44.9 in adult (HCC) 03/26/2023   Vitamin D  deficiency 03/26/2023   Chronic low back pain without sciatica 03/26/2023   Elevated cholesterol 12/31/2021   Past Medical History:  Diagnosis Date   Acute appendicitis 11/13/2013   Acute appendicitis, unspecified acute appendicitis type 11/13/2013   Anxiety    Arthritis    Bipolar 1 disorder (HCC)    Breast nodule    Depression    Gastric ulcer    No pertinent past medical history    No-show for appointment-healthy weight and wellness 10/13/2023   Osteoarthritis    Vitamin D  deficiency    Past Surgical History:  Procedure Laterality Date   CHOLECYSTECTOMY     LAPAROSCOPIC APPENDECTOMY N/A 11/13/2013   Procedure: LAP ASSISTED PARTIAL CECECTOMY;  Surgeon: Krystal JINNY Russell, MD;  Location: [facility];  Service: General;  Laterality: N/A;   leg sugery  1994   TUBAL LIGATION  2006   Social History[1] Family History  Problem  Relation Age of Onset   Hypertension Mother    Diabetes Mother    High Cholesterol Mother    Sudden death Mother    Stroke Mother    Depression Mother    Anxiety disorder Mother    Hypertension Father    Asthma Daughter    Asthma Son    Hypertension Maternal Grandmother    Other Neg Hx    Allergies[2]  ROS Negative unless stated above    Objective:     BP 118/80 (BP Location: Left Arm, Patient Position: Sitting, Cuff Size: Large)   Temp 98.1 F (36.7 C) (Oral)   Ht 5' 4 (1.626 m)   Wt 247 lb 9.6 oz (112.3 kg)   BMI 42.50 kg/m  BP Readings from Last 3 Encounters:  10/08/24 118/80  01/14/24 114/72  12/13/23 110/70   Wt Readings from Last 3 Encounters:  10/08/24 247 lb 9.6 oz (112.3 kg)  01/14/24 248 lb (112.5 kg)  12/16/23 245 lb 9.6 oz (111.4 kg)      Physical Exam Constitutional:      General: She is not in acute distress.    Appearance: Normal appearance.  HENT:     Head: Normocephalic and atraumatic.     Nose: Nose normal.     Mouth/Throat:     Mouth: Mucous membranes are  moist.  Cardiovascular:     Rate and Rhythm: Normal rate and regular rhythm.     Heart sounds: Normal heart sounds. No murmur heard.    No gallop.  Pulmonary:     Effort: Pulmonary effort is normal. No respiratory distress.     Breath sounds: Normal breath sounds. No wheezing, rhonchi or rales.  Musculoskeletal:     Comments: No TTP of cervical, thoracic, or lumbar spin, or paraspinal muscles.  Moves from exam table to standing without difficulty.  Flexion, extension, and lateral movements of spine without difficulty.  Skin:    General: Skin is warm and dry.  Neurological:     Mental Status: She is alert and oriented to person, place, and time.     Motor: No weakness.     Gait: Gait normal.        01/14/2024    1:08 PM 04/09/2023   10:44 AM 03/26/2023   10:42 AM  Depression screen PHQ 2/9  Decreased Interest 3 2 2   Down, Depressed, Hopeless 1 2 2   PHQ - 2 Score 4 4 4    Altered sleeping 2 2 1   Tired, decreased energy 1 2 1   Change in appetite 2 2 2   Feeling bad or failure about yourself  1 1 0  Trouble concentrating 1 1 0  Moving slowly or fidgety/restless 0 2 0  Suicidal thoughts 0 0 0  PHQ-9 Score 11  14  8    Difficult doing work/chores Somewhat difficult Somewhat difficult Somewhat difficult     Data saved with a previous flowsheet row definition      01/14/2024    1:09 PM 04/09/2023   10:44 AM 03/26/2023   10:42 AM  GAD 7 : Generalized Anxiety Score  Nervous, Anxious, on Edge 0  1  2   Control/stop worrying 3  2  2    Worry too much - different things 3  2  2    Trouble relaxing 3  2  2    Restless 0  2  1   Easily annoyed or irritable 0  1  2   Afraid - awful might happen 0  1  2   Total GAD 7 Score 9 11 13   Anxiety Difficulty Somewhat difficult Somewhat difficult Somewhat difficult     Data saved with a previous flowsheet row definition     No results found for any visits on 10/08/24.    Assessment & Plan:   Acute right-sided low back pain without sciatica -     tiZANidine  HCl; Take 1 tablet (4 mg total) by mouth every 6 (six) hours as needed for muscle spasms.  Dispense: 20 tablet; Refill: 0  Back muscle spasm -     tiZANidine  HCl; Take 1 tablet (4 mg total) by mouth every 6 (six) hours as needed for muscle spasms.  Dispense: 20 tablet; Refill: 0  Fall due to slipping on ice or snow, initial encounter  Acute right-sided low back pain and knee pain status post fall on ice.  Advised on likely duration of symptoms, 4-6 weeks.  Tizanidine  as needed for muscle spasms.  Given precautions as may cause drowsiness.  Continue supportive care with ice, heat, topical analgesics, stretching, massage, Tylenol /NSAIDs as needed.  Follow-up with PCP for continued or worsening symptoms.  Patient advised to follow-up with PCP for several ongoing concerns that were not addressed due to time and this being an acute visit.  I personally spent a total of  35 minutes in the  care of the patient today including preparing to see the patient, getting/reviewing separately obtained history, performing a medically appropriate exam/evaluation, counseling and educating, placing orders, documenting clinical information in the EHR, and communicating results.   Return with pcp if symptoms worsen or fail to improve.   Clotilda JONELLE Single, MD     [1]  Social History Tobacco Use   Smoking status: Former    Current packs/day: 0.00    Types: Cigarettes    Quit date: 1996    Years since quitting: 30.1   Smokeless tobacco: Never   Tobacco comments:    Currently smoking 2-3 cigs about 2-3 days a week as of 03/26/23 ep  Substance Use Topics   Alcohol use: Yes    Comment: occ   Drug use: Yes    Types: Marijuana  [2] No Known Allergies  "

## 2024-10-08 NOTE — Telephone Encounter (Signed)
 FYI Only or Action Required?: FYI only for provider: appointment scheduled on 1/30.  Patient was last seen in primary care on 01/14/2024 by Knute Thersia Bitters, FNP.  Called Nurse Triage reporting Fall.  Symptoms began yesterday.  Interventions attempted: Prescription medications: gabapentin   and Rest, hydration, or home remedies.  Symptoms are: gradually worsening.  Triage Disposition: See PCP When Office is Open (Within 3 Days)  Patient/caregiver understands and will follow disposition?: Yes Message from Tiffini S sent at 10/08/2024  9:14 AM EST  Reason for Triage: Patient fell outside two days ago on ice- feel on right knee/ turned backwards- extremely painful also sneezing a lot   Reason for Disposition  MILD weakness (e.g., does not interfere with ability to work, go to school, normal activities)  (Exception: Mild weakness is a chronic symptom.)  Answer Assessment - Initial Assessment Questions Slipped on ice 2 days ago- twisted right knee and aggrivated her right sciatica pain. Gabapentin  is not effective at baseline- only makes her sleep. 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 Pain worse with activity.   Pelvic bone flare- tender at the top of her pelvis Left leg felt weaker than right for months- has been working out and strengthening it. PT that [facility] sent her to said there is something in her spine pressing on her nerves/spine.   Sneezing a lot- denies cough, congestion, SOB.   Appt with [facility] to assess injury from fall. Understands ED precautions.   1. MECHANISM: How did the fall happen?     Slipped on ice 3. ONSET: When did the fall happen? (e.g., minutes, hours, or days ago)     2 days ago  4. LOCATION: What part of the body hit the ground? (e.g., back, buttocks, head, hips, knees, hands, head, stomach)     Right knee twisted 5. INJURY: Did you hurt (injure) yourself when you fell? If Yes, ask: What did you injure? Tell  me more about this? (e.g., body area; type of injury; pain severity)     Twisted right knee and aggravated right sided sciatica 6. PAIN: Is there any pain? If Yes, ask: How bad is the pain? (e.g., Scale 0-10; or none, mild,      8/10 7. SIZE: For cuts, bruises, or swelling, ask: How large is it? (e.g., inches or centimeters)      Denies  9. OTHER SYMPTOMS: Do you have any other symptoms? (e.g., dizziness, fever, weakness; new-onset or worsening).      Sciatica flare  10. CAUSE: What do you think caused the fall (or falling)? (e.g., dizzy spell, tripped)       slipped  Protocols used: Falls and [facility]

## 2024-10-14 ENCOUNTER — Ambulatory Visit: Payer: Self-pay

## 2024-10-14 NOTE — Telephone Encounter (Signed)
 FYI Only or Action Required?: Action required by provider: update on patient condition and advise on sooner work in or pain medication that can be offered/XR needed?? .  Patient was last seen in primary care on 10/08/2024 by Mercer Clotilda SAUNDERS, MD.  Called Nurse Triage reporting Back Pain.  Symptoms began a week ago.  Interventions attempted: Prescription medications: tizanidine , Gabapentin , morphine  and Rest, hydration, or home remedies.  Symptoms are: gradually worsening.  Triage Disposition: See HCP Within 4 Hours (Or PCP Triage)  Patient/caregiver understands and will follow disposition?: Yes  Message from Tiffini S sent at 10/14/2024 11:43 AM EST  Reason for Triage: Patient was prescribed medication for muscle spasms for the nerve on the right side of her back- fell on ice last Tuesday  Medications are not working and is causing very bad headaches which started on the first she took the medicine   Reason for Disposition  [1] SEVERE back pain (e.g., excruciating, unable to do any normal activities) AND [2] not improved 2 hours after pain medicine  Answer Assessment - Initial Assessment Questions Pt fell on the ice last Tuesday and aggrevated her right sciatica and twisted her knee. Knee feeling mostly better just sore. Acute visit 1/30 to assess and was advised didn't need XR at the time, and given muscle relaxer for back spasms.  Took Zanaflex  when she got home and within an hour she had bad migraine that lasted all night. Will not take anymore of the medication.   Wants someone to grab right in there and pull it out the nerve. 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 Denies SI/HI. Discussed 988. Just tired of being in pain all the time   Gabapentin - stopped working  Tizinidine- first pill gave her migraine-  Tylenol  and ibuprofen - no relief Has been advised  need for pain management at one point Tried a 1/2 tablet of friends morphine  and all her pain was resolved.   Patient advised from previous Acute appt that needs to see PCP for FU and further assessment. Scheduled 2/17 and placed on waitlist- please advise if sooner work in appt can be found or if patient can be prescribed something else for her pain. Understands Morphine  not an option and shouldn't be taking other meds.    1. ONSET: When did the pain begin? (e.g., minutes, hours, days)     Last Tuesday  2. LOCATION: Where does it hurt? (upper, mid or lower back)     Right lower back/hip- sciatica  3. SEVERITY: How bad is the pain?  (e.g., Scale 1-10; mild, moderate, or severe)     8-10/10 4. PATTERN: Is the pain constant? 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 RADIATION: Does the pain shoot into your legs or somewhere else?     Down the right leg 6. CAUSE:  What do you think is causing the back pain?      Sciatica s/p fall  7. BACK OVERUSE:  Any recent lifting of heavy objects, strenuous work or exercise?     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 MEDICINES: What have you taken so far for the pain? (e.g., nothing, acetaminophen , NSAIDS)     Tizanidine   9. NEUROLOGIC SYMPTOMS: Do you have any weakness, numbness,  or problems with bowel/bladder control?     Numbness down the back of her right leg  10. OTHER SYMPTOMS: Do you have any other symptoms? (e.g., fever, abdomen pain, burning with urination, blood in urine)       Numbness/tingling down the back of her right leg.  Protocols used: Back Pain-A-[facility]

## 2024-10-26 ENCOUNTER — Ambulatory Visit (HOSPITAL_BASED_OUTPATIENT_CLINIC_OR_DEPARTMENT_OTHER): Admitting: Family Medicine
